# Patient Record
Sex: Male | Born: 1935 | Race: Black or African American | Hispanic: No | Marital: Married | Smoking: Former smoker
Health system: Southern US, Community
[De-identification: ages and names within clinical notes are randomized; demographics above are authoritative.]

## PROBLEM LIST (undated history)

## (undated) DIAGNOSIS — Z86010 Personal history of colonic polyps: Secondary | ICD-10-CM

## (undated) DIAGNOSIS — R4189 Other symptoms and signs involving cognitive functions and awareness: Secondary | ICD-10-CM

## (undated) DIAGNOSIS — R339 Retention of urine, unspecified: Secondary | ICD-10-CM

## (undated) DIAGNOSIS — K7689 Other specified diseases of liver: Secondary | ICD-10-CM

## (undated) DIAGNOSIS — R7989 Other specified abnormal findings of blood chemistry: Secondary | ICD-10-CM

## (undated) DIAGNOSIS — G4730 Sleep apnea, unspecified: Secondary | ICD-10-CM

## (undated) DIAGNOSIS — K824 Cholesterolosis of gallbladder: Secondary | ICD-10-CM

## (undated) DIAGNOSIS — I4510 Unspecified right bundle-branch block: Secondary | ICD-10-CM

## (undated) DIAGNOSIS — H269 Unspecified cataract: Secondary | ICD-10-CM

## (undated) DIAGNOSIS — E669 Obesity, unspecified: Secondary | ICD-10-CM

## (undated) DIAGNOSIS — K648 Other hemorrhoids: Secondary | ICD-10-CM

## (undated) DIAGNOSIS — A4150 Gram-negative sepsis, unspecified: Secondary | ICD-10-CM

## (undated) DIAGNOSIS — N400 Enlarged prostate without lower urinary tract symptoms: Secondary | ICD-10-CM

## (undated) DIAGNOSIS — N390 Urinary tract infection, site not specified: Secondary | ICD-10-CM

## (undated) DIAGNOSIS — I861 Scrotal varices: Secondary | ICD-10-CM

## (undated) DIAGNOSIS — F0390 Unspecified dementia without behavioral disturbance: Secondary | ICD-10-CM

## (undated) HISTORY — DX: Other symptoms and signs involving cognitive functions and awareness: R41.89

## (undated) HISTORY — PX: GLAUCOMA REPAIR: SHX214

## (undated) HISTORY — DX: Obesity, unspecified: E66.9

## (undated) HISTORY — DX: Gram-negative sepsis, unspecified: A41.50

## (undated) HISTORY — DX: Other specified abnormal findings of blood chemistry: R79.89

## (undated) HISTORY — PX: KNEE SURGERY: SHX244

## (undated) HISTORY — DX: Retention of urine, unspecified: R33.9

## (undated) HISTORY — DX: Unspecified cataract: H26.9

## (undated) HISTORY — DX: Cholesterolosis of gallbladder: K82.4

## (undated) HISTORY — DX: Other specified diseases of liver: K76.89

## (undated) HISTORY — PX: CATARACT EXTRACTION: SUR2

## (undated) HISTORY — DX: Sleep apnea, unspecified: G47.30

## (undated) HISTORY — DX: Unspecified right bundle-branch block: I45.10

## (undated) HISTORY — DX: Benign prostatic hyperplasia without lower urinary tract symptoms: N40.0

## (undated) HISTORY — DX: Unspecified dementia, unspecified severity, without behavioral disturbance, psychotic disturbance, mood disturbance, and anxiety: F03.90

## (undated) HISTORY — DX: Personal history of colonic polyps: Z86.010

## (undated) HISTORY — DX: Scrotal varices: I86.1

## (undated) HISTORY — DX: Urinary tract infection, site not specified: N39.0

## (undated) HISTORY — DX: Other hemorrhoids: K64.8

---

## 2004-04-22 ENCOUNTER — Ambulatory Visit: Payer: Self-pay | Admitting: Internal Medicine

## 2004-11-15 ENCOUNTER — Ambulatory Visit: Payer: Self-pay | Admitting: Internal Medicine

## 2004-12-29 ENCOUNTER — Ambulatory Visit: Payer: Self-pay | Admitting: Internal Medicine

## 2005-01-19 ENCOUNTER — Ambulatory Visit: Payer: Self-pay | Admitting: Internal Medicine

## 2005-02-09 ENCOUNTER — Ambulatory Visit: Payer: Self-pay | Admitting: Internal Medicine

## 2005-02-24 ENCOUNTER — Encounter (INDEPENDENT_AMBULATORY_CARE_PROVIDER_SITE_OTHER): Payer: Self-pay | Admitting: *Deleted

## 2005-02-24 ENCOUNTER — Ambulatory Visit: Payer: Self-pay | Admitting: Internal Medicine

## 2005-03-24 ENCOUNTER — Ambulatory Visit: Payer: Self-pay | Admitting: Internal Medicine

## 2005-06-23 ENCOUNTER — Ambulatory Visit: Payer: Self-pay | Admitting: Internal Medicine

## 2006-05-11 ENCOUNTER — Ambulatory Visit: Payer: Self-pay | Admitting: Internal Medicine

## 2006-05-11 LAB — CONVERTED CEMR LAB
ALT: 20 units/L (ref 0–40)
AST: 24 units/L (ref 0–37)
Albumin: 3.7 g/dL (ref 3.5–5.2)
Alkaline Phosphatase: 121 units/L — ABNORMAL HIGH (ref 39–117)
BUN: 15 mg/dL (ref 6–23)
Basophils Absolute: 0 10*3/uL (ref 0.0–0.1)
Basophils Relative: 0.2 % (ref 0.0–1.0)
Bilirubin Urine: NEGATIVE
CO2: 30 meq/L (ref 19–32)
Calcium: 9.2 mg/dL (ref 8.4–10.5)
Chloride: 105 meq/L (ref 96–112)
Chol/HDL Ratio, serum: 3.2
Cholesterol: 192 mg/dL (ref 0–200)
Creatinine, Ser: 1.2 mg/dL (ref 0.4–1.5)
Eosinophil percent: 2.1 % (ref 0.0–5.0)
GFR calc non Af Amer: 62 mL/min
Glomerular Filtration Rate, Af Am: 75 mL/min/{1.73_m2}
Glucose, Bld: 91 mg/dL (ref 70–99)
HCT: 39.3 % (ref 39.0–52.0)
HDL: 59.8 mg/dL (ref >39.0)
Hemoglobin, Urine: NEGATIVE
Hemoglobin: 13.2 g/dL (ref 13.0–17.0)
Ketones, ur: NEGATIVE mg/dL
LDL Cholesterol: 120 mg/dL — ABNORMAL HIGH (ref 0–99)
Leukocytes, UA: NEGATIVE
Lymphocytes Relative: 38.8 % (ref 12.0–46.0)
MCHC: 33.6 g/dL (ref 30.0–36.0)
MCV: 92.3 fL (ref 78.0–100.0)
Monocytes Absolute: 0.4 10*3/uL (ref 0.2–0.7)
Monocytes Relative: 11.4 % — ABNORMAL HIGH (ref 3.0–11.0)
Neutro Abs: 1.8 10*3/uL (ref 1.4–7.7)
Neutrophils Relative %: 47.5 % (ref 43.0–77.0)
Nitrite: NEGATIVE
PSA: 1.86 ng/mL (ref 0.10–4.00)
Platelets: 181 10*3/uL (ref 150–400)
Potassium: 4.7 meq/L (ref 3.5–5.1)
RBC: 4.26 M/uL (ref 4.22–5.81)
RDW: 12.4 % (ref 11.5–14.6)
Sodium: 141 meq/L (ref 135–145)
Specific Gravity, Urine: 1.01 (ref 1.000–1.03)
TSH: 1.38 microintl units/mL (ref 0.35–5.50)
Total Bilirubin: 1 mg/dL (ref 0.3–1.2)
Total Protein, Urine: NEGATIVE mg/dL
Total Protein: 7 g/dL (ref 6.0–8.3)
Triglyceride fasting, serum: 61 mg/dL (ref 0–149)
Urine Glucose: NEGATIVE mg/dL
Urobilinogen, UA: 0.2 (ref 0.0–1.0)
VLDL: 12 mg/dL (ref 0–40)
WBC: 3.8 10*3/uL — ABNORMAL LOW (ref 4.5–10.5)
pH: 6.5 (ref 5.0–8.0)

## 2006-07-20 ENCOUNTER — Ambulatory Visit: Payer: Self-pay | Admitting: Internal Medicine

## 2006-07-20 LAB — CONVERTED CEMR LAB
Folate: 12.6 ng/mL
Vitamin B-12: 264 pg/mL (ref 211–911)

## 2006-09-21 ENCOUNTER — Encounter: Admission: RE | Admit: 2006-09-21 | Discharge: 2006-09-21 | Payer: Self-pay | Admitting: *Deleted

## 2006-10-12 ENCOUNTER — Ambulatory Visit: Payer: Self-pay | Admitting: Internal Medicine

## 2007-03-22 ENCOUNTER — Ambulatory Visit: Payer: Self-pay | Admitting: Internal Medicine

## 2007-05-17 ENCOUNTER — Ambulatory Visit: Payer: Self-pay | Admitting: Internal Medicine

## 2007-05-17 LAB — CONVERTED CEMR LAB
ALT: 15 units/L (ref 0–53)
AST: 24 units/L (ref 0–37)
Albumin: 3.7 g/dL (ref 3.5–5.2)
Alkaline Phosphatase: 136 units/L — ABNORMAL HIGH (ref 39–117)
BUN: 10 mg/dL (ref 6–23)
Basophils Absolute: 0 10*3/uL (ref 0.0–0.1)
Basophils Relative: 1 % (ref 0.0–1.0)
Bilirubin Urine: NEGATIVE
CO2: 32 meq/L (ref 19–32)
CRP, High Sensitivity: <1 — ABNORMAL LOW (ref 0.00–5.00)
Calcium: 9.5 mg/dL (ref 8.4–10.5)
Chloride: 105 meq/L (ref 96–112)
Cholesterol: 205 mg/dL (ref 0–200)
Creatinine, Ser: 1.1 mg/dL (ref 0.4–1.5)
Direct LDL: 124.3 mg/dL
Eosinophils Absolute: 0.1 10*3/uL (ref 0.0–0.6)
Eosinophils Relative: 2.9 % (ref 0.0–5.0)
GFR calc Af Amer: 83 mL/min
GFR calc non Af Amer: 69 mL/min
Glucose, Bld: 99 mg/dL (ref 70–99)
HCT: 38.2 % — ABNORMAL LOW (ref 39.0–52.0)
HDL: 59.2 mg/dL (ref >39.0)
Hemoglobin, Urine: NEGATIVE
Hemoglobin: 12.9 g/dL — ABNORMAL LOW (ref 13.0–17.0)
Ketones, ur: NEGATIVE mg/dL
Leukocytes, UA: NEGATIVE
Lymphocytes Relative: 35.5 % (ref 12.0–46.0)
MCHC: 33.9 g/dL (ref 30.0–36.0)
MCV: 93.4 fL (ref 78.0–100.0)
Monocytes Absolute: 0.2 10*3/uL (ref 0.2–0.7)
Monocytes Relative: 7.1 % (ref 3.0–11.0)
Neutro Abs: 2 10*3/uL (ref 1.4–7.7)
Neutrophils Relative %: 53.5 % (ref 43.0–77.0)
Nitrite: NEGATIVE
Platelets: 160 10*3/uL (ref 150–400)
Potassium: 4.6 meq/L (ref 3.5–5.1)
RBC: 4.09 M/uL — ABNORMAL LOW (ref 4.22–5.81)
RDW: 12.4 % (ref 11.5–14.6)
Sodium: 142 meq/L (ref 135–145)
Specific Gravity, Urine: 1.015 (ref 1.000–1.03)
TSH: 1.35 microintl units/mL (ref 0.35–5.50)
Total Bilirubin: 0.9 mg/dL (ref 0.3–1.2)
Total CHOL/HDL Ratio: 3.5
Total Protein, Urine: NEGATIVE mg/dL
Total Protein: 7 g/dL (ref 6.0–8.3)
Triglycerides: 50 mg/dL (ref 0–149)
Urine Glucose: NEGATIVE mg/dL
Urobilinogen, UA: 0.2 (ref 0.0–1.0)
VLDL: 10 mg/dL (ref 0–40)
WBC: 3.5 10*3/uL — ABNORMAL LOW (ref 4.5–10.5)
pH: 6.5 (ref 5.0–8.0)

## 2007-06-04 ENCOUNTER — Telehealth (INDEPENDENT_AMBULATORY_CARE_PROVIDER_SITE_OTHER): Payer: Self-pay | Admitting: *Deleted

## 2007-06-11 ENCOUNTER — Encounter: Payer: Self-pay | Admitting: Internal Medicine

## 2007-06-11 DIAGNOSIS — D126 Benign neoplasm of colon, unspecified: Secondary | ICD-10-CM

## 2007-06-11 DIAGNOSIS — N400 Enlarged prostate without lower urinary tract symptoms: Secondary | ICD-10-CM

## 2007-06-11 DIAGNOSIS — H409 Unspecified glaucoma: Secondary | ICD-10-CM

## 2007-06-11 DIAGNOSIS — K5730 Diverticulosis of large intestine without perforation or abscess without bleeding: Secondary | ICD-10-CM

## 2007-06-11 DIAGNOSIS — N401 Enlarged prostate with lower urinary tract symptoms: Secondary | ICD-10-CM

## 2007-06-11 DIAGNOSIS — F6811 Factitious disorder with predominantly psychological signs and symptoms: Secondary | ICD-10-CM

## 2007-06-11 DIAGNOSIS — I861 Scrotal varices: Secondary | ICD-10-CM

## 2007-06-11 DIAGNOSIS — I4510 Unspecified right bundle-branch block: Secondary | ICD-10-CM

## 2007-06-11 DIAGNOSIS — E669 Obesity, unspecified: Secondary | ICD-10-CM | POA: Insufficient documentation

## 2007-06-11 HISTORY — DX: Unspecified glaucoma: H40.9

## 2007-06-11 HISTORY — DX: Benign prostatic hyperplasia with lower urinary tract symptoms: N40.1

## 2007-06-11 HISTORY — DX: Unspecified right bundle-branch block: I45.10

## 2007-06-11 HISTORY — DX: Scrotal varices: I86.1

## 2007-06-11 HISTORY — DX: Benign neoplasm of colon, unspecified: D12.6

## 2007-06-11 HISTORY — DX: Factitious disorder imposed on self, with predominantly psychological signs and symptoms: F68.11

## 2007-06-11 HISTORY — DX: Diverticulosis of large intestine without perforation or abscess without bleeding: K57.30

## 2007-12-20 ENCOUNTER — Ambulatory Visit: Payer: Self-pay | Admitting: Internal Medicine

## 2007-12-20 LAB — CONVERTED CEMR LAB
Basophils Absolute: 0 10*3/uL (ref 0.0–0.1)
Basophils Relative: 0.6 % (ref 0.0–1.0)
Eosinophils Absolute: 0.1 10*3/uL (ref 0.0–0.7)
Eosinophils Relative: 2.2 % (ref 0.0–5.0)
HCT: 38.2 % — ABNORMAL LOW (ref 39.0–52.0)
Hemoglobin: 12.6 g/dL — ABNORMAL LOW (ref 13.0–17.0)
Lymphocytes Relative: 33.1 % (ref 12.0–46.0)
MCHC: 33 g/dL (ref 30.0–36.0)
MCV: 94.2 fL (ref 78.0–100.0)
Monocytes Absolute: 0.4 10*3/uL (ref 0.1–1.0)
Monocytes Relative: 11.6 % (ref 3.0–12.0)
Neutro Abs: 2 10*3/uL (ref 1.4–7.7)
Neutrophils Relative %: 52.5 % (ref 43.0–77.0)
Platelets: 167 10*3/uL (ref 150–400)
RBC: 4.06 M/uL — ABNORMAL LOW (ref 4.22–5.81)
RDW: 12.8 % (ref 11.5–14.6)
Vitamin B-12: 248 pg/mL (ref 211–911)
WBC: 3.8 10*3/uL — ABNORMAL LOW (ref 4.5–10.5)

## 2008-03-11 ENCOUNTER — Ambulatory Visit: Payer: Self-pay | Admitting: Pulmonary Disease

## 2008-05-22 ENCOUNTER — Ambulatory Visit: Payer: Self-pay | Admitting: Internal Medicine

## 2008-11-20 ENCOUNTER — Ambulatory Visit: Payer: Self-pay | Admitting: Internal Medicine

## 2008-11-20 DIAGNOSIS — H9190 Unspecified hearing loss, unspecified ear: Secondary | ICD-10-CM

## 2008-11-20 HISTORY — DX: Unspecified hearing loss, unspecified ear: H91.90

## 2009-03-26 ENCOUNTER — Ambulatory Visit: Payer: Self-pay | Admitting: Internal Medicine

## 2010-01-18 ENCOUNTER — Encounter (INDEPENDENT_AMBULATORY_CARE_PROVIDER_SITE_OTHER): Payer: Self-pay | Admitting: *Deleted

## 2010-02-03 ENCOUNTER — Ambulatory Visit: Payer: Self-pay | Admitting: Internal Medicine

## 2010-02-03 DIAGNOSIS — R5381 Other malaise: Secondary | ICD-10-CM

## 2010-02-03 DIAGNOSIS — R5383 Other fatigue: Secondary | ICD-10-CM

## 2010-02-03 HISTORY — DX: Other malaise: R53.81

## 2010-02-03 HISTORY — DX: Other fatigue: R53.83

## 2010-02-24 ENCOUNTER — Ambulatory Visit: Payer: Self-pay | Admitting: Internal Medicine

## 2010-02-24 ENCOUNTER — Encounter: Payer: Self-pay | Admitting: Internal Medicine

## 2010-02-24 LAB — CONVERTED CEMR LAB
ALT: 15 units/L (ref 0–53)
AST: 25 units/L (ref 0–37)
Albumin: 3.9 g/dL (ref 3.5–5.2)
Alkaline Phosphatase: 147 units/L — ABNORMAL HIGH (ref 39–117)
BUN: 18 mg/dL (ref 6–23)
Basophils Absolute: 0 10*3/uL (ref 0.0–0.1)
Basophils Relative: 0.6 % (ref 0.0–3.0)
Bilirubin Urine: NEGATIVE
Bilirubin, Direct: 0.1 mg/dL (ref 0.0–0.3)
CO2: 31 meq/L (ref 19–32)
Calcium: 9 mg/dL (ref 8.4–10.5)
Chloride: 104 meq/L (ref 96–112)
Cholesterol: 183 mg/dL (ref 0–200)
Creatinine, Ser: 1.1 mg/dL (ref 0.4–1.5)
Eosinophils Absolute: 0.1 10*3/uL (ref 0.0–0.7)
Eosinophils Relative: 1.8 % (ref 0.0–5.0)
GFR calc non Af Amer: 80.75 mL/min (ref >60)
Glucose, Bld: 86 mg/dL (ref 70–99)
HCT: 36.8 % — ABNORMAL LOW (ref 39.0–52.0)
HDL: 63.5 mg/dL (ref >39.00)
Hemoglobin, Urine: NEGATIVE
Hemoglobin: 12.4 g/dL — ABNORMAL LOW (ref 13.0–17.0)
Ketones, ur: NEGATIVE mg/dL
LDL Cholesterol: 113 mg/dL — ABNORMAL HIGH (ref 0–99)
Leukocytes, UA: NEGATIVE
Lymphocytes Relative: 28.3 % (ref 12.0–46.0)
Lymphs Abs: 1.2 10*3/uL (ref 0.7–4.0)
MCHC: 33.6 g/dL (ref 30.0–36.0)
MCV: 94.3 fL (ref 78.0–100.0)
Monocytes Absolute: 0.5 10*3/uL (ref 0.1–1.0)
Monocytes Relative: 12.9 % — ABNORMAL HIGH (ref 3.0–12.0)
Neutro Abs: 2.3 10*3/uL (ref 1.4–7.7)
Neutrophils Relative %: 56.4 % (ref 43.0–77.0)
Nitrite: NEGATIVE
Platelets: 157 10*3/uL (ref 150.0–400.0)
Potassium: 4.1 meq/L (ref 3.5–5.1)
RBC: 3.9 M/uL — ABNORMAL LOW (ref 4.22–5.81)
RDW: 13.9 % (ref 11.5–14.6)
Sodium: 141 meq/L (ref 135–145)
Specific Gravity, Urine: 1.025 (ref 1.000–1.030)
TSH: 2.06 microintl units/mL (ref 0.35–5.50)
Total Bilirubin: 0.7 mg/dL (ref 0.3–1.2)
Total CHOL/HDL Ratio: 3
Total Protein, Urine: NEGATIVE mg/dL
Total Protein: 6.8 g/dL (ref 6.0–8.3)
Triglycerides: 33 mg/dL (ref 0.0–149.0)
Urine Glucose: NEGATIVE mg/dL
Urobilinogen, UA: 0.2 (ref 0.0–1.0)
VLDL: 6.6 mg/dL (ref 0.0–40.0)
WBC: 4.1 10*3/uL — ABNORMAL LOW (ref 4.5–10.5)
pH: 5.5 (ref 5.0–8.0)

## 2010-04-13 ENCOUNTER — Encounter (INDEPENDENT_AMBULATORY_CARE_PROVIDER_SITE_OTHER): Payer: Self-pay | Admitting: *Deleted

## 2010-06-03 DIAGNOSIS — Z860100 Personal history of colon polyps, unspecified: Secondary | ICD-10-CM

## 2010-06-03 HISTORY — DX: Personal history of colon polyps, unspecified: Z86.0100

## 2010-06-03 HISTORY — DX: Personal history of colonic polyps: Z86.010

## 2010-06-14 ENCOUNTER — Encounter: Payer: Self-pay | Admitting: Internal Medicine

## 2010-06-14 ENCOUNTER — Other Ambulatory Visit: Payer: Self-pay

## 2010-06-14 ENCOUNTER — Ambulatory Visit
Admission: RE | Admit: 2010-06-14 | Discharge: 2010-06-14 | Payer: Self-pay | Source: Home / Self Care | Attending: Internal Medicine | Admitting: Internal Medicine

## 2010-06-14 DIAGNOSIS — R932 Abnormal findings on diagnostic imaging of liver and biliary tract: Secondary | ICD-10-CM | POA: Insufficient documentation

## 2010-06-14 DIAGNOSIS — K649 Unspecified hemorrhoids: Secondary | ICD-10-CM

## 2010-06-14 HISTORY — DX: Unspecified hemorrhoids: K64.9

## 2010-06-14 HISTORY — DX: Abnormal findings on diagnostic imaging of liver and biliary tract: R93.2

## 2010-06-14 LAB — IBC PANEL
Iron: 61 ug/dL (ref 42–165)
Saturation Ratios: 18.9 % — ABNORMAL LOW (ref 20.0–50.0)
Transferrin: 230.8 mg/dL (ref 212.0–360.0)

## 2010-06-14 LAB — ALKALINE PHOSPHATASE: Alkaline Phosphatase: 152 U/L — ABNORMAL HIGH (ref 39–117)

## 2010-06-14 LAB — GAMMA GT: GGT: 35 U/L (ref 7–51)

## 2010-06-15 LAB — PSA: PSA: 1.06 ng/mL (ref 0.10–4.00)

## 2010-06-15 LAB — VITAMIN B12: Vitamin B-12: 304 pg/mL (ref 211–911)

## 2010-06-20 ENCOUNTER — Encounter: Payer: Self-pay | Admitting: Internal Medicine

## 2010-06-21 ENCOUNTER — Ambulatory Visit
Admission: RE | Admit: 2010-06-21 | Discharge: 2010-06-21 | Payer: Self-pay | Source: Home / Self Care | Attending: Gastroenterology | Admitting: Gastroenterology

## 2010-06-21 ENCOUNTER — Ambulatory Visit
Admission: RE | Admit: 2010-06-21 | Discharge: 2010-06-21 | Payer: Self-pay | Source: Home / Self Care | Attending: Internal Medicine | Admitting: Internal Medicine

## 2010-06-21 ENCOUNTER — Encounter: Payer: Self-pay | Admitting: Internal Medicine

## 2010-06-24 ENCOUNTER — Telehealth (INDEPENDENT_AMBULATORY_CARE_PROVIDER_SITE_OTHER): Payer: Self-pay | Admitting: *Deleted

## 2010-06-24 LAB — CONVERTED CEMR LAB: Tissue Transglutaminase Ab, IgA: 8.3 units (ref <20)

## 2010-06-27 ENCOUNTER — Encounter: Payer: Self-pay | Admitting: Internal Medicine

## 2010-07-06 ENCOUNTER — Ambulatory Visit
Admission: RE | Admit: 2010-07-06 | Discharge: 2010-07-06 | Payer: Self-pay | Source: Home / Self Care | Attending: Internal Medicine | Admitting: Internal Medicine

## 2010-07-06 DIAGNOSIS — E538 Deficiency of other specified B group vitamins: Secondary | ICD-10-CM

## 2010-07-06 HISTORY — DX: Deficiency of other specified B group vitamins: E53.8

## 2010-07-10 LAB — CONVERTED CEMR LAB
ALT: 12 units/L (ref 0–53)
AST: 22 units/L (ref 0–37)
Albumin: 3.6 g/dL (ref 3.5–5.2)
Alkaline Phosphatase: 138 units/L — ABNORMAL HIGH (ref 39–117)
BUN: 15 mg/dL (ref 6–23)
Basophils Absolute: 0 10*3/uL (ref 0.0–0.1)
Basophils Relative: 1.3 % (ref 0.0–3.0)
Bilirubin Urine: NEGATIVE
Bilirubin, Direct: 0.1 mg/dL (ref 0.0–0.3)
CO2: 32 meq/L (ref 19–32)
CRP, High Sensitivity: <1 — ABNORMAL LOW (ref 0.00–5.00)
Calcium: 9.1 mg/dL (ref 8.4–10.5)
Chloride: 104 meq/L (ref 96–112)
Cholesterol: 211 mg/dL (ref 0–200)
Creatinine, Ser: 1.2 mg/dL (ref 0.4–1.5)
Direct LDL: 117 mg/dL
Eosinophils Absolute: 0.1 10*3/uL (ref 0.0–0.7)
Eosinophils Relative: 2.8 % (ref 0.0–5.0)
GFR calc Af Amer: 75 mL/min
GFR calc non Af Amer: 62 mL/min
Glucose, Bld: 91 mg/dL (ref 70–99)
HCT: 39.1 % (ref 39.0–52.0)
HDL: 72 mg/dL (ref >39.0)
Hemoglobin, Urine: NEGATIVE
Hemoglobin: 13 g/dL (ref 13.0–17.0)
Ketones, ur: NEGATIVE mg/dL
Leukocytes, UA: NEGATIVE
Lymphocytes Relative: 41.4 % (ref 12.0–46.0)
MCHC: 33.3 g/dL (ref 30.0–36.0)
MCV: 94 fL (ref 78.0–100.0)
Monocytes Absolute: 0.4 10*3/uL (ref 0.1–1.0)
Monocytes Relative: 13.3 % — ABNORMAL HIGH (ref 3.0–12.0)
Neutro Abs: 1.1 10*3/uL — ABNORMAL LOW (ref 1.4–7.7)
Neutrophils Relative %: 41.2 % — ABNORMAL LOW (ref 43.0–77.0)
Nitrite: NEGATIVE
Platelets: 150 10*3/uL (ref 150–400)
Potassium: 4.3 meq/L (ref 3.5–5.1)
RBC: 4.15 M/uL — ABNORMAL LOW (ref 4.22–5.81)
RDW: 12.9 % (ref 11.5–14.6)
Sodium: 140 meq/L (ref 135–145)
Specific Gravity, Urine: 1.02 (ref 1.000–1.03)
TSH: 2.28 microintl units/mL (ref 0.35–5.50)
Total Bilirubin: 1 mg/dL (ref 0.3–1.2)
Total CHOL/HDL Ratio: 2.9
Total Protein, Urine: NEGATIVE mg/dL
Total Protein: 6.6 g/dL (ref 6.0–8.3)
Triglycerides: 45 mg/dL (ref 0–149)
Urine Glucose: NEGATIVE mg/dL
Urobilinogen, UA: 0.2 (ref 0.0–1.0)
VLDL: 9 mg/dL (ref 0–40)
WBC: 2.7 10*3/uL — ABNORMAL LOW (ref 4.5–10.5)
pH: 6.5 (ref 5.0–8.0)

## 2010-07-12 NOTE — Assessment & Plan Note (Signed)
Summary: folllow up/ mbw   Visit Type:  Follow-up PCP:  Wert  Chief Complaint:  6 month followup.  Pt denies having complaints..  History of Present Illness:  77 year old black male diagnosed with pseudodementia iin 2006 doing much better over the last year in general in terms of maintaining mental sharpness and memory  per family  wife says doing much better with memory and he denies any complaints at all. Able to tend to his garden fine. knows current events.     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)     OBESITY, MODERATE (ICD-278.00)    BUNDLE BRANCH BLOCK, RIGHT (ICD-426.4)    BENIGN PROSTATIC HYPERTROPHY, HX OF (ICD-V13.8)    VARICOCELE (ICD-456.4)    COLONIC POLYPS (ICD-211.3)    DIVERTICULOSIS OF COLON (ICD-562.10)    GLAUCOMA (ICD-365.9)        Family History:    Ht dz father, 56's    Ca, GI, mother  Social History:    Quit smoking 1958    married     Vital Signs:  Patient Profile:   77 Years Old Male Weight:      161.25 pounds O2 Sat:      96 % O2 treatment:    Room Air Temp:     97.7 degrees F oral Pulse rate:   76 / minute BP sitting:   106 / 64  (left arm)  Vitals Entered By: Vernie Murders (December 20, 2007 10:51 AM)                 Physical Exam    Ambulatory healthy appearing in no acute distress. Afeb with normal vital signs HEENT: nl dentition, turbinates, and orophanx. Nl external ear canals without cough reflex Neck without JVD/Nodes/TM Lungs clear to A and P bilaterally without cough on  insp or exp maneuvers RRR no s3 or murmur or increase in P2 Abd soft and benign with nl excursion in the supine position. No bruits or organomegaly Ext warm without calf tenderness, cyanosis clubbing or edema Skin warm and dry without lesions   neuro without def or pathologic reflexes      Impression & Recommendations:  Problem # 1:  PSEUDODEMENTIA (ICD-300.16) Was told B12 might be alittle low by neurologist but repeat 7/10 wnl so  rec Centrum A-Z  However, appears to be doing great.   F/u CPX 12/09   Orders: TLB-CBC Platelet - w/Differential (85025-CBCD) TLB-B12, Serum-Total ONLY (16109-U04) Est. Patient Level III (54098)    Patient Instructions: 1)  CPX 12/09   ]

## 2010-07-12 NOTE — Letter (Signed)
Summary: Colonoscopy-Changed to Office Visit Letter  [facility]  [address] [place], [place] [address]   Phone: [phone]  Fax: [phone]      January 18, 2010 MRN: [id number]   FARUQ ROSENBERGER [address] [place], [place]  [address]   Dear Mr. RORKE,   According to our records, it is time for you to schedule a Colonoscopy. However, after reviewing your medical record, I feel that an office visit would be most appropriate to more completely evaluate you and determine your need for a repeat procedure.  Please call [phone] (option #2) at your convenience to schedule an office visit. If you have any questions, concerns, or feel that this letter is in error, we would appreciate your call.   Sincerely,  Hedwig Morton. Juanda Chance, M.D.  [facility]astroenterology Division [phone]

## 2010-07-12 NOTE — Assessment & Plan Note (Signed)
Summary: Primary svc/ comprehensive eval    PCP:  Darrin Apodaca  Chief Complaint:  annual physical.......  History of Present Illness:  77 year old black male diagnosed with pseudodementia iin 2006 doing much better over the last year in general in terms of maintaining mental sharpness and memory  per family  wife confirms  much better with memory and he denies any complaints at all. Able to tend to his garden fine. knows current events.  May 22, 2008 co frequent urination good stream, drinking lots of tea late > pos nocturia, no dysuria, hesitance or dribbling.  Pt denies any significant sore throat, nasal congestion or excess secretions, fever, chills, sweats, unintended wt loss, pleuritic or exertional cp, orthopnea pnd or leg swelling.       Updated Prior Medication List: COSOPT 2-0.5 %  SOLN (DORZOLAMIDE-TIMOLOL) two times a day XALATAN 0.005 %  SOLN (LATANOPROST) 1 gtt once daily ALPHAGAN P 0.1 %  SOLN (BRIMONIDINE TARTRATE) 1 gtt two times a day MULTIVITAMINS   TABS (MULTIPLE VITAMIN) once daily ADULT ASPIRIN LOW STRENGTH 81 MG  TBDP (ASPIRIN) once daily CITRUCEL 500 MG  TABS (METHYLCELLULOSE (LAXATIVE)) once daily  Current Allergies: No known allergies   Past Medical History:    PSEUDODEMENTIA (ICD-300.16)     OBESITY, MODERATE (ICD-278.00)    BUNDLE BRANCH BLOCK, RIGHT (ICD-426.4)    BENIGN PROSTATIC HYPERTROPHY, HX OF (ICD-V13.8)    VARICOCELE (ICD-456.4), left    COLONIC POLYPS (ICD-211.3)        - Colonoscopy 02/24/05    DIVERTICULOSIS OF COLON (ICD-562.10)    GLAUCOMA (ICD-365.9)....................................................................Marland KitchenDavanzo, HP    HEALTH MAINTENANCE.....................................................................Marland KitchenWert        - Dt  4/03        - Pneumovax 4/02        - CPX May 22, 2008    Family History:    Reviewed history from 12/20/2007 and no changes required:       Ht dz father, 73's       Ca, GI, mother  Social  History:    Reviewed history from 12/20/2007 and no changes required:       Quit smoking 1958       married    Review of Systems  The patient denies anorexia, fever, weight loss, weight gain, vision loss, decreased hearing, hoarseness, chest pain, syncope, dyspnea on exertion, peripheral edema, prolonged cough, headaches, hemoptysis, abdominal pain, melena, hematochezia, severe indigestion/heartburn, hematuria, incontinence, muscle weakness, suspicious skin lesions, transient blindness, difficulty walking, depression, unusual weight change, abnormal bleeding, enlarged lymph nodes, angioedema, and breast masses.     Vital Signs:  Patient Profile:   77 Years Old Male Height:     68 inches Weight:      170.38 pounds BMI:     26.00 O2 Sat:      99 % O2 treatment:    Room Air Temp:     97.5 degrees F oral Pulse rate:   57 / minute BP sitting:   120 / 70  (left arm) Cuff size:   regular  Pt. in pain?   no  Vitals Entered By: Clarise Cruz Duncan Dull) (May 22, 2008 9:12 AM)                  Physical Exam    Ambulatory healthy appearing bm in no acute distress. Afeb with normal vital signs  wt 161 > 170 05/22/08 HEENT: nl dentition, turbinates, and orophanx. Nl external ear canals without cough reflex Neck without JVD/Nodes/TM Lungs clear  to A and P bilaterally without cough on insp or exp maneuvers RRR no s3 or murmur or increase in P2 Abd soft and benign with nl excursion in the supine position. No bruits or organomegaly Ext warm without calf tenderness, cyanosis clubbing or edema Skin warm and dry without lesions   neuro without def or pathologic reflexes, knows current events, date GU large left vericocele, no IH Rectal mild bph, smooth no nodules, stool guaiac neg    Pulmonary Function Test Height (in.): 68 Gender: Male    Impression & Recommendations:  Problem # 1:  PSEUDODEMENTIA (ICD-300.16) No evidence of cognitive decline,  encouraged to continue to  keep active phyically and mentally   Problem # 2:  BENIGN PROSTATIC HYPERTROPHY, HX OF (ICD-V13.8) no evidence of significant urinary obstruction or renal insufficiency. suspect his nocturia is secondary to too much tea in evenings, try off.   Problem # 3:  BUNDLE BRANCH BLOCK, RIGHT (ICD-426.4)  Benign pattern, no change   Problem # 4:  COLONIC POLYPS (ICD-211.3) in computer for recall   Patient Instructions: 1)  call phone tree next week for lab results 2)  Please schedule a follow-up appointment in 6 months.   ]

## 2010-07-12 NOTE — Letter (Signed)
Summary: New Patient letter  [facility]  [address] [place], [place] [address]   Phone: [phone]  Fax: [phone]       04/13/2010 MRN: [id number]  [name] [address] [place], [place]  [address]  Dear [name],  Welcome to the Gastroenterology Division at [facility].    You are scheduled to see Dr.  Juanda Chance on 06-14-10 at 2:45p.m. on the 3rd floor at [facility], [address]  We ask that you try to arrive at our office 15 minutes prior to your appointment time to allow for check-in.  We would like you to complete the enclosed self-administered evaluation form prior to your visit and bring it with you on the day of your appointment.  We will review it with you.  Also, please bring a complete list of all your medications or, if you prefer, bring the medication bottles and we will list them.  Please bring your insurance card so that we may make a copy of it.  If your insurance requires a referral to see a specialist, please bring your referral form from your primary care physician.  Co-payments are due at the time of your visit and may be paid by cash, check or credit card.     Your office visit will consist of a consult with your physician (includes a physical exam), any laboratory testing he/she may order, scheduling of any necessary diagnostic testing (e.g. x-ray, ultrasound, CT-scan), and scheduling of a procedure (e.g. Endoscopy, Colonoscopy) if required.  Please allow enough time on your schedule to allow for any/all of these possibilities.    If you cannot keep your appointment, please call [phone] to cancel or reschedule prior to your appointment date.  This allows [place] the opportunity to schedule an appointment for another patient in need of care.  If you do not cancel or reschedule by 5 p.m. the business day prior to your appointment date, you will be charged a $50.00 late cancellation/no-show fee.    Thank you for choosing [facility] for your medical needs.  We appreciate the opportunity to care for you.  Please visit [place] at our website  to learn more about our practice.                     Sincerely,                                                             The Gastroenterology Division

## 2010-07-12 NOTE — Assessment & Plan Note (Signed)
Summary: flu shot/ mbw   Flu Vaccine Consent Questions     Do you have a history of severe allergic reactions to this vaccine? no    Any prior history of allergic reactions to egg and/or gelatin? no    Do you have a sensitivity to the preservative Thimersol? no    Do you have a past history of Guillan-Barre Syndrome? no    Do you currently have an acute febrile illness? no    Have you ever had a severe reaction to latex? no    Vaccine information given and explained to patient? yes    Are you currently pregnant? no    Lot Number:[id number]   Site Given  Left Deltoid IM Boone Master CNA  March 16, 2008 6:05 PM     Nurse Visit    Prior Medications: COSOPT 2-0.5 %  SOLN (DORZOLAMIDE-TIMOLOL) two times a day XALATAN 0.005 %  SOLN (LATANOPROST) 1 gtt once daily ALPHAGAN P 0.1 %  SOLN (BRIMONIDINE TARTRATE) 1 gtt two times a day MULTIVITAMINS   TABS (MULTIPLE VITAMIN) once daily ADULT ASPIRIN LOW STRENGTH 81 MG  TBDP (ASPIRIN) once daily CITRUCEL 500 MG  TABS (METHYLCELLULOSE (LAXATIVE)) once daily Current Allergies: No known allergies     Orders Added: 1)  Admin 1st Vaccine [90471] 2)  Flu Vaccine 69yrs + [place].Dew    ]

## 2010-07-12 NOTE — Assessment & Plan Note (Signed)
Summary: Primary svc/  f/u fatigue, start trazadone at hs/ chedule cpx   Primary [name]/Referring [name]:  [name]  CC:  Followup.  [name] poor appetite x 6 months.  Also c/o feeling weak and fatigued. [name]  History of Present Illness: 39 yobm diagnosed with pseudodementia iin 2006  which complelely resolved  November 21, 2008  wife confirms  much better with memory and he denies any complaints at all. Able to tend to his garden fine. knows current events. No tia or claudication, concerned re possible hearing loss, no tinnitus or ear pain or sinus congestion.   February 03, 2010 Followup.  [name] poor appetite x 6 months.  Also c/o feeling weak and fatigued.  Sleeping all day, denies not sleeping well. Pt denies any significant sore throat, dysphagia, itching, sneezing,  nasal congestion or excess secretions,  fever, chills, sweats, unintended wt loss, pleuritic or exertional cp, hempoptysis, change in activity tolerance  orthopnea pnd or leg swelling   Current Medications (verified): 1)  Cosopt 2-0.5 %  Soln (Dorzolamide-Timolol) .... Two Times A Day 2)  Alphagan P 0.1 %  Soln (Brimonidine Tartrate) .[name].. 1 Gtt Two Times A Day  Allergies (verified): No Known Drug Allergies  Past History:  Past Medical History: PSEUDODEMENTIA (ICD-300.16)      - Onset 2006, resolved OBESITY, MODERATE (ICD-278.00) BUNDLE BRANCH BLOCK, RIGHT (ICD-426.4) BENIGN PROSTATIC HYPERTROPHY, HX OF (ICD-V13.8) VARICOCELE (ICD-456.4), left COLONIC POLYPS (ICD-211.3)     - Colonoscopy 02/24/05 DIVERTICULOSIS OF COLON (ICD-562.10) HEARING LOSS    - Audiology eval ordered November 20, 2008 > GLAUCOMA (ICD-365.9)....................................................................Dave KitchenDavanzo, HP HEALTH MAINTENANCE.......................................................................Dave KitchenWert     - Dt  4/03     - Pneumovax 09/2000  (age 89)     - CPX May 22, 2008   Vital Signs:  Patient profile:   77 year old male Weight:       155 pounds BMI:     23.65 O2 Sat:      95 % on Room air Temp:     97.8 degrees F oral Pulse rate:   56 / minute BP sitting:   118 / 70  (left arm)  Vitals Entered By: Vernie Murders (February 03, 2010 10:47 AM)  O2 Flow:  Room air  Physical Exam  Additional Exam:   Ambulatory healthy appearing bm in no acute distress. Afeb with normal vital signs  wt 161 > 170 05/22/08 > 154  November 20, 2008 > 155 February 03, 2010  HEENT: nl dentition, turbinates, and orophanx. Nl external ear canals without cough reflex, nl Marena Chancy and [name] Neck without JVD/Nodes/TM Lungs clear to A and P bilaterally without cough on insp or exp maneuvers RRR no s3 or murmur or increase in P2 Abd soft and benign with nl excursion in the supine position. 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 See instructions for specific recommendations   Medications Added to Medication List This Visit: 1)  Trazodone Hcl 50 Mg Tabs (Trazodone hcl) .... One at bedtime  Other Orders: Est. Patient  Level III (60454) Prescription Created Electronically [phone]) Est. Patient Level III (91478)  Patient Instructions: 1)  Trazadone 50 mg one  at bedtime (  take one half if too strong) 2)  Please schedule a follow-up appointment in 3 weeks, sooner if needed with CPX on return Prescriptions: TRAZODONE HCL 50 MG TABS (TRAZODONE HCL) one at bedtime  #30 x 11   Entered and Authorized by:   Nyoka Cowden MD   Signed by:    Nyoka Cowden MD on 02/03/2010   Method used:   Electronically to        [facility] [phone]* (retail)       [address]       [place], [place]  [address]       Ph: [phone]       Fax: [phone]   RxID:   [id number]

## 2010-07-12 NOTE — Assessment & Plan Note (Signed)
Summary: Primary svc/ better on trazadone   Primary Provider/Referring Provider:  [name]   History of Present Illness: 66 yobm diagnosed with pseudodementia iin 2006  which complelely resolved  November 21, 2008  wife confirms  much better with memory    February 03, 2010 Followup.  [name] poor appetite x 6 months.  Also c/o feeling weak and fatigued.  Sleeping all day, denies not sleeping well.  rec Trazadone 50 mg one  at bedtime (  take one half if too strong)  February 24, 2010 cpx no complaints, sleeping fine, more energy. Pt denies any significant sore throat, dysphagia, itching, sneezing,  nasal congestion or excess secretions,  fever, chills, sweats, unintended wt loss, pleuritic or exertional cp, hempoptysis, change in activity tolerance  orthopnea pnd or leg swelling          Current Medications (verified): 1)  Cosopt 2-0.5 %  Soln (Dorzolamide-Timolol) .... Two Times A Day 2)  Alphagan P 0.1 %  Soln (Brimonidine Tartrate) .Marland Kitchen.. 1 Gtt Two Times A Day 3)  Trazodone Hcl 50 Mg Tabs (Trazodone Hcl) .... One At Bedtime  Allergies (verified): No Known Drug Allergies  Past History:  Past Medical History: PSEUDODEMENTIA (ICD-300.16)      - Onset 2006, resolved OBESITY, MODERATE (ICD-278.00) BUNDLE BRANCH BLOCK, RIGHT (ICD-426.4) BENIGN PROSTATIC HYPERTROPHY, HX OF (ICD-V13.8) VARICOCELE (ICD-456.4), left COLONIC POLYPS (ICD-211.3)     - Colonoscopy 02/24/05 DIVERTICULOSIS OF COLON (ICD-562.10) HEARING LOSS    - Audiology eval ordered November 20, 2008 > GLAUCOMA (ICD-365.9)....................................................................Marland KitchenDavanzo, [facility]INTENA[facility]CE.......................................................................Marland KitchenWert     - Dt  4/03     - Pneumovax 09/2000  (age 22)     - CPX February 24, 2010   Family History: Ht dz father, 66's Ca, GI, mother Kidney dz Brother Has 5 borthers   Social History: Quit smoking 1958 married No  ETOH Retired  Vital Signs:  Patient profile:   77 year old male Weight:      155 pounds O2 Sat:      94 % on Room air Temp:     97.3 degrees F oral Pulse rate:   66 / minute BP sitting:   108 / 66  (left arm)  Vitals Entered By: Vernie Murders (February 24, 2010 8:55 AM)  O2 Flow:  Room air  Physical Exam  Additional Exam:  Ambulatory healthy appearing bm in no acute distress.  wt  170 05/22/08 > 154  November 20, 2008 > 155 February 03, 2010 > 155 February 24, 2010  HEENT: nl dentition, turbinates, and orophanx. Nl external ear canals without cough reflex, nl Marena Chancy and [name] Neck without JVD/Nodes/TM Lungs clear to A and P bilaterally without cough on insp or exp maneuvers RRR no s3 or murmur or increase in P2 Abd soft and benign with nl excursion in the supine position. No bruits or organomegaly Ext warm without calf tenderness, cyanosis clubbing or edema Skin warm and dry without lesions   neuro without def or pathologic reflexes, knows current events, date GU  uncirc, large varicocele Rectal mild/mod bph smooth texture, stool g neg   Cholesterol               183 mg/dL                   1-610     ATP III Classification            Desirable:  < 200 mg/dL  Borderline High:  200 - 239 mg/dL               High:  > = 240 mg/dL   Triglycerides             33.0 mg/dL                  2.1-308.6     Normal:  <150 mg/dL     Borderline High:  578 - 199 mg/dL   HDL                       46.96 mg/dL                 >29.52   VLDL Cholesterol          6.6 mg/dL                   8.4-13.2   LDL Cholesterol      [H]  440 mg/dL                   1-02  CHO/HDL Ratio:  CHD Risk                             3                    Men          Women     1/2 Average Risk     3.4          3.3     Average Risk          5.0          4.4     2X Average Risk          9.6          7.1     3X Average Risk          15.0          11.0                           Tests: (2) BMP  (METABOL)   Sodium                    141 mEq/L                   135-145   Potassium                 4.1 mEq/L                   3.5-5.1   Chloride                  104 mEq/L                   96-112   Carbon Dioxide            31 mEq/L                    19-32   Glucose                   86 mg/dL                    72-53   BUN  18 mg/dL                    1-61   Creatinine                1.1 mg/dL                   0.9-6.0   Calcium                   9.0 mg/dL                   4.5-40.9   GFR                       80.75 mL/min                >60  Tests: (3) CBC Platelet w/Diff (CBCD)   White Cell Count     [L]  4.1 K/uL                    4.5-10.5   Red Cell Count       [L]  3.90 Mil/uL                 4.22-5.81   Hemoglobin           [L]  12.4 g/dL                   81.1-91.4   Hematocrit           [L]  36.8 %                      39.0-52.0   MCV                       94.3 fl                     78.0-100.0   MCHC                      33.6 g/dL                   78.2-95.6   RDW                       13.9 %                      11.5-14.6   Platelet Count            157.0 K/uL                  150.0-400.0   Neutrophil %              56.4 %                      43.0-77.0   Lymphocyte %              28.3 %                      12.0-46.0   Monocyte %           [H]  12.9 %                      3.0-12.0   Eosinophils%  1.8 %                       0.0-5.0   Basophils %               0.6 %                       0.0-3.0   Neutrophill Absolute      2.3 K/uL                    1.4-7.7   Lymphocyte Absolute       1.2 K/uL                    0.7-4.0   Monocyte Absolute         0.5 K/uL                    0.1-1.0  Eosinophils, Absolute                             0.1 K/uL                    0.0-0.7   Basophils Absolute        0.0 K/uL                    0.0-0.1  Tests: (4) Hepatic/Liver Function Panel (HEPATIC)   Total Bilirubin           0.7 mg/dL                    1.6-1.0   Direct Bilirubin          0.1 mg/dL                   9.6-0.4   Alkaline Phosphatase [H]  147 U/L                     39-117   AST                       25 U/L                      0-37   ALT                       15 U/L                      0-53   Total Protein             6.8 g/dL                    5.4-0.9   Albumin                   3.9 g/dL                    8.1-1.9  Tests: (5) TSH (TSH)   FastTSH                   2.06 uIU/mL                 0.35-5.50  Tests: (6) UDip Only (UDIP)   Color  LT. YELLOW       RANGE:  Yellow;Lt. Yellow   Clarity                   CLEAR                       Clear   Specific Gravity          1.025                       1.000 - 1.030   Urine Ph                  5.5                         5.0-8.0   Protein                   NEGATIVE                    Negative   Urine Glucose             NEGATIVE                    Negative   Ketones                   NEGATIVE                    Negative   Urine Bilirubin           NEGATIVE                    Negative   Blood                     NEGATIVE                    Negative   Urobilinogen              0.2                         0.0 - 1.0   Leukocyte Esterace        NEGATIVE                    Negative   Nitrite                   NEGATIVE                    Negative  CXR  Procedure date:  02/24/2010  Findings:      Findings: Mild hyperinflation of the lungs. Heart and mediastinal contours are within normal limits.  No focal opacities or effusions.  No acute bony abnormality.   IMPRESSION: Hyperinflation.  No active disease.  Impression & Recommendations:  Problem # 1:  FATIQUE AND MALAISE (ICD-780.79) Much better on trazadone which is improving sleep hygiene  Problem # 2:  BENIGN PROSTATIC HYPERTROPHY, HX OF (ICD-V13.8) mild to moderate without obst symptoms  Problem # 3:  VARICOCELE (ICD-456.4) no change over the last year, gu eval as needed  Problem # 4:   BUNDLE BRANCH BLOCK, RIGHT (ICD-426.4) no change   Medications Added to Medication List This Visit: 1)  Trazodone Hcl 50 Mg Tabs (Trazodone hcl) .... One half at bedtime  Other Orders: EKG w/ Interpretation (93000) Influenza Vaccine MCR (40981) Est. Patient 65& > (19147) T-2 View CXR (71020TC) TLB-Lipid Panel (80061-LIPID) TLB-BMP (Basic Metabolic Panel-BMET) (80048-METABOL) TLB-CBC Platelet - w/Differential (85025-CBCD) TLB-Hepatic/Liver Function Pnl (80076-HEPATIC) TLB-TSH (Thyroid Stimulating Hormone) (84443-TSH) TLB-Udip ONLY (81003-UDIP)  Patient Instructions: 1)  Stay on trazadone 50 mg one half at bedtime 2)  Call [phone] for your results w/in next 3 days - if there's something important  I feel you need to know,  I'll be in touch with you directly.    Immunizations Administered:  Influenza Vaccine # 1:    Vaccine Type: Fluvax MCR    Site: left deltoid    Mfr: GlaxoSmithKline    Dose: 0.5 ml    Route: IM    Given by: Zackery Barefoot CMA    Exp. Date: 12/10/2010    Lot #: [id number]    VIS given: 01/04/10 version given February 24, 2010.  Flu Vaccine Consent Questions:    Do you have a history of severe allergic reactions to this vaccine? no    Any prior history of allergic reactions to egg and/or gelatin? no    Do you have a sensitivity to the preservative Thimersol? no    Do you have a past history of Guillan-Barre Syndrome? no    Do you currently have an acute febrile illness? no    Have you ever had a severe reaction to latex? no    Vaccine information given and explained to patient? yes

## 2010-07-12 NOTE — Assessment & Plan Note (Signed)
Summary: flu vacc ///kp   Nurse Visit   Allergies: No Known Drug Allergies  Immunizations Administered:  Influenza Vaccine # 1:    Vaccine Type: Fluvax MCR    Site: left deltoid    Mfr: GlaxoSmithKline    Dose: 0.5 ml    Route: IM    Given by: Michel Bickers CMA    Exp. Date: 12/09/2009    Lot #: [id number]    VIS given: 01/03/07 version given March 26, 2009.  Flu Vaccine Consent Questions:    Do you have a history of severe allergic reactions to this vaccine? no    Any prior history of allergic reactions to egg and/or gelatin? no    Do you have a sensitivity to the preservative Thimersol? no    Do you have a past history of Guillan-Barre Syndrome? no    Do you currently have an acute febrile illness? no    Have you ever had a severe reaction to latex? no    Vaccine information given and explained to patient? yes  Orders Added: 1)  Influenza Vaccine MCR [00025]

## 2010-07-12 NOTE — Progress Notes (Signed)
Summary: CALLING FOR LAB RESULTS   Phone Note Call from Patient   Caller: Spouse Call For: WERT Summary of Call: CALLING FOR LAB RESULTS PATIENT'S CHART HAS BEEN REQUESTED  Initial call taken by: Rickard Patience,  June 04, 2007 10:55 AM  Follow-up for Phone Call        RESULTS ARE ON PHONE TREE.  INFO GIVEN TO PT Follow-up by: Vernie Murders,  June 04, 2007 11:48 AM

## 2010-07-14 NOTE — Letter (Signed)
Summary: [facility] Instructions  [facility]  [address] [place], [place] [address]   Phone: [phone]  Fax: [phone]       [name]    Nov 28, 1925    MRN: [id number]       Procedure Day /Date:Tuesday January 10th, 2012     Arrival Time: 12:30pm     Procedure Time: 1:30pm     Location of Procedure:                    _ x_  [facility] (4th Floor)    PREPARATION FOR COLONOSCOPY WITH MIRALAX  Starting 5 days prior to your procedure 06/16/10 do not eat nuts, seeds, popcorn, corn, beans, peas,  salads, or any raw vegetables.  Do not take any fiber supplements (e.g. Metamucil, Citrucel, and Benefiber). ____________________________________________________________________________________________________   THE DAY BEFORE YOUR PROCEDURE         DATE: 06/20/10 DAY: Monday  1   Drink clear liquids the entire day-NO SOLID FOOD  2   Do not drink anything colored red or purple.  Avoid juices with pulp.  No orange juice.  3   Drink at least 64 oz. (8 glasses) of fluid/clear liquids during the day to prevent dehydration and help the prep work efficiently.  CLEAR LIQUIDS INCLUDE: Water Jello Ice Popsicles Tea (sugar ok, no milk/cream) Powdered fruit flavored drinks Coffee (sugar ok, no milk/cream) Gatorade Juice: apple, white grape, white cranberry  Lemonade Clear bullion, consomm, broth Carbonated beverages (any kind) Strained chicken noodle soup Hard Candy  4   Mix the entire bottle of Miralax with 64 oz. of Gatorade/Powerade in the morning and put in the refrigerator to chill.  5   At 3:00 pm take 2 Dulcolax/Bisacodyl tablets.  6   At 4:30 pm take one Reglan/Metoclopramide tablet.  7  Starting at 5:00 pm drink one 8 oz glass of the Miralax mixture every 15-20 minutes until you have finished drinking the entire 64 oz.  You should finish drinking prep around 7:30 or 8:00 pm.  8   If you are nauseated, you may take the 2nd  Reglan/Metoclopramide tablet at 6:30 pm.        9    At 8:00 pm take 2 more DULCOLAX/Bisacodyl tablets.     THE DAY OF YOUR PROCEDURE      DATE:  06/21/10 DAY: Tuesday  You may drink clear liquids until 11:30am  (2 HOURS BEFORE PROCEDURE).   MEDICATION INSTRUCTIONS  Unless otherwise instructed, you should take regular prescription medications with a small sip of water as early as possible the morning of your procedure.        OTHER INSTRUCTIONS  You will need a responsible adult at least 75 years of age to accompany you and drive you home.   This person must remain in the waiting room during your procedure.  Wear loose fitting clothing that is easily removed.  Leave jewelry and other valuables at home.  However, you may wish to bring a book to read or an iPod/MP3 player to listen to music as you wait for your procedure to start.  Remove all body piercing jewelry and leave at home.  Total time from sign-in until discharge is approximately 2-3 hours.  You should go home directly after your procedure and rest.  You can resume normal activities the day after your procedure.  The day of your procedure you should not:   Drive   Make legal decisions  Operate machinery   Drink alcohol   Return to work  You will receive specific instructions about eating, activities and medications before you leave.   The above instructions have been reviewed and explained to me by   _______________________    I fully understand and can verbalize these instructions _____________________________ Date _______

## 2010-07-14 NOTE — Progress Notes (Signed)
   Phone Note Outgoing Call   Call placed by: Jesse Fall, RN Call placed to: Patient Summary of Call: Called patient with lab results. He wants to know does he need to set up for Vitamin B 12 inections? Please, advise. Initial call taken by: Jesse Fall RN,  June 24, 2010 10:26 AM  Follow-up for Phone Call        B12 level 304 pg, low normal. Please start B12 1000ug IM monthly x 6, then repeat B12 level. Follow-up by: Hart Carwin MD,  June 24, 2010 12:44 PM  Additional Follow-up for Phone Call Additional follow up Details #1::        Spoke with patient's wfie scheduled for  B 12 injection on 06/29/10 at 10:15 AM. Repeat labs on 11/21/10 labs in [facility]. Flag to remind me in computer Additional Follow-up by: Jesse Fall RN,  June 24, 2010 2:09 PM

## 2010-07-14 NOTE — Letter (Signed)
Summary: Patient Notice- Polyp Results  [facility]  [address] [place], [place] [address]   Phone: [phone]  Fax: [phone]        June 27, 2010 MRN: [id number]    [name] [address] [place], [place]  [address]    Dear [name],  I am pleased to inform you that the colon polyp(s) removed during your recent colonoscopy was (were) found to be benign (no cancer detected) upon pathologic examination.The polyp was adenomatous .    Should you develop new or worsening symptoms of abdominal pain, bowel habit changes or bleeding from the rectum or bowels, please schedule an evaluation with either your primary care physician or with me.  Additional information/recommendations:  _x_ No further action with gastroenterology is needed at this time. Please      follow-up with your primary care physician for your other healthcare      needs.  __ Please call [phone] to schedule a return visit to review your      situation.  __ Please keep your follow-up visit as already scheduled.  __ Continue treatment plan as outlined the day of your exam.  Please call us if you are having persistent problems or have questions about your condition that have not been fully answered at this time.  Sincerely,  Hart Carwin MD  This letter has been electronically signed by your physician.  Appended Document: Patient Notice- Polyp Results Letter Mailed

## 2010-07-14 NOTE — Assessment & Plan Note (Signed)
Summary: discuss colon//f/u--ch.    History of Present Illness Visit Type: new patient  Primary GI MD: [name] MD Primary Provider: Nyoka Cowden, MD  Requesting Provider: na Chief Complaint: Recall colon. Pt denies any GI complaints  History of Present Illness:   This is an 77 year old African American male who is here to discuss having a recall colonoscopy. He has a positive family history of gastrointestinal cancer in his mother. He has a personal history of villous adenoma and tubular adenoma on his last colonoscopy in September 2006. He is now having normal regular bowel habits without rectal bleeding. Other medical problems include a right bundle branch block, BPH and glaucoma. His appetite has been decreased and he has lost a significant amount of weight. He has early dementia. His wife takes care of all the decisions. She would like patient to have a colonoscopy. He has chronic elevation of alkaline phosphatase of unknown etiology with normal rest of the liver function tests.   GI Review of Systems      Denies abdominal pain, acid reflux, belching, bloating, chest pain, dysphagia with liquids, dysphagia with solids, heartburn, loss of appetite, nausea, vomiting, vomiting blood, weight loss, and  weight gain.         Current Medications (verified): 1)  Cosopt 2-0.5 %  Soln (Dorzolamide-Timolol) .... Two Times A Day 2)  Alphagan P 0.1 %  Soln (Brimonidine Tartrate) .Marland Kitchen.. 1 Gtt Two Times A Day 3)  Trazodone Hcl 50 Mg Tabs (Trazodone Hcl) .... One Half At Bedtime  Allergies (verified): No Known Drug Allergies  Past History:  Past Medical History: PSEUDODEMENTIA (ICD-300.16)      - Onset 2006, resolved OBESITY, MODERATE (ICD-278.00) BUNDLE BRANCH BLOCK, RIGHT (ICD-426.4) BENIGN PROSTATIC HYPERTROPHY, HX OF (ICD-V13.8) VARICOCELE (ICD-456.4), left COLONIC POLYPS (ICD-211.3)     - Colonoscopy 02/24/05 DIVERTICULOSIS OF COLON (ICD-562.10) HEARING LOSS    - Audiology eval  ordered November 20, 2008 > GLAUCOMA (ICD-365.9)....................................................................Marland KitchenDavanzo, [facility]INTENANCE.......................................................................Marland KitchenWert     - Dt  4/03     - Pneumovax 09/2000  (age 72)     - CPX February 24, 2010  Hemorrhoids--Internal   Past Surgical History: Unremarkable  Family History: Ht dz father, 93's Ca,  mother Kidney dz Brother Has 5 brothers  No FH of Colon Cancer:  Social History: Retired Married Childern Patient is a former smoker. -quite in 1958 Alcohol Use - no Illicit Drug Use - no Daily Caffeine Use: 2 daily   Review of Systems       The patient complains of hearing problems.  The patient denies allergy/sinus, anemia, anxiety-new, arthritis/joint pain, back pain, blood in urine, breast changes/lumps, change in vision, confusion, cough, coughing up blood, depression-new, fainting, fatigue, fever, headaches-new, heart murmur, heart rhythm changes, itching, menstrual pain, muscle pains/cramps, night sweats, nosebleeds, pregnancy symptoms, shortness of breath, skin rash, sleeping problems, sore throat, swelling of feet/legs, swollen lymph glands, thirst - excessive, urination - excessive, urination changes/pain, urine leakage, vision changes, and voice change.         Pertinent positive and negative review of systems were noted in the above HPI. All other ROS was otherwise negative.   Vital Signs:  Patient profile:   77 year old male Height:      68 inches Weight:      160 pounds BMI:     24.42 BSA:     1.86 Pulse rate:   60 / minute Pulse rhythm:   regular BP sitting:   120 /  64  (left arm) Cuff size:   regular  Vitals Entered By: Ok Anis CMA (June 14, 2010 3:06 PM)  Physical Exam  General:  Well developed, well nourished, no acute distress. Eyes:  PERRLA, no icterus. Neck:  no bruits. Lungs:  Clear throughout to auscultation. Heart:  Regular rate and rhythm;  no murmurs, rubs,  or bruits. Abdomen:  Soft  relaxed abdomen, nontender. Normoactive bowel sounds. Liver edge at costal margin. No distention. Rectal:  deferred because we are going to check his PSA level. Pulses:  Normal pulses noted. Extremities:  No clubbing, cyanosis, edema or deformities noted. Skin:  Intact without significant lesions or rashes. Psych:  Alert and cooperative. Normal mood and affect.   Impression & Recommendations:  Problem # 1:  COLONIC POLYPS, ADENOMATOUS, HX OF (ICD-V12.72)  Patient comes to discuss scheduling a colonoscopy despite his age of 26. He is otherwise in good health and his wife of what like him to have a colon exam. She has been uncertain family history all for gastrointestinal malignancy.  Orders: Colonoscopy (Colon)  Problem # 2:  HEALTH MAINTENANCE EXAM (ICD-V70.0) Patient has chronic elevation of alkaline phosphatase, borderline low B12 level and hemoglobin level. We will obtain iron studies as well as B12 level today and fractionate his alkaline phosphatase into bile versus liver origin. We will also check his PSA level.  Problem # 3:  OBESITY, MODERATE (ICD-278.00) Patient is no longer obese.  Other Orders: TLB-GGT (Gamma GT) (82977-GGT) TLB-B12, Serum-Total ONLY (82607-B12) TLB-IBC Pnl (Iron/FE;Transferrin) (83550-IBC) TLB-PSA (Prostate Specific Antigen) (84153-PSA) TLB-Phosphatase, Alkaline (84075-ALP)  Patient Instructions: 1)  We will schedule colonoscopy with completing colonoscopy prep. Fractionate alkaline phosphatase 2)  Your physician requests that you go to the basement floor of our office to have the following labwork completed before leaving today: B12 and iron studies, PSA level. 3)  Copy sent to : Dr [name] 4)  The medication list was reviewed and reconciled.  All changed / newly prescribed medications were explained.  A complete medication list was provided to the patient / caregiver. Prescriptions: DULCOLAX 5 MG  TBEC  (BISACODYL) Day before procedure take 2 at 3pm and 2 at 8pm.  #4 x 0   Entered by:   Lamona Curl CMA (AAMA)   Authorized by:   Hart Carwin MD   Signed by:   Lamona Curl CMA (AAMA) on 06/14/2010   Method used:   Electronically to        [facility] [phone]* (retail)       [address]       [place], [place]  [address]       Ph: [phone] or [phone]       Fax: [phone]   RxID:   [id number] REGLAN 10 MG  TABS (METOCLOPRAMIDE HCL) As per prep instructions.  #2 x 0   Entered by:   Lamona Curl CMA (AAMA)   Authorized by:   Hart Carwin MD   Signed by:   Lamona Curl CMA (AAMA) on 06/14/2010   Method used:   Electronically to        [facility] [phone]* (retail)       [address]       [place], [place]  [address]       Ph: [phone] or [phone]       Fax: [phone]   RxID:  [id number] MIRALAX   POWD (POLYETHYLENE GLYCOL 3350) As per prep  instructions.  #255gm x 0   Entered by:   Lamona Curl CMA (AAMA)   Authorized by:   Hart Carwin MD   Signed by:   Lamona Curl CMA (AAMA) on 06/14/2010   Method used:   Electronically to        [facility] [phone]* (retail)       [address]       [place], [place]  [address]       Ph: [phone] or [phone]       Fax: [phone]   RxID:   [id number]

## 2010-07-14 NOTE — Procedures (Signed)
Summary: COLON/bx   Colonoscopy  Procedure date:  02/24/2005  Findings:      Location:  [facility].    Procedures Next Due Date:    Colonoscopy: 02/2010 Patient Name: [name], [name] MRN:  Procedure Procedures: Colonoscopy CPT: [phone].    with polypectomy. CPT: [id number].  Personnel: Endoscopist: Dearis Danis L. Juanda Chance, MD.  Referred By: Sandrea Hughs, MD.  Exam Location: Exam performed in Outpatient Clinic. Outpatient  Patient Consent: Procedure, Alternatives, Risks and Benefits discussed, consent obtained, from patient. Consent was obtained by the RN.  Indications  Average Risk Screening Routine.  History  Current Medications: Patient is not currently taking Coumadin.  Pre-Exam Physical: Performed Feb 24, 2005. Entire physical exam was normal.  Exam Exam: Extent of exam reached: Cecum, extent intended: Cecum.  The cecum was identified by appendiceal orifice and IC valve. Colon retroflexion performed. Images taken. ASA Classification: II. Tolerance: good.  Monitoring: Pulse and BP monitoring, Oximetry used. Supplemental O2 given.  Colon Prep Used Miralax for colon prep. Prep results: good.  Sedation Meds: Patient assessed and found to be appropriate for moderate (conscious) sedation. Fentanyl 50 mcg. given IV. Versed 7 mg. given IV.  Findings - POLYP: Transverse Colon, Maximum size: 6 mm. pedunculated polyp. Distance from Anus 80 cm. Procedure:  snare without cautery, removed, retrieved, Polyp sent to pathology. ICD9: Colon Polyps: 211.3.  POLYP: Cecum, Maximum size: 10 mm. sessile polyp. Distance from Anus 70 cm. Procedure:  snare with cautery, removed, retrieved, sent to pathology. ICD9: Colon Polyps: 211.3.  - NORMAL EXAM: Cecum.  - DIVERTICULOSIS: Descending Colon to Sigmoid Colon. ICD9: Diverticulosis: 562.10. Comments: moderately severe.  - HEMORRHOIDS: Internal. Size: Grade I. ICD9: Hemorrhoids, Internal: 455.0.   Assessment Abnormal  examination, see findings above.  Diagnoses: 211.3: Colon Polyps.  562.10: Diverticulosis.  455.0: Hemorrhoids, Internal.   Comments: s/p 2 snare polypectomies Events  Unplanned Interventions: No intervention was required.  Unplanned Events: There were no complications. Plans  Post Exam Instructions: No aspirin or non-steroidal containing medications: 2 weeks.  Medication Plan: Await pathology.  Patient Education: Patient given standard instructions for: Yearly hemoccult testing recommended. Patient instructed to get routine colonoscopy every 5 years.  Disposition: After procedure patient sent to recovery. After recovery patient sent home.   This report was created from the original endoscopy report, which was reviewed and signed by the above listed endoscopist.    FINAL DIAGNOSIS    ***MICROSCOPIC EXAMINATION AND DIAGNOSIS***    1. COLON, POLYP: TUBULOVILLOUS ADENOMA. NO HIGH GRADE   DYSPLASIA OR MALIGNANCY IDENTIFIED (BIOPSIES, ASCENDING COLON AT   70 CM).    2. COLON, POLYP: TUBULAR ADENOMA. NO HIGH GRADE DYSPLASIA OR   MALIGNANCY IDENTIFIED (BIOPSIES, TRANSVERSE).    COMMENT   1. There is adenomatous epithelium having both tubular and   villous growth patterns consistent with a tubulovillous adenoma   if the biopsy is representative of the entire lesion. No high   grade dysplasia or evidence of malignancy is identified. Clinical   correlation is recommended.    2. There is adenomatous epithelium having a predominantly   tubular growth pattern consistent with a tubular adenoma if the   biopsy is representative of the entire lesion. No high grade   dysplasia or evidence of malignancy is identified. Clinical   correlation is recommended.    kv   Date Reported: 02/27/2005 Berneta Levins, MD   *** Electronically Signed Out By JAS ***    Clinical information   Screening R/O adenoma. (  jes)    specimen(s) obtained   1: Colon, polyp(s), 70 cm, ascending   2:  Colon, polyp(s), transverse    Gross Description   1. Received in formalin are tan, soft tissue fragments that are   submitted in toto. Number: multiple   Size: 0.3 to 0.9 cm one block    2. Received in formalin are tan, soft tissue fragments that are   submitted in toto. Number: 2   Size: 0.4 to 0.6 cm one block (BJ:jes, 02/27/05)    jes/

## 2010-07-14 NOTE — Procedures (Signed)
Summary: Colonoscopy  Patient: [name] Note: All result statuses are Final unless otherwise noted.  Tests: (1) Colonoscopy (COL)   COL Colonoscopy           DONE     [facility]     [address]     [place], [place]  [address]           COLONOSCOPY PROCEDURE REPORT           PATIENT:  [name], [name]  MR#:  [id number]     BIRTHDATE:  1925-08-27, 84 yrs. old  GENDER:  male     ENDOSCOPIST:  Hedwig Morton. Juanda Chance, MD     REF. BY:  Charlaine Dalton. [name], M.D.     PROCEDURE DATE:  06/21/2010     PROCEDURE:  Colonoscopy 19[address]     ASA CLASS:  Class III     INDICATIONS:  weight loss, history of pre-cancerous (adenomatous)     colon polyps mother with GI cancer     last colon 2006 with adenom. polyps x2, low Iron and B12 levels           MEDICATIONS:   Versed 4 mg, Fentanyl 50 mcg           DESCRIPTION OF PROCEDURE:   After the risks benefits and     alternatives of the procedure were thoroughly explained, informed     consent was obtained.  Digital rectal exam was performed and     revealed no rectal masses.   The LB [id number]-[id number] endoscope     was introduced through the anus and advanced to the cecum, which     was identified by both the appendix and ileocecal valve, without     limitations.  The quality of the prep was good, using MiraLax.     The instrument was then slowly withdrawn as the colon was fully     examined.     <<PROCEDUREIMAGES>>           FINDINGS:  A diminutive polyp was found in the sigmoid colon. 3 mm     sessile polyp at 10 cm The polyp was removed using cold biopsy     forceps (see image7).  Moderate diverticulosis was found     throughout the colon (see image5, image2, and image1).  This was     otherwise a normal examination of the colon (see image3, image4,     and image8).   Retroflexed views in the rectum revealed no     abnormalities.    The scope was then withdrawn from the patient     and the procedure completed.           COMPLICATIONS:   None     ENDOSCOPIC IMPRESSION:     1) Diminutive polyp in the sigmoid colon     2) Moderate diverticulosis throughout the colon     3) Otherwise normal examination     RECOMMENDATIONS:     1) Await biopsy results     sprue profile today, B12 and Iron supplements     REPEAT EXAM:  In 0 year(s) for.  no recall due to age           ______________________________     Hedwig Morton. Juanda Chance, MD           CC:  Charlaine Dalton. [name], M.D.           n.     eSIGNED:   Hedwig Morton.  Dane Bloch at 06/21/2010 02:15 PM           Sherin Quarry, [id number]  Note: An exclamation mark (!) indicates a result that was not dispersed into the flowsheet. Document Creation Date: 06/21/2010 2:15 PM _______________________________________________________________________  (1) Order result status: Final Collection or observation date-time: 06/21/2010 14:07 Requested date-time:  Receipt date-time:  Reported date-time:  Referring Physician:   Ordering Physician: [name] [phone]) Specimen Source:  Source: Launa Grill Order Number: [id number] Lab site:

## 2010-07-14 NOTE — Assessment & Plan Note (Signed)
Summary: Vit B 12 injection montly x6 #1/ Regina  Nurse Visit   Allergies: No Known Drug Allergies  Medication Administration  Injection # 1:    Medication: Vit B12 1000 mcg    Diagnosis: B12 DEFICIENCY (ICD-266.2)    Route: IM    Site: L deltoid    Exp Date: 11/13    Lot #: [id number]    Mfr: [employer] Regent    Comments: pt to schedule next monthly b12 #2 of 6 at front desk    Patient tolerated injection without complications    Given by: Chales Abrahams CMA Duncan Dull) (July 06, 2010 9:56 AM)  Orders Added: 1)  Vit B12 1000 mcg [J3420]

## 2010-08-02 ENCOUNTER — Encounter: Payer: Self-pay | Admitting: Adult Health

## 2010-08-02 ENCOUNTER — Ambulatory Visit (INDEPENDENT_AMBULATORY_CARE_PROVIDER_SITE_OTHER): Payer: Medicare Other | Admitting: Adult Health

## 2010-08-02 DIAGNOSIS — J069 Acute upper respiratory infection, unspecified: Secondary | ICD-10-CM

## 2010-08-04 DIAGNOSIS — J069 Acute upper respiratory infection, unspecified: Secondary | ICD-10-CM | POA: Insufficient documentation

## 2010-08-09 NOTE — Assessment & Plan Note (Signed)
Summary: per wife's call/cough x 3 weeks/congestion/mhh   Copy to:  na Primary Provider/Referring Provider:  Nyoka Cowden, MD   CC:  sneezing, chest/head congestion, and coughing - ocaas prod with clear mucus.  Chest tightness night.  Onset x 3 wks ago.  Denies f/c/s.Marland Kitchen  History of Present Illness: 52 yobm diagnosed with pseudodementia iin 2006  which complelely resolved  November 21, 2008  wife confirms  much better with memory    February 03, 2010 Followup.  [name] poor appetite x 6 months.  Also c/o feeling weak and fatigued.  Sleeping all day, denies not sleeping well.  rec Trazadone 50 mg one  at bedtime (  take one half if too strong)  February 24, 2010 cpx no complaints, sleeping fine, more energy. Pt denies any significant sore throat, dysphagia, itching, sneezing,  nasal congestion or excess secretions,  fever, chills, sweats, unintended wt loss, pleuritic or exertional cp, hempoptysis, change in activity tolerance  orthopnea pnd or leg swelling       August 02, 2010 --Presents for an acute office visit.Complains of sneezing, chest/head congestion, coughing - ocaas prod with clear mucus.  Chest tightness night.  Onset x 3 wks ago.  Taking cold med without help. Driainage causing tickle in throat.Worse at night. Cough is getting worse.. Denies chest pain, dyspnea, orthopnea, hemoptysis, fever, n/v/d, edema, headache.   Medications Prior to Update: 1)  Cosopt 2-0.5 %  Soln (Dorzolamide-Timolol) .... Two Times A Day 2)  Alphagan P 0.1 %  Soln (Brimonidine Tartrate) .Marland Kitchen.. 1 Gtt Two Times A Day 3)  Trazodone Hcl 50 Mg Tabs (Trazodone Hcl) .... One Half At Bedtime  Current Medications (verified): 1)  Cosopt 2-0.5 %  Soln (Dorzolamide-Timolol) .... 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)      - Onset 2006, resolved OBESITY, MODERATE (ICD-278.00) BUNDLE BRANCH BLOCK,  RIGHT (ICD-426.4) BENIGN PROSTATIC HYPERTROPHY, HX OF (ICD-V13.8) VARICOCELE (ICD-456.4), left COLONIC POLYPS (ICD-211.3)     - Colonoscopy 02/24/05 DIVERTICULOSIS OF COLON (ICD-562.10) HEARING LOSS    - Audiology eval ordered November 20, 2008 > GLAUCOMA (ICD-365.9)....................................................................Marland KitchenDavanzo, [facility].......................................................................Marland KitchenWert     - Dt  4/03     - Pneumovax 09/2000  (age 36)     - CPX February 24, 2010  Hemorrhoids--Internal   Past Surgical History: Last updated: 06/14/2010 Unremarkable  Family History: Last updated: 06/14/2010 Ht dz father, 59's Ca,  mother Kidney dz Brother Has 5 brothers  No FH of Colon Cancer:  Social History: Last updated: 06/14/2010 Retired Married Childern Patient is a former smoker. -quite in 1958 Alcohol Use - no Illicit Drug Use - no Daily Caffeine Use: 2 daily   Risk Factors: Smoking Status: quit (06/03/2010)  Review of Systems      See HPI  Vital Signs:  Patient profile:   77 year old male Height:      68 inches Weight:      160.25 pounds BMI:     24.45 O2 Sat:      94 % on Room air Temp:     96.7 degrees F oral Pulse rate:   55 / minute BP sitting:   128 / 70  (left arm) Cuff size:   regular  Vitals Entered By: Gweneth Dimitri RN (August 02, 2010 11:41 AM)  O2 Flow:  Room air CC: sneezing, chest/head congestion, coughing - ocaas prod with clear mucus.  Chest tightness night.  Onset x 3 wks ago.  Denies f/c/s. Is Patient Diabetic? No Comments Medications reviewed with patient Daytime contact number verified with patient. Gweneth Dimitri RN  August 02, 2010 11:41 AM    Physical Exam  Additional Exam:  Ambulatory healthy appearing bm in no acute distress.  wt  170 05/22/08 > 154  November 20, 2008 > 155 February 03, 2010 > 155 February 24, 2010 >160 08/02/10  HEENT: nl dentition, turbinates, and orophanx. Nl external  ear canals without cough reflex Neck without JVD/Nodes/TM Lungs clear to A and P bilaterally without cough on insp or exp maneuvers RRR no s3 or murmur or increase in P2 Abd soft and benign with nl excursion in the supine position. No bruits or organomegaly Ext warm without calf tenderness, cyanosis clubbing or edema Skin warm and dry without lesions   neuro intact w/no focal deficits noted.     Impression & Recommendations:  Problem # 1:  UPPER RESPIRATORY INFECTION (ICD-465.9)  Zpack take as directed  Mucinex DM two times a day as needed cough  Claritin 10mg  at bedtime for 5 days Fluids and rest  Please contact office for sooner follow up if symptoms do not improve or worsen   Orders: Est. Patient Level III ([address])  Medications Added to Medication List This Visit: 1)  Zithromax Z-pak 250 Mg Tabs (Azithromycin) .... Take as directed  Patient Instructions: 1)  Zpack take as directed  2)  Mucinex DM two times a day as needed cough  3)  Claritin 10mg  at bedtime for 5 days 4)  Fluids and rest  5)  Please contact office for sooner follow up if symptoms do not improve or worsen  Prescriptions: ZITHROMAX Z-PAK 250 MG TABS (AZITHROMYCIN) take as directed  #1 x 0   Entered and Authorized by:   Rubye Oaks NP   Signed by:   Rubye Oaks NP on 08/02/2010   Method used:   Electronically to        [facility] [phone]* (retail)       [address]       [place], [place]  [address]       Ph: [phone]       Fax: [phone]   RxID:   [id number]

## 2010-08-10 ENCOUNTER — Encounter (INDEPENDENT_AMBULATORY_CARE_PROVIDER_SITE_OTHER): Payer: Medicare Other

## 2010-08-10 ENCOUNTER — Encounter: Payer: Self-pay | Admitting: Internal Medicine

## 2010-08-10 DIAGNOSIS — E538 Deficiency of other specified B group vitamins: Secondary | ICD-10-CM

## 2010-08-18 NOTE — Assessment & Plan Note (Signed)
Summary: MONTHLY B12 SHOT  Nurse Visit   Allergies: No Known Drug Allergies  Medication Administration  Injection # 1:    Medication: Vit B12 1000 mcg    Diagnosis: B12 DEFICIENCY (ICD-266.2)    Route: IM    Site: R deltoid    Exp Date: 04/2012    Lot #: [id number]    Mfr: [employer] Regent    Comments: pt to schedule #3 of 6 monthly at front desk    Patient tolerated injection without complications    Given by: Chales Abrahams CMA Duncan Dull) (August 10, 2010 9:59 AM)  Orders Added: 1)  Vit B12 1000 mcg [J3420]

## 2010-09-14 ENCOUNTER — Ambulatory Visit (INDEPENDENT_AMBULATORY_CARE_PROVIDER_SITE_OTHER): Payer: Medicare Other | Admitting: Internal Medicine

## 2010-09-14 DIAGNOSIS — E538 Deficiency of other specified B group vitamins: Secondary | ICD-10-CM

## 2010-09-14 MED ORDER — CYANOCOBALAMIN 1000 MCG/ML IJ SOLN
1000.0000 ug | INTRAMUSCULAR | Status: AC
  Administered 2010-09-14 – 2010-12-20 (×4): 1000 ug via INTRAMUSCULAR

## 2010-10-19 ENCOUNTER — Ambulatory Visit (INDEPENDENT_AMBULATORY_CARE_PROVIDER_SITE_OTHER): Payer: Medicare Other | Admitting: Internal Medicine

## 2010-10-19 DIAGNOSIS — E538 Deficiency of other specified B group vitamins: Secondary | ICD-10-CM

## 2010-10-25 NOTE — Assessment & Plan Note (Signed)
[facility]                             PULMONARY OFFICE NOTE   NAME:[name], [name]                      MRN:          [id number]  DATE:05/17/2007                            DOB:          [birth year]    PRIMARY SERVICE COMPREHENSIVE HEALTH CARE EVALUATION   HISTORY:  A 77 year old black male diagnosed with pseudodementia in the  past year doing much better over the last year in general in terms of  maintaining minimal sharpness per family.  He denies any TIA or  claudication symptoms, morning headache, or urinary incontinence,  fevers, chills, sweats, unintended weight loss.   PAST MEDICAL HISTORY:  1. Significant for moderate obesity, resolved.  2. Right bundle branch block, asymptomatic.  3. BPH.  4. Left varicocele asymptomatic.  5. Diverticulosis with polyps and a computer re-call September 2009.  6. Glaucoma.   MEDICATIONS:  1. Citrucel 1 teaspoon daily.  2. Baby aspirin 1 daily.  3. Multivite 1 daily.   SOCIAL HISTORY:  He quit smoking 49 years ago.  He denies any alcohol  history.  He is fully retired and is still able to do yard work and all  of his usual activities of daily living.   FAMILY HISTORY:  Positive for heart disease in his father in his 3s.  GI cancer in his mother in her 41s.  One of his brothers died after  falling, but everyone else is healthy.   REVIEW OF SYSTEMS:  Taken in detail on the worksheet, negative except as  outlined above, except for frequent urination.  (Note, the patient has  stopped Flomax for reasons that are not clear.)   PHYSICAL EXAMINATION:  This is a pleasant ambulatory black male with  somewhat of a childlike affect, but very pleasant.  VITAL SIGNS:  Stable vital signs.  Blood pressure 128/76.  HEENT:  Unremarkable.  Oropharynx is clear.  Dentition is intact.  NECK:  Supple without cervical adenopathy, tenderness.  Trachea is  midline.  No thyromegaly.  Carotid upstrokes brisk without any  bruits.  LUNGS:  The lung fields are completely clear bilaterally to auscultation  and percussion.  CARDIAC:  Regular rate and rhythm without murmur, gallop, or rub.  ABDOMEN:  Soft and benign.  No palpable organomegaly, mass, or  tenderness.  No bruits.  EXTREMITIES:  Warm without calf tenderness, cyanosis, clubbing, or  edema.  Pedal pulses are slightly reduced in the dorsalis pedis  distribution.  NEUROLOGIC:  No focal deficits or pathologic reflexes.  He has 3/3  recall at 5 minutes.  RECTAL:  Revealed moderate BPH.  Smooth texture.  Stool guaiac was  negative.  GENITOURINARY:  He had a large left varicocele present.   LABORATORY DATA:  TSH was normal.  LDL cholesterol was 124.  CRP was  less than 1.  CMET was normal except for an alkaline phosphatase of 136.  CBC was normal.  Urinalysis reveals a normal specific gravity and was  completely unremarkable.  HDL cholesterol was 59.  EKG shows classic  right bundle branch pattern.   IMPRESSION:  1. Short-term memory loss has  resolved and is typical of      pseudodementia that may have been situational or related to      depression.  He seems very appropriate now, except for continued      mild child-like affect and attitude, but I believe this is his      baseline.  2. Right bundle branch block, asymptomatic.  3. Benign prostatic hypertrophy with intermittent urinary hesitancy.      He has no evidence of decreased renal function or urinary tract      infection.  It is optional whether or not he takes Flomax.  I will      recommend by phone tree that he continue taking if he is having any      increased hesitancy or consider referral to urology.  4. Asymptomatic left varicocele.  5. Diverticulosis with polyps in the computer for recall September      2009.  6. General health maintenance.  He was given a flu vaccination today.      Was updated on tetanus in 2003 and Pneumovax in 2002.  7. Followup will be every 3 months or sooner  if needed.  8. Glaucoma followup already arranged by [facility].     Charlaine Dalton. [name], MD, [facility]  Electronically Signed    MBW/MedQ  DD: 05/20/2007  DT: 05/20/2007  Job #: [id number]

## 2010-10-28 NOTE — Assessment & Plan Note (Signed)
[facility]                             PULMONARY OFFICE NOTE   MINH, ROANHORSE                      MRN:          [id number]  DATE:05/11/2006                            DOB:          1925-07-30    HISTORY:  A 77 year old black male with mild memory impairment evaluated  in July 2006 with a score of 28/30.  His wife has not noticed any  decline over the last year in his forgetfulness.  The patient himself  is not concerned about it.  He denies any headaches, ataxia, urinary  incontinence, exertional chest pain, orthopnea, PND, or leg swelling.   PAST MEDICAL HISTORY:  1. Moderate obesity.  2. Glaucoma  3. Diverticulosis with polyps (see most recent colonoscopy September      2006.  Scheduled for every three years).  4. Chronic right bundle branch block dating back to at least April      2003.   MEDICATIONS:  1. Citrucel one teaspoon.  2. Baby aspirin daily.  3. Multiple eye drops.  4. Multivitamins.   SOCIAL HISTORY:  He quit smoking 48 years ago.  He denies any alcohol  history.  He is fully retired. He is still able to do yard work and all  the activities of daily living.   FAMILY HISTORY:  Positive for heart disease in his father in his 72s.  Positive for GI cancer in his mother in her 18s.  One of his brothers  died after falling but everyone is healthy.   REVIEW OF SYSTEMS:  Taken in detail on the worksheet and negative except  as outlined above.   PHYSICAL EXAMINATION:  GENERAL:  This is a very pleasant to the point of  almost being childlike, ambulatory black male in no acute distress.  VITAL SIGNS:  Stable.  HEENT:  Oropharynx is clear.  Dentition is intact.  Ears canals clear.  NECK:  Supple without cervical adenopathy or tenderness.  Trachea is  midline.  No thyromegaly.  Carotids upstrokes are brisk without any  bruits.  CHEST:  Completely clear bilaterally to auscultation and percussion.  There was a nickel-sized  sebaceous cyst just above the xiphoid process  and also over his right scapula.  SKIN:  Otherwise skin exam was normal.  HEART:  Regular rhythm without murmur, gallop or rub.  __________ PMI.  ABDOMEN:  Soft with no palpable organomegaly, mass, or tenderness.  Femoral pulses were present bilaterally with no bruits.  GENITOURINARY:  Testes descended bilaterally.  He was uncircumcised.  He  had a left varicocele with no evidence of inguinal hernia.  RECTAL:  Moderate BPH. Smooth texture.  Stool guaiac negative.  EXTREMITIES:  Warm without calf tenderness, cyanosis or clubbing or  edema.  Pedal pulses were slightly down on the right versus the left as  in the past.  NEUROLOGIC:  No obvious motor or sensory problems with normal gait.  He  did have very subtle palmar  reflexes.  However, he could only recall  one object at five minutes even with prompting.   LABORATORY DATA:  CBC was normal. Chemistry  profile was unremarkable.  Cholesterol was 192 with an LDL of 120 and HDL of 60.  TSH and PSA were  normal.  Urinalysis was unremarkable.  EKG and chest x-ray unremarkable  except for right bundle branch block.   IMPRESSION:  1. This patient clearly has evidence of short-term memory loss with      somewhat of a child-like affect.  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  I am going to recommend      he return for a formal MMSE and consideration for a neurology      evaluation.  2. Right bundle branch block, asymptomatic.  3. Benign prostatic hypertrophy with no significant urinary hesitance.      No evidence of prostate cancer.  4. Left varicocele, asymptomatic.  5. Diverticulosis with polyps in the computer for recall September      2009.  6. General health maintenance.  Flu shot was given today.  His      Pneumovax was updated in 2002 and tetanus in 2003.  7. Asymptomatic sebaceous  cysts.  I have recommended excision if      recurrent infection becomes a problem.  8. Asymmetric pulses with arterial Dopplers in 2003.  No change on      exam or symptoms of claudication.  Therefore, no further followup      needed.     Charlaine Dalton. [name], MD, [facility]  Electronically Signed    MBW/MedQ  DD: 05/15/2006  DT: 05/15/2006  Job #: [id number]

## 2010-10-28 NOTE — Assessment & Plan Note (Signed)
[facility]                             PULMONARY OFFICE NOTE   LAWYER, WASHABAUGH                      MRN:          [id number]  DATE:10/12/2006                            DOB:          13-Jul-1925    This is a primary service/followup office visit on Oct 12, 2006.   HISTORY:  A 77 year old black male with question of memory loss  documented a year ago but on July 20, 2006, actually scored 30/30 and  subsequently was seen by Neurology, at the family's request, with no  obvious impairment functionally.  It was noted that he had borderline  B12 level and he was placed on Cerefolin NAC one daily and has been  doing well.  He denies any difficulty with short term memory and  recalled very well a conversation he had with Dr. Nash Shearer.  His only new  complaint is positional left shoulder discomfort that occurred after he  raked leaves.  He did not know what to take for this.  For a full  inventory of medications, please see Face Sheet, dated Oct 12, 2006.   PHYSICAL EXAMINATION:  He is an ambulatory black male with a bit of a  child-like affect and attitude.  He is afebrile with normal vital signs.  HEENT:  Remarkable for minimal retaining wax on the right ear, left ear  canal is clear.  Oropharynx is clear.  NECK:  Supple without cervical adenopathy or tenderness.  Trachea is  midline, no thyromegaly.  LUNG FIELDS:  Clear bilaterally to auscultation and percussion.  HEART:  Regular rate and rhythm without murmur, gallop or rub.  ABDOMEN:  Soft, benign.  EXTREMITIES:  Warm without any calf tenderness, cyanosis, clubbing,  edema.  LEFT SHOULDER:  Revealed minimal crepitance on external rotation but  full range of motion.   IMPRESSION:  1. No evidence of significant short term memory loss or a cognitive      impairment of any kind.  I suspect much of what we are seeing is a      personality issue in that he does behave in somewhat of a childlike  manner, but this apparently has always been the case.  2. He does have mild arthritis involving the left shoulder.  I have      recommended either Aleve or Advil taken with meals, per bottle      instructions, as needed, with followup by the family's orthopedist,      Dr. Noel Gerold, if not relieved, with the differential diagnosis      including partial rotator cuff injury.   FOLLOWUP:  Every 3 months, sooner if needed.     Charlaine Dalton. [name], MD, [facility]  Electronically Signed    MBW/MedQ  DD: 10/12/2006  DT: 10/12/2006  Job #: [id number]

## 2010-10-28 NOTE — Assessment & Plan Note (Signed)
HEALTHCARE                             PULMONARY OFFICE NOTE   MATH, BRAZIE                      MRN:          [id number]  DATE:07/20/2006                            DOB:          July 13, 1925    HISTORY OF PRESENT ILLNESS:  The patient is a 77 year old African-  American male patient of Dr. [name], who has a known history of glaucoma,  diverticulosis and colon polyps with mild memory impairment, initially  evaluated in July of 2006.  The patient returns today for a followup  mini-mental state examination.  The patient, in July of 2006, scored a  28 out of 30.  The patient has not noticed any decline over the last  year; however, continues to worry that he is, at times, somewhat  forgetful and has to repeat items quite frequently to her.  The patient  does not feel that his memory has decreased since the last evaluation.   Lab work had revealed a normal CBC, CMET, TSH and PSA level at his  physical in November of 2007.  The patient does not notice any visual  changes, (to note, the patient does have glaucoma), chest pain,  palpitations, extremity weakness, gait abnormalities.   PAST MEDICAL HISTORY/>  Glaucoma.  Diverticulosis with colon polyps.  Chronic right bundle  branch block.   CURRENT MEDICATIONS:  1. Citrucel daily.  2. Baby aspirin daily.  3. Multivitamin daily.  4. Cosopt eye drops b.i.d.  5. Xalatan 1 drop daily.  6. Alphagan 1 drop both eyes twice daily.   DRUG ALLERGIES:  No known drug allergies.   SOCIAL HISTORY:  The patient quit smoking 48 years ago.  Denies any  alcohol use.  Is retired.  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  He is  still very active and does his yard work.   FAMILY HISTORY:  Negative for Alzheimer's.  Positive for heart disease  in his father in his seventies.  Positive for GI cancer in his mother in  her eighties.  One of his brothers died after falling, but everyone else  is healthy.   REVIEW OF  SYSTEMS:  Essentially negative, except as noted above.   PHYSICAL EXAM:  The patient is a very pleasant black male, who is in no  acute distress.  VITAL SIGNS:  Stable.  O2 saturation is 96% on room air.  Blood pressure  is 112/78, weight is 165.  HEENT:  Unremarkable.  PERRLA.  EOMI without nystagmus.  Posterior  pharynx is clear.  TM are normal.  NECK:  Supple without cervical adenopathy.  No JVD.  Carotids are equal  with positive upstrokes bilaterally, without any bruits.  LUNG SOUNDS:  Clear to auscultation bilaterally.  CARDIAC:  Regular rate and rhythm without murmur, rub or gallop.  ABDOMEN:  Soft and nontender.  EXTREMITIES:  Warm without any calf cyanosis, clubbing or edema.  NEUROLOGIC:  Alert and oriented times three.  Cranial nerves II through  XII are intact.  Steady gait.  Negative Romberg.   DATA:  Mini-mental state exam scored a 30 out of 30.  Clock-drawing test  was normal.   IMPRESSION AND PLAN:  Mild memory impairment.  Patient completed his  mini-mental state exam today without difficulty.  The patient's score  actually improved since his last exam in July of 2006.  The patient has  been encouraged on brain exercises, for example, crossword puzzles.  Encouraged on a better sleep hygiene.  Continue on aspirin a day.  After  much discussion, the patient's family does wish to have a formal  neurological evaluation and we will make that referral to [facility]urology today and records will be faxed accordingly.  A B12, folate  and RPR test today are pending at time of dictation.      Rubye Oaks, NP  Electronically Signed      Charlaine Dalton. [name], MD, [facility]  Electronically Signed   TP/MedQ  DD: 07/20/2006  DT: 07/20/2006  Job #: [id number]

## 2010-11-16 ENCOUNTER — Ambulatory Visit (INDEPENDENT_AMBULATORY_CARE_PROVIDER_SITE_OTHER): Payer: Medicare Other | Admitting: Internal Medicine

## 2010-11-16 DIAGNOSIS — E538 Deficiency of other specified B group vitamins: Secondary | ICD-10-CM

## 2010-11-21 ENCOUNTER — Other Ambulatory Visit: Payer: Self-pay

## 2010-11-21 ENCOUNTER — Other Ambulatory Visit: Payer: Self-pay | Admitting: Internal Medicine

## 2010-11-21 DIAGNOSIS — E538 Deficiency of other specified B group vitamins: Secondary | ICD-10-CM

## 2010-11-22 ENCOUNTER — Telehealth: Payer: Self-pay | Admitting: *Deleted

## 2010-11-22 NOTE — Telephone Encounter (Signed)
Message copied by Daphine Deutscher on Tue Nov 22, 2010  9:23 AM ------      Message from: Daphine Deutscher      Created: Wed Aug 17, 2010 11:35 AM       Patient due for Vit B 12 level

## 2010-11-22 NOTE — Telephone Encounter (Signed)
Spoke with patient's wife and will have patient get his labs this week.

## 2010-12-01 ENCOUNTER — Other Ambulatory Visit (INDEPENDENT_AMBULATORY_CARE_PROVIDER_SITE_OTHER): Payer: Medicare Other

## 2010-12-01 DIAGNOSIS — E538 Deficiency of other specified B group vitamins: Secondary | ICD-10-CM

## 2010-12-01 LAB — VITAMIN B12: Vitamin B-12: 591 pg/mL (ref 211–911)

## 2010-12-20 ENCOUNTER — Ambulatory Visit (INDEPENDENT_AMBULATORY_CARE_PROVIDER_SITE_OTHER): Payer: Medicare Other | Admitting: Internal Medicine

## 2010-12-20 DIAGNOSIS — E538 Deficiency of other specified B group vitamins: Secondary | ICD-10-CM

## 2010-12-21 ENCOUNTER — Telehealth: Payer: Self-pay | Admitting: *Deleted

## 2010-12-21 DIAGNOSIS — E538 Deficiency of other specified B group vitamins: Secondary | ICD-10-CM

## 2010-12-21 NOTE — Telephone Encounter (Signed)
Message copied by Richardson Chiquito on Wed Dec 21, 2010  2:04 PM ------      Message from: Hart Carwin      Created: Wed Dec 21, 2010 12:42 PM      Regarding: B12       OK to stop B12, and recheck B12 level in 6 months.      ----- Message -----         From: Vernia Buff, CMA         Sent: 12/21/2010  10:46 AM           To: Hart Carwin, MD            Dr Juanda Chance-      Patient came for b12 injection yesterday. He would like to know if he needs to continue injections. His b12 level on 12/01/10 was 591.

## 2010-12-21 NOTE — Telephone Encounter (Signed)
Advised patient that per Dr Juanda Chance, he may discontinue b12 injections x 6 months at which time we will recheck his levels to see if he needs to restart the injections later. Patient verbalizes understanding.

## 2011-03-28 ENCOUNTER — Encounter: Payer: Self-pay | Admitting: Internal Medicine

## 2011-03-29 ENCOUNTER — Ambulatory Visit (INDEPENDENT_AMBULATORY_CARE_PROVIDER_SITE_OTHER): Payer: Medicare Other | Admitting: Internal Medicine

## 2011-03-29 ENCOUNTER — Encounter: Payer: Self-pay | Admitting: Internal Medicine

## 2011-03-29 ENCOUNTER — Other Ambulatory Visit (INDEPENDENT_AMBULATORY_CARE_PROVIDER_SITE_OTHER): Payer: Medicare Other

## 2011-03-29 VITALS — BP 118/78 | HR 58 | Temp 97.7°F | Ht 68.0 in | Wt 155.4 lb

## 2011-03-29 DIAGNOSIS — G4700 Insomnia, unspecified: Secondary | ICD-10-CM

## 2011-03-29 DIAGNOSIS — R5381 Other malaise: Secondary | ICD-10-CM

## 2011-03-29 DIAGNOSIS — R5383 Other fatigue: Secondary | ICD-10-CM

## 2011-03-29 DIAGNOSIS — Z23 Encounter for immunization: Secondary | ICD-10-CM

## 2011-03-29 DIAGNOSIS — Z87898 Personal history of other specified conditions: Secondary | ICD-10-CM

## 2011-03-29 HISTORY — DX: Insomnia, unspecified: G47.00

## 2011-03-29 LAB — BASIC METABOLIC PANEL
BUN: 17 mg/dL (ref 6–23)
CO2: 30 mEq/L (ref 19–32)
Calcium: 8.8 mg/dL (ref 8.4–10.5)
Chloride: 104 mEq/L (ref 96–112)
Creatinine, Ser: 1.2 mg/dL (ref 0.4–1.5)
GFR: 73.66 mL/min (ref >60.00)
Glucose, Bld: 83 mg/dL (ref 70–99)
Potassium: 4.1 mEq/L (ref 3.5–5.1)
Sodium: 138 mEq/L (ref 135–145)

## 2011-03-29 LAB — CBC WITH DIFFERENTIAL/PLATELET
Basophils Absolute: 0 10*3/uL (ref 0.0–0.1)
Basophils Relative: 0.9 % (ref 0.0–3.0)
Eosinophils Absolute: 0.1 10*3/uL (ref 0.0–0.7)
Eosinophils Relative: 1.9 % (ref 0.0–5.0)
HCT: 37.7 % — ABNORMAL LOW (ref 39.0–52.0)
Hemoglobin: 12.6 g/dL — ABNORMAL LOW (ref 13.0–17.0)
Lymphocytes Relative: 33.7 % (ref 12.0–46.0)
Lymphs Abs: 1.2 10*3/uL (ref 0.7–4.0)
MCHC: 33.5 g/dL (ref 30.0–36.0)
MCV: 93.8 fl (ref 78.0–100.0)
Monocytes Absolute: 0.4 10*3/uL (ref 0.1–1.0)
Monocytes Relative: 11.1 % (ref 3.0–12.0)
Neutro Abs: 1.9 10*3/uL (ref 1.4–7.7)
Neutrophils Relative %: 52.4 % (ref 43.0–77.0)
Platelets: 157 10*3/uL (ref 150.0–400.0)
RBC: 4.02 Mil/uL — ABNORMAL LOW (ref 4.22–5.81)
RDW: 14 % (ref 11.5–14.6)
WBC: 3.7 10*3/uL — ABNORMAL LOW (ref 4.5–10.5)

## 2011-03-29 LAB — URINALYSIS
Bilirubin Urine: NEGATIVE
Ketones, ur: NEGATIVE
Leukocytes, UA: NEGATIVE
Nitrite: NEGATIVE
Specific Gravity, Urine: 1.01 (ref 1.000–1.030)
Total Protein, Urine: NEGATIVE
Urine Glucose: NEGATIVE
Urobilinogen, UA: 0.2 (ref 0.0–1.0)
pH: 6 (ref 5.0–8.0)

## 2011-03-29 LAB — TSH: TSH: 1.93 u[IU]/mL (ref 0.35–5.50)

## 2011-03-29 MED ORDER — TRAZODONE HCL 50 MG PO TABS: ORAL_TABLET | ORAL | Status: DC

## 2011-03-29 NOTE — Assessment & Plan Note (Signed)
The problem of recurrent insomnia is discussed. Avoidance of caffeine sources is strongly encouraged. Sleep hygiene issues are reviewed.   Add back low dose trazadone, eliminate tea from diet (decaf ok in moderation)

## 2011-03-29 NOTE — Progress Notes (Signed)
Subjective:     Patient ID: [name], male   DOB: 02-09-1926, 77 y.o.   MRN: [id number]  HPI  34 yobm diagnosed with pseudodementia iin 2006 which complelely resolved  November 21, 2008  wife confirms much better with memory   February 03, 2010 Followup. [name] poor appetite x 6 months. Also c/o feeling weak and fatigued. Sleeping all day, denies not sleeping well. rec Trazadone 50 mg one at bedtime ( take one half if too strong)   03/29/2011 f/u ov/[name] cc feeling poorly x sev months, indolent onset while off trazadone poor sleep and fatigue,  waking up with nocturia x 3, drinkng lots of tea. Poor appetite but no gi complaints or documented wt loss. Gets to sleep ok but can't stay asleeep.  Does ok with tending his garden/ yard in terms of activity tolerance.  ROS  At present neg for  any significant sore throat, dysphagia, itching, sneezing,  nasal congestion or excess/ purulent secretions,  fever, chills, sweats, unintended wt loss, pleuritic or exertional cp, hempoptysis, orthopnea pnd or leg swelling.  Also denies presyncope, palpitations, heartburn, abdominal pain, nausea, vomiting, diarrhea  or change in bowel or urinary habits, dysuria,hematuria,  rash, arthralgias, visual complaints, headache, numbness weakness or ataxia.        Past Medical History:  PSEUDODEMENTIA (ICD-300.16)  - Onset 2006, resolved  On trazadone OBESITY, MODERATE (ICD-278.00)  BUNDLE BRANCH BLOCK, RIGHT (ICD-426.4)  BENIGN PROSTATIC HYPERTROPHY, HX OF (ICD-V13.8)  VARICOCELE (ICD-456.4), left  COLONIC POLYPS (ICD-211.3)  - Colonoscopy 02/24/05  DIVERTICULOSIS OF COLON (ICD-562.10)  HEARING LOSS  - Audiology eval ordered November 20, 2008  GLAUCOMA (ICD-365.9)....................................................................Marland KitchenDavanzo, [name]  HEALTH MAINTENANCE.......................................................................Marland KitchenWert  - Dt 09/2001 - Pneumovax 09/2000 (age 64)  - CPX February 24, 2010    Hemorrhoids--Internal      Family History:  Ht dz father, 71's  Ca, mother  Kidney dz Brother  Has 5 brothers  No FH of Colon Cancer:    Social History:  Retired  Married  Childern  Patient is a former smoker. -quit in 1958       Review of Systems     Objective:   Physical Exam    Ambulatory healthy appearing bm in no acute distress, affect somewhat juvenile.  wt 170 05/22/08 > 154 November 20, 2008 >  >160 08/02/10 > 155 03/29/2011  HEENT: nl dentition, turbinates, and orophanx. Nl external ear canals without cough reflex  Neck without JVD/Nodes/TM  Lungs clear to A and P bilaterally without cough on insp or exp maneuvers  RRR no s3 or murmur or increase in P2  Abd soft and benign with nl excursion in the supine position. No bruits or organomegaly  Ext warm without calf tenderness, cyanosis clubbing or edema  Skin warm and dry without lesions  neuro intact w/no focal deficits noted, good short term recall Assessment:         Plan:

## 2011-03-29 NOTE — Patient Instructions (Signed)
Avoidance of caffeine sources is strongly encouraged  Avoid bright lights after bedtime .   If not improving start back on Trazadone 50 mg one half at bedtime  Centrum A to Z  One daily  Please schedule a follow up visit in 3 months but call sooner if needed

## 2011-03-29 NOTE — Assessment & Plan Note (Signed)
Recurrent while off trazadone with nl bmet, tsh, cbc and no documented wt loss.    Will first try him back on low dose trazadone

## 2011-06-23 ENCOUNTER — Other Ambulatory Visit (INDEPENDENT_AMBULATORY_CARE_PROVIDER_SITE_OTHER): Payer: Medicare Other

## 2011-06-23 DIAGNOSIS — E538 Deficiency of other specified B group vitamins: Secondary | ICD-10-CM | POA: Diagnosis not present

## 2011-06-23 LAB — VITAMIN B12: Vitamin B-12: 348 pg/mL (ref 211–911)

## 2011-06-26 ENCOUNTER — Other Ambulatory Visit: Payer: Self-pay | Admitting: *Deleted

## 2011-06-26 ENCOUNTER — Telehealth: Payer: Self-pay | Admitting: *Deleted

## 2011-06-26 DIAGNOSIS — E538 Deficiency of other specified B group vitamins: Secondary | ICD-10-CM

## 2011-06-26 MED ORDER — CYANOCOBALAMIN 1000 MCG/ML IJ SOLN
1000.0000 ug | INTRAMUSCULAR | Status: AC
  Administered 2011-07-07: 1000 ug via INTRAMUSCULAR

## 2011-06-26 NOTE — Telephone Encounter (Signed)
Message copied by Daphine Deutscher on Mon Jun 26, 2011  2:34 PM ------      Message from: [place], [place] M      Created: Fri Jun 23, 2011 11:09 PM       Please call pt with low normal B12, give B12 1000ug IM monthly x6, repeat B12

## 2011-06-26 NOTE — Telephone Encounter (Signed)
Spoke with patient and his wife and gave him the results and recommendations per Dr. Juanda Chance. Scheduled B 12 injection on 07/07/11 at 8:40 AM. Labs in EPIC. Orders in EPIC

## 2011-06-29 DIAGNOSIS — I739 Peripheral vascular disease, unspecified: Secondary | ICD-10-CM | POA: Diagnosis not present

## 2011-06-29 DIAGNOSIS — S199XXA Unspecified injury of neck, initial encounter: Secondary | ICD-10-CM | POA: Diagnosis not present

## 2011-06-29 DIAGNOSIS — T1490XA Injury, unspecified, initial encounter: Secondary | ICD-10-CM | POA: Diagnosis not present

## 2011-06-29 DIAGNOSIS — R51 Headache: Secondary | ICD-10-CM | POA: Diagnosis not present

## 2011-06-29 DIAGNOSIS — M542 Cervicalgia: Secondary | ICD-10-CM | POA: Diagnosis not present

## 2011-06-29 DIAGNOSIS — S0993XA Unspecified injury of face, initial encounter: Secondary | ICD-10-CM | POA: Diagnosis not present

## 2011-07-07 ENCOUNTER — Ambulatory Visit (INDEPENDENT_AMBULATORY_CARE_PROVIDER_SITE_OTHER): Payer: Medicare Other | Admitting: Internal Medicine

## 2011-07-07 DIAGNOSIS — E538 Deficiency of other specified B group vitamins: Secondary | ICD-10-CM | POA: Diagnosis not present

## 2011-07-10 ENCOUNTER — Encounter: Payer: Self-pay | Admitting: Adult Health

## 2011-07-10 ENCOUNTER — Ambulatory Visit (INDEPENDENT_AMBULATORY_CARE_PROVIDER_SITE_OTHER): Payer: Medicare Other | Admitting: Adult Health

## 2011-07-10 VITALS — BP 124/68 | HR 67 | Temp 97.0°F | Ht 65.0 in | Wt 155.6 lb

## 2011-07-10 DIAGNOSIS — M25519 Pain in unspecified shoulder: Secondary | ICD-10-CM

## 2011-07-10 HISTORY — DX: Pain in unspecified shoulder: M25.519

## 2011-07-10 MED ORDER — IBUPROFEN 600 MG PO TABS: 600.0000 mg | ORAL_TABLET | Freq: Three times a day (TID) | ORAL | Status: AC | PRN
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
## 2011-07-10 NOTE — Progress Notes (Signed)
Addended by: Boone Master E on: 07/10/2011 10:13 AM   Modules accepted: Orders
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
## 2011-07-10 NOTE — Patient Instructions (Signed)
Add Ibuprofen 600mg  Three times a day  With food for 7 days  May continue to use Soma Three times a day  As needed  Muscle soreness/spasm.  May continue to use Tylenol with codeine As needed  Severe pain-may make you sleepy.  Alternate ice and heat to neck and shoulder.  We are referring you to Ortho  Please contact office for sooner follow up if symptoms do not improve or worsen or seek emergency care

## 2011-07-11 DIAGNOSIS — H409 Unspecified glaucoma: Secondary | ICD-10-CM | POA: Diagnosis not present

## 2011-07-11 DIAGNOSIS — H4011X0 Primary open-angle glaucoma, stage unspecified: Secondary | ICD-10-CM | POA: Diagnosis not present

## 2011-07-20 ENCOUNTER — Ambulatory Visit: Payer: Medicare Other | Admitting: Internal Medicine

## 2011-08-03 DIAGNOSIS — M542 Cervicalgia: Secondary | ICD-10-CM | POA: Diagnosis not present

## 2011-08-03 DIAGNOSIS — M5030 Other cervical disc degeneration, unspecified cervical region: Secondary | ICD-10-CM | POA: Diagnosis not present

## 2011-08-23 DIAGNOSIS — M542 Cervicalgia: Secondary | ICD-10-CM | POA: Diagnosis not present

## 2011-08-23 DIAGNOSIS — M5030 Other cervical disc degeneration, unspecified cervical region: Secondary | ICD-10-CM | POA: Diagnosis not present

## 2011-10-24 DIAGNOSIS — H35039 Hypertensive retinopathy, unspecified eye: Secondary | ICD-10-CM | POA: Diagnosis not present

## 2011-10-24 DIAGNOSIS — H409 Unspecified glaucoma: Secondary | ICD-10-CM | POA: Diagnosis not present

## 2011-10-24 DIAGNOSIS — H4011X0 Primary open-angle glaucoma, stage unspecified: Secondary | ICD-10-CM | POA: Diagnosis not present

## 2011-12-26 ENCOUNTER — Other Ambulatory Visit (INDEPENDENT_AMBULATORY_CARE_PROVIDER_SITE_OTHER): Payer: Medicare Other

## 2011-12-26 ENCOUNTER — Ambulatory Visit (INDEPENDENT_AMBULATORY_CARE_PROVIDER_SITE_OTHER): Payer: Medicare Other | Admitting: Internal Medicine

## 2011-12-26 ENCOUNTER — Encounter: Payer: Self-pay | Admitting: Internal Medicine

## 2011-12-26 VITALS — BP 98/62 | HR 59 | Temp 98.1°F | Ht 62.0 in | Wt 169.2 lb

## 2011-12-26 DIAGNOSIS — I10 Essential (primary) hypertension: Secondary | ICD-10-CM

## 2011-12-26 DIAGNOSIS — E785 Hyperlipidemia, unspecified: Secondary | ICD-10-CM

## 2011-12-26 DIAGNOSIS — Z23 Encounter for immunization: Secondary | ICD-10-CM | POA: Diagnosis not present

## 2011-12-26 DIAGNOSIS — R51 Headache: Secondary | ICD-10-CM

## 2011-12-26 HISTORY — DX: Hyperlipidemia, unspecified: E78.5

## 2011-12-26 HISTORY — DX: Essential (primary) hypertension: I10

## 2011-12-26 LAB — HEPATIC FUNCTION PANEL
ALT: 16 U/L (ref 0–53)
AST: 25 U/L (ref 0–37)
Albumin: 3.9 g/dL (ref 3.5–5.2)
Alkaline Phosphatase: 144 U/L — ABNORMAL HIGH (ref 39–117)
Bilirubin, Direct: 0.1 mg/dL (ref 0.0–0.3)
Total Bilirubin: 0.8 mg/dL (ref 0.3–1.2)
Total Protein: 7 g/dL (ref 6.0–8.3)

## 2011-12-26 LAB — URINALYSIS
Bilirubin Urine: NEGATIVE
Hgb urine dipstick: NEGATIVE
Ketones, ur: NEGATIVE
Leukocytes, UA: NEGATIVE
Nitrite: NEGATIVE
Specific Gravity, Urine: 1.02 (ref 1.000–1.030)
Total Protein, Urine: NEGATIVE
Urine Glucose: NEGATIVE
Urobilinogen, UA: 0.2 (ref 0.0–1.0)
pH: 6 (ref 5.0–8.0)

## 2011-12-26 LAB — BASIC METABOLIC PANEL
BUN: 20 mg/dL (ref 6–23)
CO2: 30 mEq/L (ref 19–32)
Calcium: 9.1 mg/dL (ref 8.4–10.5)
Chloride: 104 mEq/L (ref 96–112)
Creatinine, Ser: 1.2 mg/dL (ref 0.4–1.5)
GFR: 72.83 mL/min (ref >60.00)
Glucose, Bld: 94 mg/dL (ref 70–99)
Potassium: 4 mEq/L (ref 3.5–5.1)
Sodium: 141 mEq/L (ref 135–145)

## 2011-12-26 LAB — LDL CHOLESTEROL, DIRECT: Direct LDL: 106.9 mg/dL

## 2011-12-26 LAB — TSH: TSH: 2.55 u[IU]/mL (ref 0.35–5.50)

## 2011-12-26 LAB — LIPID PANEL
Cholesterol: 209 mg/dL — ABNORMAL HIGH (ref 0–200)
HDL: 84.6 mg/dL (ref >39.00)
Total CHOL/HDL Ratio: 2
Triglycerides: 37 mg/dL (ref 0.0–149.0)
VLDL: 7.4 mg/dL (ref 0.0–40.0)

## 2011-12-26 NOTE — Patient Instructions (Addendum)
Please remember to go to the lab and x-ray department downstairs for your tests - we will call you with the results when they are available.     Please schedule a follow up visit in 6  months but call sooner if needed   
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
## 2011-12-27 NOTE — Progress Notes (Signed)
Quick Note:  Called and spoke with patients wife regarding results and recs listed below per Dr, [name]. Patients wife verbalized understanding and no further questions or concerns at this time. ______

## 2011-12-29 DIAGNOSIS — R51 Headache: Secondary | ICD-10-CM

## 2011-12-29 DIAGNOSIS — R519 Headache, unspecified: Secondary | ICD-10-CM | POA: Insufficient documentation

## 2011-12-29 HISTORY — DX: Headache: R51
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
## 2011-12-29 NOTE — Assessment & Plan Note (Signed)
Resolved s rx

## 2011-12-29 NOTE — Assessment & Plan Note (Signed)
Adequate control on present rx, reviewed  

## 2012-01-17 ENCOUNTER — Encounter: Payer: Self-pay | Admitting: *Deleted

## 2012-01-30 DIAGNOSIS — H409 Unspecified glaucoma: Secondary | ICD-10-CM | POA: Diagnosis not present

## 2012-01-30 DIAGNOSIS — H35039 Hypertensive retinopathy, unspecified eye: Secondary | ICD-10-CM | POA: Diagnosis not present

## 2012-01-30 DIAGNOSIS — H4011X0 Primary open-angle glaucoma, stage unspecified: Secondary | ICD-10-CM | POA: Diagnosis not present

## 2012-01-31 ENCOUNTER — Other Ambulatory Visit (INDEPENDENT_AMBULATORY_CARE_PROVIDER_SITE_OTHER): Payer: Medicare Other

## 2012-01-31 DIAGNOSIS — E538 Deficiency of other specified B group vitamins: Secondary | ICD-10-CM

## 2012-01-31 LAB — VITAMIN B12: Vitamin B-12: 331 pg/mL (ref 211–911)

## 2012-02-05 ENCOUNTER — Encounter: Payer: Self-pay | Admitting: *Deleted

## 2012-02-05 ENCOUNTER — Other Ambulatory Visit: Payer: Self-pay | Admitting: *Deleted

## 2012-02-05 DIAGNOSIS — E538 Deficiency of other specified B group vitamins: Secondary | ICD-10-CM

## 2012-02-06 ENCOUNTER — Telehealth: Payer: Self-pay | Admitting: *Deleted

## 2012-02-06 NOTE — Telephone Encounter (Signed)
Patient's wife calling to schedule B12 injection for patient. Scheduled on 02/14/12 at 10:00 AM.

## 2012-02-14 ENCOUNTER — Other Ambulatory Visit: Payer: Self-pay | Admitting: Gastroenterology

## 2012-02-14 ENCOUNTER — Ambulatory Visit (INDEPENDENT_AMBULATORY_CARE_PROVIDER_SITE_OTHER): Payer: Medicare Other | Admitting: Internal Medicine

## 2012-02-14 DIAGNOSIS — E538 Deficiency of other specified B group vitamins: Secondary | ICD-10-CM | POA: Diagnosis not present

## 2012-02-14 MED ORDER — CYANOCOBALAMIN 1000 MCG/ML IJ SOLN
1000.0000 ug | INTRAMUSCULAR | Status: DC
  Administered 2012-02-14 – 2012-06-26 (×3): 1000 ug via INTRAMUSCULAR

## 2012-03-13 ENCOUNTER — Ambulatory Visit (INDEPENDENT_AMBULATORY_CARE_PROVIDER_SITE_OTHER): Payer: Medicare Other | Admitting: Internal Medicine

## 2012-03-13 ENCOUNTER — Ambulatory Visit (INDEPENDENT_AMBULATORY_CARE_PROVIDER_SITE_OTHER): Payer: Medicare Other

## 2012-03-13 DIAGNOSIS — Z23 Encounter for immunization: Secondary | ICD-10-CM

## 2012-03-13 DIAGNOSIS — E538 Deficiency of other specified B group vitamins: Secondary | ICD-10-CM

## 2012-03-13 MED ORDER — CYANOCOBALAMIN 1000 MCG/ML IJ SOLN
1000.0000 ug | Freq: Once | INTRAMUSCULAR | Status: AC
  Administered 2012-03-13: 1000 ug via INTRAMUSCULAR

## 2012-04-24 ENCOUNTER — Ambulatory Visit (INDEPENDENT_AMBULATORY_CARE_PROVIDER_SITE_OTHER): Payer: Medicare Other | Admitting: Internal Medicine

## 2012-04-24 DIAGNOSIS — E538 Deficiency of other specified B group vitamins: Secondary | ICD-10-CM

## 2012-04-30 DIAGNOSIS — H4011X0 Primary open-angle glaucoma, stage unspecified: Secondary | ICD-10-CM | POA: Diagnosis not present

## 2012-04-30 DIAGNOSIS — H409 Unspecified glaucoma: Secondary | ICD-10-CM | POA: Diagnosis not present

## 2012-05-02 ENCOUNTER — Encounter: Payer: Self-pay | Admitting: *Deleted

## 2012-05-28 ENCOUNTER — Encounter: Payer: Self-pay | Admitting: Internal Medicine

## 2012-05-28 ENCOUNTER — Other Ambulatory Visit (INDEPENDENT_AMBULATORY_CARE_PROVIDER_SITE_OTHER): Payer: Medicare Other

## 2012-05-28 ENCOUNTER — Ambulatory Visit (INDEPENDENT_AMBULATORY_CARE_PROVIDER_SITE_OTHER): Payer: Medicare Other | Admitting: Internal Medicine

## 2012-05-28 VITALS — BP 112/58 | HR 62 | Ht 67.0 in | Wt 151.2 lb

## 2012-05-28 DIAGNOSIS — R6889 Other general symptoms and signs: Secondary | ICD-10-CM

## 2012-05-28 DIAGNOSIS — D509 Iron deficiency anemia, unspecified: Secondary | ICD-10-CM

## 2012-05-28 DIAGNOSIS — E538 Deficiency of other specified B group vitamins: Secondary | ICD-10-CM

## 2012-05-28 DIAGNOSIS — E611 Iron deficiency: Secondary | ICD-10-CM

## 2012-05-28 LAB — SEDIMENTATION RATE: Sed Rate: 7 mm/hr (ref 0–22)

## 2012-05-28 LAB — CBC
HCT: 38.2 % — ABNORMAL LOW (ref 39.0–52.0)
Hemoglobin: 12.7 g/dL — ABNORMAL LOW (ref 13.0–17.0)
MCHC: 33.3 g/dL (ref 30.0–36.0)
MCV: 92.7 fl (ref 78.0–100.0)
Platelets: 160 10*3/uL (ref 150.0–400.0)
RBC: 4.11 Mil/uL — ABNORMAL LOW (ref 4.22–5.81)
RDW: 13.7 % (ref 11.5–14.6)
WBC: 3.5 10*3/uL — ABNORMAL LOW (ref 4.5–10.5)

## 2012-05-28 LAB — IRON: Iron: 103 ug/dL (ref 42–165)

## 2012-05-28 LAB — TSH: TSH: 1.16 u[IU]/mL (ref 0.35–5.50)

## 2012-05-28 NOTE — Patient Instructions (Addendum)
You will go to the basement today for labs You received your B12 injection today Dr [name]

## 2012-05-28 NOTE — Progress Notes (Signed)
[name] [birth year] MRN [id number]        History of Present Illness:  This is a 78 year old African American male with 10 pound weight loss since last visit in January 2012 when he weiged 160 pounds. Today he is 151 pounds. His wife says that patient has a good appetite and eats well although he sometimes skips breakfast when he works on the yard. He and his wife  don't eat as much as they used to. He is having regular bowel movements. He has a history of B12 deficiency and mild iron deficiency. There is a family history of colon cancer in his mother, his last colonoscopy in January 2012 showed the tubular adenoma, he has a mild elevation of alkaline phosphatase. His sprue profile is negative.   Past Medical History  Diagnosis Date  . Pseudodementia   . Obesity   . RBBB (right bundle branch block)   . BPH (benign prostatic hypertrophy)   . Left varicocele   . Glaucoma(365)   . Internal hemorrhoids   . Diverticula of colon   . Hearing loss   . Hx of adenomatous colonic polyps    Past Surgical History  Procedure Date  . Glaucoma repair     left eye  . Cataract surgery     right eye    reports that he quit smoking about 55 years ago. His smoking use included Cigarettes. He has a 3 pack-year smoking history. He has never used smokeless tobacco. He reports that he does not drink alcohol or use illicit drugs. family history includes Cancer in his mother; Heart disease (age of onset:70) in his father; Kidney disease in his brother; and Parkinson's disease in his father.  There is no history of Colon cancer. No Known Allergies      Review of Systems: Good appetite. Weight loss of 9 pounds. Negative for abdominal pain or rectal bleeding  The remainder of the 10 point ROS is negative except as outlined in H&P   Physical Exam: General appearance  Well developed, in no distress. Eyes- non icteric. HEENT nontraumatic, normocephalic. Mouth no lesions, tongue papillated, no  cheilosis. Neck supple without adenopathy, thyroid not enlarged, no carotid bruits, no JVD. Lungs Clear to auscultation bilaterally. Cor normal S1, normal S2, regular rhythm, no murmur,  quiet precordium. Abdomen: Soft nontender abdomen with normal active bowel sounds. No distention. No bruit. Liver edge at costal margin Rectal: Soft Hemoccult negative stool Extremities no pedal edema. Skin no lesions. Neurological alert and oriented x 3. Psychological normal mood and affect.  Assessment and Plan:  78 year old [place] American male brought by his wife. He has mild dementia. He has lost 9 pounds over past 2 years but has no specific GI symptoms to suggest any problem. We will check his serum prealbumin, TSH level, sedimentation rate and iron studies. He is due to have a B12 injection today I will see him on when necessary basis  History of adenomatous polyp of the colon in January 2012. No plans for recall due to age   05/28/2012 [name]

## 2012-05-29 ENCOUNTER — Other Ambulatory Visit: Payer: Self-pay | Admitting: *Deleted

## 2012-05-29 DIAGNOSIS — D649 Anemia, unspecified: Secondary | ICD-10-CM

## 2012-05-29 LAB — PREALBUMIN: Prealbumin: 23.6 mg/dL (ref 17.0–34.0)

## 2012-05-31 ENCOUNTER — Encounter: Payer: Self-pay | Admitting: Internal Medicine

## 2012-05-31 ENCOUNTER — Ambulatory Visit (INDEPENDENT_AMBULATORY_CARE_PROVIDER_SITE_OTHER): Payer: Medicare Other | Admitting: Internal Medicine

## 2012-05-31 ENCOUNTER — Ambulatory Visit (INDEPENDENT_AMBULATORY_CARE_PROVIDER_SITE_OTHER)
Admission: RE | Admit: 2012-05-31 | Discharge: 2012-05-31 | Disposition: A | Discharge status: Home / Self Care | Payer: Medicare Other | Source: Ambulatory Visit | Attending: Internal Medicine | Admitting: Internal Medicine

## 2012-05-31 VITALS — BP 114/68 | HR 59 | Temp 97.4°F | Ht 67.0 in | Wt 149.0 lb

## 2012-05-31 DIAGNOSIS — R634 Abnormal weight loss: Secondary | ICD-10-CM

## 2012-05-31 DIAGNOSIS — F6811 Factitious disorder with predominantly psychological signs and symptoms: Secondary | ICD-10-CM | POA: Diagnosis not present

## 2012-05-31 DIAGNOSIS — J438 Other emphysema: Secondary | ICD-10-CM | POA: Diagnosis not present

## 2012-05-31 HISTORY — DX: Abnormal weight loss: R63.4

## 2012-05-31 MED ORDER — TRAZODONE HCL 50 MG PO TABS: 50.0000 mg | ORAL_TABLET | Freq: Every day | ORAL | Status: DC

## 2012-05-31 NOTE — Patient Instructions (Addendum)
Please remember to go to the  x-ray department downstairs for your tests - we will call you with the results when they are available.   Trazadone 50 mg one half pill at bedtime   Please schedule a follow up visit in 3 months but call sooner if needed cpx

## 2012-05-31 NOTE — Assessment & Plan Note (Signed)
Short term memory a bit sluggish ? True dementia vs pseudodementia > rechallenge with trazadone
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
## 2012-05-31 NOTE — Assessment & Plan Note (Addendum)
I had an extended discussion with the patient and wife  today lasting 15 to 20 minutes of a 25 minute visit on the following issues:   No obvious source for wt loss and he's had ha and insomnia and fatigue / malaise/pseudodementia  in past always with improvement on trazaone so, despite absence of typical depression symptoms, rec rechallenge with trazadone starting with 25 mg at hs to build to 50 mg

## 2012-06-03 ENCOUNTER — Other Ambulatory Visit: Payer: Self-pay | Admitting: Internal Medicine

## 2012-06-03 NOTE — Progress Notes (Signed)
Quick Note:  Spoke with pt and notified of results per Dr. Wert. Pt verbalized understanding and denied any questions.  ______ 

## 2012-06-26 ENCOUNTER — Ambulatory Visit (INDEPENDENT_AMBULATORY_CARE_PROVIDER_SITE_OTHER): Payer: Medicare Other | Admitting: Internal Medicine

## 2012-06-26 DIAGNOSIS — E538 Deficiency of other specified B group vitamins: Secondary | ICD-10-CM | POA: Diagnosis not present

## 2012-07-18 ENCOUNTER — Telehealth: Payer: Self-pay | Admitting: *Deleted

## 2012-07-18 NOTE — Telephone Encounter (Signed)
Spoke with patient and wife. He will come next week for labs.

## 2012-07-18 NOTE — Telephone Encounter (Signed)
Message copied by Daphine Deutscher on Thu Jul 18, 2012  8:16 AM ------      Message from: Daphine Deutscher      Created: Wed May 29, 2012  9:36 AM       Call and remind patient CBC due on 07/22/12 DB

## 2012-07-24 ENCOUNTER — Other Ambulatory Visit: Payer: Self-pay | Admitting: *Deleted

## 2012-07-24 ENCOUNTER — Other Ambulatory Visit (INDEPENDENT_AMBULATORY_CARE_PROVIDER_SITE_OTHER): Payer: Medicare Other

## 2012-07-24 DIAGNOSIS — D649 Anemia, unspecified: Secondary | ICD-10-CM

## 2012-07-24 LAB — CBC WITH DIFFERENTIAL/PLATELET
Basophils Absolute: 0 10*3/uL (ref 0.0–0.1)
Basophils Relative: 0.6 % (ref 0.0–3.0)
Eosinophils Absolute: 0.1 10*3/uL (ref 0.0–0.7)
Eosinophils Relative: 1.5 % (ref 0.0–5.0)
HCT: 39 % (ref 39.0–52.0)
Hemoglobin: 12.9 g/dL — ABNORMAL LOW (ref 13.0–17.0)
Lymphocytes Relative: 32.9 % (ref 12.0–46.0)
Lymphs Abs: 1.4 10*3/uL (ref 0.7–4.0)
MCHC: 33.1 g/dL (ref 30.0–36.0)
MCV: 93.6 fl (ref 78.0–100.0)
Monocytes Absolute: 0.6 10*3/uL (ref 0.1–1.0)
Monocytes Relative: 13.8 % — ABNORMAL HIGH (ref 3.0–12.0)
Neutro Abs: 2.1 10*3/uL (ref 1.4–7.7)
Neutrophils Relative %: 51.2 % (ref 43.0–77.0)
Platelets: 151 10*3/uL (ref 150.0–400.0)
RBC: 4.17 Mil/uL — ABNORMAL LOW (ref 4.22–5.81)
RDW: 13.8 % (ref 11.5–14.6)
WBC: 4.1 10*3/uL — ABNORMAL LOW (ref 4.5–10.5)

## 2012-07-30 DIAGNOSIS — H409 Unspecified glaucoma: Secondary | ICD-10-CM | POA: Diagnosis not present

## 2012-07-30 DIAGNOSIS — H4011X0 Primary open-angle glaucoma, stage unspecified: Secondary | ICD-10-CM | POA: Diagnosis not present

## 2012-07-31 ENCOUNTER — Other Ambulatory Visit: Payer: Self-pay | Admitting: *Deleted

## 2012-07-31 ENCOUNTER — Encounter: Payer: Medicare Other | Admitting: Internal Medicine

## 2012-07-31 ENCOUNTER — Other Ambulatory Visit (INDEPENDENT_AMBULATORY_CARE_PROVIDER_SITE_OTHER): Payer: Medicare Other

## 2012-07-31 DIAGNOSIS — E538 Deficiency of other specified B group vitamins: Secondary | ICD-10-CM

## 2012-07-31 LAB — VITAMIN B12: Vitamin B-12: 566 pg/mL (ref 211–911)

## 2012-08-29 ENCOUNTER — Ambulatory Visit: Payer: Medicare Other | Admitting: Internal Medicine

## 2012-09-12 ENCOUNTER — Encounter: Payer: Self-pay | Admitting: Internal Medicine

## 2012-09-12 ENCOUNTER — Ambulatory Visit (INDEPENDENT_AMBULATORY_CARE_PROVIDER_SITE_OTHER): Payer: Medicare Other | Admitting: Internal Medicine

## 2012-09-12 ENCOUNTER — Other Ambulatory Visit (INDEPENDENT_AMBULATORY_CARE_PROVIDER_SITE_OTHER): Payer: Medicare Other

## 2012-09-12 VITALS — BP 110/64 | HR 54 | Temp 97.0°F | Ht 67.5 in | Wt 153.0 lb

## 2012-09-12 DIAGNOSIS — R5383 Other fatigue: Secondary | ICD-10-CM

## 2012-09-12 DIAGNOSIS — I10 Essential (primary) hypertension: Secondary | ICD-10-CM | POA: Diagnosis not present

## 2012-09-12 DIAGNOSIS — M25559 Pain in unspecified hip: Secondary | ICD-10-CM

## 2012-09-12 DIAGNOSIS — R5381 Other malaise: Secondary | ICD-10-CM

## 2012-09-12 DIAGNOSIS — M25552 Pain in left hip: Secondary | ICD-10-CM

## 2012-09-12 DIAGNOSIS — R634 Abnormal weight loss: Secondary | ICD-10-CM

## 2012-09-12 LAB — CBC WITH DIFFERENTIAL/PLATELET
Basophils Absolute: 0.1 10*3/uL (ref 0.0–0.1)
Basophils Relative: 1.5 % (ref 0.0–3.0)
Eosinophils Absolute: 0.1 10*3/uL (ref 0.0–0.7)
Eosinophils Relative: 2.6 % (ref 0.0–5.0)
HCT: 36.8 % — ABNORMAL LOW (ref 39.0–52.0)
Hemoglobin: 12.1 g/dL — ABNORMAL LOW (ref 13.0–17.0)
Lymphocytes Relative: 26.6 % (ref 12.0–46.0)
Lymphs Abs: 1 10*3/uL (ref 0.7–4.0)
MCHC: 32.8 g/dL (ref 30.0–36.0)
MCV: 93.9 fl (ref 78.0–100.0)
Monocytes Absolute: 0.6 10*3/uL (ref 0.1–1.0)
Monocytes Relative: 14.4 % — ABNORMAL HIGH (ref 3.0–12.0)
Neutro Abs: 2.1 10*3/uL (ref 1.4–7.7)
Neutrophils Relative %: 54.9 % (ref 43.0–77.0)
Platelets: 195 10*3/uL (ref 150.0–400.0)
RBC: 3.92 Mil/uL — ABNORMAL LOW (ref 4.22–5.81)
RDW: 13.4 % (ref 11.5–14.6)
WBC: 3.9 10*3/uL — ABNORMAL LOW (ref 4.5–10.5)

## 2012-09-12 LAB — TSH: TSH: 2.44 u[IU]/mL (ref 0.35–5.50)

## 2012-09-12 LAB — BASIC METABOLIC PANEL
BUN: 15 mg/dL (ref 6–23)
CO2: 28 mEq/L (ref 19–32)
Calcium: 8.7 mg/dL (ref 8.4–10.5)
Chloride: 101 mEq/L (ref 96–112)
Creatinine, Ser: 1.2 mg/dL (ref 0.4–1.5)
GFR: 77.84 mL/min (ref >60.00)
Glucose, Bld: 101 mg/dL — ABNORMAL HIGH (ref 70–99)
Potassium: 3.9 mEq/L (ref 3.5–5.1)
Sodium: 135 mEq/L (ref 135–145)

## 2012-09-12 LAB — URINALYSIS
Bilirubin Urine: NEGATIVE
Hgb urine dipstick: NEGATIVE
Ketones, ur: NEGATIVE
Leukocytes, UA: NEGATIVE
Nitrite: NEGATIVE
Specific Gravity, Urine: 1.02 (ref 1.000–1.030)
Total Protein, Urine: NEGATIVE
Urine Glucose: NEGATIVE
Urobilinogen, UA: 0.2 (ref 0.0–1.0)
pH: 6 (ref 5.0–8.0)

## 2012-09-12 NOTE — Patient Instructions (Addendum)
Please remember to go to the lab   department downstairs for your tests - we will call you with the results when they are available.    
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
## 2012-09-14 DIAGNOSIS — M25559 Pain in unspecified hip: Secondary | ICD-10-CM

## 2012-09-14 HISTORY — DX: Pain in unspecified hip: M25.559

## 2012-09-14 NOTE — Assessment & Plan Note (Addendum)
Adequate control on present rx, reviewed > diet only   Lab Results  Component Value Date   CREATININE 1.2 09/12/2012   CREATININE 1.2 12/26/2011   CREATININE 1.2 03/29/2011

## 2012-09-14 NOTE — Assessment & Plan Note (Addendum)
Responded to nsaids with no findings on exam > rec nsaids only / ortho prn   Recent Labs Lab 09/12/12 0954  HGB 12.1*

## 2012-09-14 NOTE — Assessment & Plan Note (Signed)
Variably problematic, did not tol trazadone but if anything mental status sharper now so no need for rx of what is a mild pseudodementia pattern.

## 2012-09-14 NOTE — Assessment & Plan Note (Signed)
-   restart trazadone 05/31/2012 > did not tol  Wt Readings from Last 3 Encounters:  09/12/12 153 lb (69.4 kg)  05/31/12 149 lb (67.586 kg)  05/28/12 151 lb 3.2 oz (68.584 kg)    resolved

## 2012-10-17 ENCOUNTER — Telehealth: Payer: Self-pay | Admitting: *Deleted

## 2012-10-17 NOTE — Telephone Encounter (Signed)
Left a message for patient to call me. 

## 2012-10-17 NOTE — Telephone Encounter (Signed)
Message copied by Daphine Deutscher on Thu Oct 17, 2012  9:16 AM ------      Message from: Daphine Deutscher      Created: Wed Jul 24, 2012  1:39 PM       Call and remind CBC due for DB on 10/23/12 ------

## 2012-10-17 NOTE — Telephone Encounter (Signed)
Spoke with patient's wife and he will come for labs. 

## 2012-10-29 ENCOUNTER — Encounter: Payer: Self-pay | Admitting: *Deleted

## 2012-11-01 ENCOUNTER — Telehealth: Payer: Self-pay | Admitting: Internal Medicine

## 2012-11-01 NOTE — Telephone Encounter (Signed)
I spoke with spouse. She stated pt could come in 11/05/12 at 11:00. Pt is scheduled at that time. Nothing further was needed

## 2012-11-05 ENCOUNTER — Ambulatory Visit (INDEPENDENT_AMBULATORY_CARE_PROVIDER_SITE_OTHER): Payer: Medicare Other | Admitting: Internal Medicine

## 2012-11-05 ENCOUNTER — Other Ambulatory Visit (INDEPENDENT_AMBULATORY_CARE_PROVIDER_SITE_OTHER): Payer: Medicare Other

## 2012-11-05 ENCOUNTER — Telehealth: Payer: Self-pay | Admitting: Internal Medicine

## 2012-11-05 ENCOUNTER — Encounter: Payer: Self-pay | Admitting: Internal Medicine

## 2012-11-05 ENCOUNTER — Ambulatory Visit: Payer: Medicare Other | Admitting: Internal Medicine

## 2012-11-05 ENCOUNTER — Ambulatory Visit (INDEPENDENT_AMBULATORY_CARE_PROVIDER_SITE_OTHER)
Admission: RE | Admit: 2012-11-05 | Discharge: 2012-11-05 | Disposition: A | Discharge status: Home / Self Care | Payer: Medicare Other | Source: Ambulatory Visit | Attending: Internal Medicine | Admitting: Internal Medicine

## 2012-11-05 VITALS — BP 104/62 | HR 59 | Temp 97.9°F | Ht 66.34 in | Wt 146.4 lb

## 2012-11-05 DIAGNOSIS — M25559 Pain in unspecified hip: Secondary | ICD-10-CM

## 2012-11-05 DIAGNOSIS — M1610 Unilateral primary osteoarthritis, unspecified hip: Secondary | ICD-10-CM | POA: Diagnosis not present

## 2012-11-05 DIAGNOSIS — H4011X0 Primary open-angle glaucoma, stage unspecified: Secondary | ICD-10-CM | POA: Diagnosis not present

## 2012-11-05 DIAGNOSIS — M169 Osteoarthritis of hip, unspecified: Secondary | ICD-10-CM | POA: Diagnosis not present

## 2012-11-05 DIAGNOSIS — M25552 Pain in left hip: Secondary | ICD-10-CM

## 2012-11-05 DIAGNOSIS — D649 Anemia, unspecified: Secondary | ICD-10-CM

## 2012-11-05 DIAGNOSIS — H409 Unspecified glaucoma: Secondary | ICD-10-CM | POA: Diagnosis not present

## 2012-11-05 LAB — CBC WITH DIFFERENTIAL/PLATELET
Basophils Absolute: 0 10*3/uL (ref 0.0–0.1)
Basophils Relative: 0.5 % (ref 0.0–3.0)
Eosinophils Absolute: 0.1 10*3/uL (ref 0.0–0.7)
Eosinophils Relative: 1.5 % (ref 0.0–5.0)
HCT: 36.3 % — ABNORMAL LOW (ref 39.0–52.0)
Hemoglobin: 12.1 g/dL — ABNORMAL LOW (ref 13.0–17.0)
Lymphocytes Relative: 37.5 % (ref 12.0–46.0)
Lymphs Abs: 1.4 10*3/uL (ref 0.7–4.0)
MCHC: 33.5 g/dL (ref 30.0–36.0)
MCV: 92.7 fl (ref 78.0–100.0)
Monocytes Absolute: 0.5 10*3/uL (ref 0.1–1.0)
Monocytes Relative: 13.5 % — ABNORMAL HIGH (ref 3.0–12.0)
Neutro Abs: 1.8 10*3/uL (ref 1.4–7.7)
Neutrophils Relative %: 47 % (ref 43.0–77.0)
Platelets: 157 10*3/uL (ref 150.0–400.0)
RBC: 3.91 Mil/uL — ABNORMAL LOW (ref 4.22–5.81)
RDW: 13.9 % (ref 11.5–14.6)
WBC: 3.8 10*3/uL — ABNORMAL LOW (ref 4.5–10.5)

## 2012-11-05 NOTE — Assessment & Plan Note (Signed)
-   Fell on Ice mid feb 2014 - L hip film 11/05/2012 > Slight narrowing of hip joint space. No fracture or significant spurring. No calcific bursitis  Did not take nsaids as rec but asked him to do so and will refer to ortho per Wife's request
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
## 2012-11-05 NOTE — Telephone Encounter (Signed)
ATC [name] back, no answer [id number]

## 2012-11-05 NOTE — Patient Instructions (Addendum)
Please see patient coordinator before you leave today  to schedule Dr Wells Guiles office   Please remember to go to the   x-ray department downstairs for your tests - we will call you with the results when they are available.     In meantime use the aleve as needed but take with meals

## 2012-11-06 NOTE — Telephone Encounter (Signed)
Spoke with pt's wife and given hip xray results.  She will await call back from [facility] regarding appt with Dr Noel Gerold.

## 2012-11-06 NOTE — Telephone Encounter (Signed)
Pt's wife returned call and can be reached @ [phone]. [name]

## 2012-11-18 ENCOUNTER — Ambulatory Visit: Payer: Medicare Other | Admitting: Internal Medicine

## 2012-12-04 DIAGNOSIS — M25559 Pain in unspecified hip: Secondary | ICD-10-CM | POA: Diagnosis not present

## 2013-01-02 DIAGNOSIS — M719 Bursopathy, unspecified: Secondary | ICD-10-CM | POA: Diagnosis not present

## 2013-01-02 DIAGNOSIS — M67919 Unspecified disorder of synovium and tendon, unspecified shoulder: Secondary | ICD-10-CM | POA: Diagnosis not present

## 2013-01-20 DIAGNOSIS — M719 Bursopathy, unspecified: Secondary | ICD-10-CM | POA: Diagnosis not present

## 2013-01-20 DIAGNOSIS — M25519 Pain in unspecified shoulder: Secondary | ICD-10-CM | POA: Diagnosis not present

## 2013-01-20 DIAGNOSIS — M67919 Unspecified disorder of synovium and tendon, unspecified shoulder: Secondary | ICD-10-CM | POA: Diagnosis not present

## 2013-01-20 DIAGNOSIS — M25559 Pain in unspecified hip: Secondary | ICD-10-CM | POA: Diagnosis not present

## 2013-01-20 DIAGNOSIS — IMO0001 Reserved for inherently not codable concepts without codable children: Secondary | ICD-10-CM | POA: Diagnosis not present

## 2013-01-20 DIAGNOSIS — M76899 Other specified enthesopathies of unspecified lower limb, excluding foot: Secondary | ICD-10-CM | POA: Diagnosis not present

## 2013-01-23 ENCOUNTER — Other Ambulatory Visit: Payer: Self-pay | Admitting: *Deleted

## 2013-01-23 ENCOUNTER — Telehealth: Payer: Self-pay | Admitting: *Deleted

## 2013-01-23 DIAGNOSIS — E538 Deficiency of other specified B group vitamins: Secondary | ICD-10-CM

## 2013-01-23 NOTE — Telephone Encounter (Signed)
Message copied by Daphine Deutscher on Thu Jan 23, 2013  8:15 AM ------      Message from: Daphine Deutscher      Created: Wed Jul 31, 2012  3:14 PM       Patient needs vit b12 level on 01/27/13 for DB. Needs order put in computer and call patient to remind  ------

## 2013-01-23 NOTE — Telephone Encounter (Signed)
Spoke with patient and wants me to call back tomorrow and tell his wife about his appointment.

## 2013-01-24 DIAGNOSIS — M25519 Pain in unspecified shoulder: Secondary | ICD-10-CM | POA: Diagnosis not present

## 2013-01-24 DIAGNOSIS — IMO0001 Reserved for inherently not codable concepts without codable children: Secondary | ICD-10-CM | POA: Diagnosis not present

## 2013-01-24 DIAGNOSIS — M25559 Pain in unspecified hip: Secondary | ICD-10-CM | POA: Diagnosis not present

## 2013-01-24 DIAGNOSIS — M67919 Unspecified disorder of synovium and tendon, unspecified shoulder: Secondary | ICD-10-CM | POA: Diagnosis not present

## 2013-01-24 DIAGNOSIS — M76899 Other specified enthesopathies of unspecified lower limb, excluding foot: Secondary | ICD-10-CM | POA: Diagnosis not present

## 2013-01-24 NOTE — Telephone Encounter (Signed)
Left a message for patient's wife to call me. 

## 2013-01-24 NOTE — Telephone Encounter (Signed)
Spoke with patient's wife and told her about labs due next week.

## 2013-01-29 DIAGNOSIS — M25519 Pain in unspecified shoulder: Secondary | ICD-10-CM | POA: Diagnosis not present

## 2013-01-29 DIAGNOSIS — M67919 Unspecified disorder of synovium and tendon, unspecified shoulder: Secondary | ICD-10-CM | POA: Diagnosis not present

## 2013-01-29 DIAGNOSIS — M25559 Pain in unspecified hip: Secondary | ICD-10-CM | POA: Diagnosis not present

## 2013-01-29 DIAGNOSIS — IMO0001 Reserved for inherently not codable concepts without codable children: Secondary | ICD-10-CM | POA: Diagnosis not present

## 2013-01-29 DIAGNOSIS — M76899 Other specified enthesopathies of unspecified lower limb, excluding foot: Secondary | ICD-10-CM | POA: Diagnosis not present

## 2013-01-31 ENCOUNTER — Other Ambulatory Visit (INDEPENDENT_AMBULATORY_CARE_PROVIDER_SITE_OTHER): Payer: Medicare Other

## 2013-01-31 DIAGNOSIS — M67919 Unspecified disorder of synovium and tendon, unspecified shoulder: Secondary | ICD-10-CM | POA: Diagnosis not present

## 2013-01-31 DIAGNOSIS — M76899 Other specified enthesopathies of unspecified lower limb, excluding foot: Secondary | ICD-10-CM | POA: Diagnosis not present

## 2013-01-31 DIAGNOSIS — IMO0001 Reserved for inherently not codable concepts without codable children: Secondary | ICD-10-CM | POA: Diagnosis not present

## 2013-01-31 DIAGNOSIS — E538 Deficiency of other specified B group vitamins: Secondary | ICD-10-CM | POA: Diagnosis not present

## 2013-01-31 DIAGNOSIS — M25559 Pain in unspecified hip: Secondary | ICD-10-CM | POA: Diagnosis not present

## 2013-01-31 DIAGNOSIS — M25519 Pain in unspecified shoulder: Secondary | ICD-10-CM | POA: Diagnosis not present

## 2013-01-31 LAB — VITAMIN B12: Vitamin B-12: 393 pg/mL (ref 211–911)

## 2013-02-04 ENCOUNTER — Other Ambulatory Visit: Payer: Self-pay | Admitting: *Deleted

## 2013-02-04 DIAGNOSIS — E538 Deficiency of other specified B group vitamins: Secondary | ICD-10-CM

## 2013-02-05 DIAGNOSIS — M25519 Pain in unspecified shoulder: Secondary | ICD-10-CM | POA: Diagnosis not present

## 2013-02-05 DIAGNOSIS — IMO0001 Reserved for inherently not codable concepts without codable children: Secondary | ICD-10-CM | POA: Diagnosis not present

## 2013-02-05 DIAGNOSIS — M67919 Unspecified disorder of synovium and tendon, unspecified shoulder: Secondary | ICD-10-CM | POA: Diagnosis not present

## 2013-02-05 DIAGNOSIS — M25559 Pain in unspecified hip: Secondary | ICD-10-CM | POA: Diagnosis not present

## 2013-02-05 DIAGNOSIS — M76899 Other specified enthesopathies of unspecified lower limb, excluding foot: Secondary | ICD-10-CM | POA: Diagnosis not present

## 2013-02-07 DIAGNOSIS — M25559 Pain in unspecified hip: Secondary | ICD-10-CM | POA: Diagnosis not present

## 2013-02-07 DIAGNOSIS — IMO0001 Reserved for inherently not codable concepts without codable children: Secondary | ICD-10-CM | POA: Diagnosis not present

## 2013-02-07 DIAGNOSIS — M67919 Unspecified disorder of synovium and tendon, unspecified shoulder: Secondary | ICD-10-CM | POA: Diagnosis not present

## 2013-02-07 DIAGNOSIS — M25519 Pain in unspecified shoulder: Secondary | ICD-10-CM | POA: Diagnosis not present

## 2013-02-07 DIAGNOSIS — M76899 Other specified enthesopathies of unspecified lower limb, excluding foot: Secondary | ICD-10-CM | POA: Diagnosis not present

## 2013-02-11 DIAGNOSIS — M67919 Unspecified disorder of synovium and tendon, unspecified shoulder: Secondary | ICD-10-CM | POA: Diagnosis not present

## 2013-02-11 DIAGNOSIS — IMO0001 Reserved for inherently not codable concepts without codable children: Secondary | ICD-10-CM | POA: Diagnosis not present

## 2013-02-11 DIAGNOSIS — M25559 Pain in unspecified hip: Secondary | ICD-10-CM | POA: Diagnosis not present

## 2013-02-11 DIAGNOSIS — M25519 Pain in unspecified shoulder: Secondary | ICD-10-CM | POA: Diagnosis not present

## 2013-02-11 DIAGNOSIS — M4712 Other spondylosis with myelopathy, cervical region: Secondary | ICD-10-CM | POA: Diagnosis not present

## 2013-02-11 DIAGNOSIS — H4011X0 Primary open-angle glaucoma, stage unspecified: Secondary | ICD-10-CM | POA: Diagnosis not present

## 2013-02-11 DIAGNOSIS — M76899 Other specified enthesopathies of unspecified lower limb, excluding foot: Secondary | ICD-10-CM | POA: Diagnosis not present

## 2013-02-11 DIAGNOSIS — H409 Unspecified glaucoma: Secondary | ICD-10-CM | POA: Diagnosis not present

## 2013-02-11 DIAGNOSIS — M542 Cervicalgia: Secondary | ICD-10-CM | POA: Diagnosis not present

## 2013-02-13 DIAGNOSIS — IMO0001 Reserved for inherently not codable concepts without codable children: Secondary | ICD-10-CM | POA: Diagnosis not present

## 2013-02-13 DIAGNOSIS — M25519 Pain in unspecified shoulder: Secondary | ICD-10-CM | POA: Diagnosis not present

## 2013-02-13 DIAGNOSIS — M76899 Other specified enthesopathies of unspecified lower limb, excluding foot: Secondary | ICD-10-CM | POA: Diagnosis not present

## 2013-02-13 DIAGNOSIS — M4712 Other spondylosis with myelopathy, cervical region: Secondary | ICD-10-CM | POA: Diagnosis not present

## 2013-02-13 DIAGNOSIS — M67919 Unspecified disorder of synovium and tendon, unspecified shoulder: Secondary | ICD-10-CM | POA: Diagnosis not present

## 2013-02-13 DIAGNOSIS — M25559 Pain in unspecified hip: Secondary | ICD-10-CM | POA: Diagnosis not present

## 2013-02-17 DIAGNOSIS — M542 Cervicalgia: Secondary | ICD-10-CM | POA: Diagnosis not present

## 2013-02-17 DIAGNOSIS — M5030 Other cervical disc degeneration, unspecified cervical region: Secondary | ICD-10-CM | POA: Diagnosis not present

## 2013-02-17 DIAGNOSIS — M67919 Unspecified disorder of synovium and tendon, unspecified shoulder: Secondary | ICD-10-CM | POA: Diagnosis not present

## 2013-02-17 DIAGNOSIS — M25559 Pain in unspecified hip: Secondary | ICD-10-CM | POA: Diagnosis not present

## 2013-02-25 DIAGNOSIS — M4712 Other spondylosis with myelopathy, cervical region: Secondary | ICD-10-CM | POA: Diagnosis not present

## 2013-02-25 DIAGNOSIS — IMO0001 Reserved for inherently not codable concepts without codable children: Secondary | ICD-10-CM | POA: Diagnosis not present

## 2013-02-25 DIAGNOSIS — M76899 Other specified enthesopathies of unspecified lower limb, excluding foot: Secondary | ICD-10-CM | POA: Diagnosis not present

## 2013-02-25 DIAGNOSIS — M67919 Unspecified disorder of synovium and tendon, unspecified shoulder: Secondary | ICD-10-CM | POA: Diagnosis not present

## 2013-02-25 DIAGNOSIS — M25559 Pain in unspecified hip: Secondary | ICD-10-CM | POA: Diagnosis not present

## 2013-02-25 DIAGNOSIS — M25519 Pain in unspecified shoulder: Secondary | ICD-10-CM | POA: Diagnosis not present

## 2013-03-07 DIAGNOSIS — IMO0001 Reserved for inherently not codable concepts without codable children: Secondary | ICD-10-CM | POA: Diagnosis not present

## 2013-03-07 DIAGNOSIS — M4712 Other spondylosis with myelopathy, cervical region: Secondary | ICD-10-CM | POA: Diagnosis not present

## 2013-03-07 DIAGNOSIS — M67919 Unspecified disorder of synovium and tendon, unspecified shoulder: Secondary | ICD-10-CM | POA: Diagnosis not present

## 2013-03-07 DIAGNOSIS — M25559 Pain in unspecified hip: Secondary | ICD-10-CM | POA: Diagnosis not present

## 2013-03-07 DIAGNOSIS — M76899 Other specified enthesopathies of unspecified lower limb, excluding foot: Secondary | ICD-10-CM | POA: Diagnosis not present

## 2013-03-07 DIAGNOSIS — M25519 Pain in unspecified shoulder: Secondary | ICD-10-CM | POA: Diagnosis not present

## 2013-03-13 DIAGNOSIS — IMO0001 Reserved for inherently not codable concepts without codable children: Secondary | ICD-10-CM | POA: Diagnosis not present

## 2013-03-13 DIAGNOSIS — M4712 Other spondylosis with myelopathy, cervical region: Secondary | ICD-10-CM | POA: Diagnosis not present

## 2013-03-13 DIAGNOSIS — M25519 Pain in unspecified shoulder: Secondary | ICD-10-CM | POA: Diagnosis not present

## 2013-03-13 DIAGNOSIS — M76899 Other specified enthesopathies of unspecified lower limb, excluding foot: Secondary | ICD-10-CM | POA: Diagnosis not present

## 2013-03-13 DIAGNOSIS — M25559 Pain in unspecified hip: Secondary | ICD-10-CM | POA: Diagnosis not present

## 2013-03-13 DIAGNOSIS — M542 Cervicalgia: Secondary | ICD-10-CM | POA: Diagnosis not present

## 2013-03-13 DIAGNOSIS — M67919 Unspecified disorder of synovium and tendon, unspecified shoulder: Secondary | ICD-10-CM | POA: Diagnosis not present

## 2013-03-19 DIAGNOSIS — IMO0001 Reserved for inherently not codable concepts without codable children: Secondary | ICD-10-CM | POA: Diagnosis not present

## 2013-03-19 DIAGNOSIS — M76899 Other specified enthesopathies of unspecified lower limb, excluding foot: Secondary | ICD-10-CM | POA: Diagnosis not present

## 2013-03-19 DIAGNOSIS — M67919 Unspecified disorder of synovium and tendon, unspecified shoulder: Secondary | ICD-10-CM | POA: Diagnosis not present

## 2013-03-19 DIAGNOSIS — M4712 Other spondylosis with myelopathy, cervical region: Secondary | ICD-10-CM | POA: Diagnosis not present

## 2013-03-19 DIAGNOSIS — M25559 Pain in unspecified hip: Secondary | ICD-10-CM | POA: Diagnosis not present

## 2013-03-19 DIAGNOSIS — M25519 Pain in unspecified shoulder: Secondary | ICD-10-CM | POA: Diagnosis not present

## 2013-03-20 DIAGNOSIS — M25559 Pain in unspecified hip: Secondary | ICD-10-CM | POA: Diagnosis not present

## 2013-03-20 DIAGNOSIS — IMO0001 Reserved for inherently not codable concepts without codable children: Secondary | ICD-10-CM | POA: Diagnosis not present

## 2013-03-20 DIAGNOSIS — M4712 Other spondylosis with myelopathy, cervical region: Secondary | ICD-10-CM | POA: Diagnosis not present

## 2013-03-20 DIAGNOSIS — M25519 Pain in unspecified shoulder: Secondary | ICD-10-CM | POA: Diagnosis not present

## 2013-03-20 DIAGNOSIS — M76899 Other specified enthesopathies of unspecified lower limb, excluding foot: Secondary | ICD-10-CM | POA: Diagnosis not present

## 2013-03-20 DIAGNOSIS — M67919 Unspecified disorder of synovium and tendon, unspecified shoulder: Secondary | ICD-10-CM | POA: Diagnosis not present

## 2013-03-24 DIAGNOSIS — M5030 Other cervical disc degeneration, unspecified cervical region: Secondary | ICD-10-CM | POA: Diagnosis not present

## 2013-03-24 DIAGNOSIS — M67919 Unspecified disorder of synovium and tendon, unspecified shoulder: Secondary | ICD-10-CM | POA: Diagnosis not present

## 2013-03-24 DIAGNOSIS — M542 Cervicalgia: Secondary | ICD-10-CM | POA: Diagnosis not present

## 2013-03-24 DIAGNOSIS — M25559 Pain in unspecified hip: Secondary | ICD-10-CM | POA: Diagnosis not present

## 2013-03-25 DIAGNOSIS — M25519 Pain in unspecified shoulder: Secondary | ICD-10-CM | POA: Diagnosis not present

## 2013-03-25 DIAGNOSIS — M4712 Other spondylosis with myelopathy, cervical region: Secondary | ICD-10-CM | POA: Diagnosis not present

## 2013-03-25 DIAGNOSIS — M67919 Unspecified disorder of synovium and tendon, unspecified shoulder: Secondary | ICD-10-CM | POA: Diagnosis not present

## 2013-03-25 DIAGNOSIS — IMO0001 Reserved for inherently not codable concepts without codable children: Secondary | ICD-10-CM | POA: Diagnosis not present

## 2013-03-25 DIAGNOSIS — M25559 Pain in unspecified hip: Secondary | ICD-10-CM | POA: Diagnosis not present

## 2013-03-25 DIAGNOSIS — M76899 Other specified enthesopathies of unspecified lower limb, excluding foot: Secondary | ICD-10-CM | POA: Diagnosis not present

## 2013-03-27 DIAGNOSIS — M4712 Other spondylosis with myelopathy, cervical region: Secondary | ICD-10-CM | POA: Diagnosis not present

## 2013-03-27 DIAGNOSIS — IMO0001 Reserved for inherently not codable concepts without codable children: Secondary | ICD-10-CM | POA: Diagnosis not present

## 2013-03-27 DIAGNOSIS — M76899 Other specified enthesopathies of unspecified lower limb, excluding foot: Secondary | ICD-10-CM | POA: Diagnosis not present

## 2013-03-27 DIAGNOSIS — M25519 Pain in unspecified shoulder: Secondary | ICD-10-CM | POA: Diagnosis not present

## 2013-03-27 DIAGNOSIS — M67919 Unspecified disorder of synovium and tendon, unspecified shoulder: Secondary | ICD-10-CM | POA: Diagnosis not present

## 2013-03-27 DIAGNOSIS — M25559 Pain in unspecified hip: Secondary | ICD-10-CM | POA: Diagnosis not present

## 2013-04-02 ENCOUNTER — Ambulatory Visit (INDEPENDENT_AMBULATORY_CARE_PROVIDER_SITE_OTHER): Payer: Medicare Other

## 2013-04-02 DIAGNOSIS — Z23 Encounter for immunization: Secondary | ICD-10-CM

## 2013-04-02 DIAGNOSIS — M67919 Unspecified disorder of synovium and tendon, unspecified shoulder: Secondary | ICD-10-CM | POA: Diagnosis not present

## 2013-04-02 DIAGNOSIS — M4712 Other spondylosis with myelopathy, cervical region: Secondary | ICD-10-CM | POA: Diagnosis not present

## 2013-04-02 DIAGNOSIS — M76899 Other specified enthesopathies of unspecified lower limb, excluding foot: Secondary | ICD-10-CM | POA: Diagnosis not present

## 2013-04-02 DIAGNOSIS — M25519 Pain in unspecified shoulder: Secondary | ICD-10-CM | POA: Diagnosis not present

## 2013-04-02 DIAGNOSIS — IMO0001 Reserved for inherently not codable concepts without codable children: Secondary | ICD-10-CM | POA: Diagnosis not present

## 2013-04-02 DIAGNOSIS — M25559 Pain in unspecified hip: Secondary | ICD-10-CM | POA: Diagnosis not present

## 2013-04-09 DIAGNOSIS — M4712 Other spondylosis with myelopathy, cervical region: Secondary | ICD-10-CM | POA: Diagnosis not present

## 2013-04-09 DIAGNOSIS — IMO0001 Reserved for inherently not codable concepts without codable children: Secondary | ICD-10-CM | POA: Diagnosis not present

## 2013-04-09 DIAGNOSIS — M67919 Unspecified disorder of synovium and tendon, unspecified shoulder: Secondary | ICD-10-CM | POA: Diagnosis not present

## 2013-04-09 DIAGNOSIS — M76899 Other specified enthesopathies of unspecified lower limb, excluding foot: Secondary | ICD-10-CM | POA: Diagnosis not present

## 2013-04-09 DIAGNOSIS — M25519 Pain in unspecified shoulder: Secondary | ICD-10-CM | POA: Diagnosis not present

## 2013-04-09 DIAGNOSIS — M25559 Pain in unspecified hip: Secondary | ICD-10-CM | POA: Diagnosis not present

## 2013-05-13 DIAGNOSIS — H4011X0 Primary open-angle glaucoma, stage unspecified: Secondary | ICD-10-CM | POA: Diagnosis not present

## 2013-05-13 DIAGNOSIS — H409 Unspecified glaucoma: Secondary | ICD-10-CM | POA: Diagnosis not present

## 2013-07-31 ENCOUNTER — Telehealth: Payer: Self-pay | Admitting: *Deleted

## 2013-07-31 NOTE — Telephone Encounter (Signed)
Spoke with patient and he will come for labs next week. 

## 2013-07-31 NOTE — Telephone Encounter (Signed)
Message copied by Hulan Saas on Thu Jul 31, 2013  9:22 AM ------      Message from: Hulan Saas      Created: Tue Feb 04, 2013 10:05 AM       Call and remind patient due for B12 for DB 08/04/13. Lab in [facility]. ------

## 2013-08-11 ENCOUNTER — Telehealth: Payer: Self-pay | Admitting: *Deleted

## 2013-08-11 ENCOUNTER — Encounter: Payer: Self-pay | Admitting: *Deleted

## 2013-08-11 NOTE — Telephone Encounter (Signed)
Patient has not had labs letter mailed to patient.

## 2013-08-11 NOTE — Telephone Encounter (Signed)
Message copied by Hulan Saas on Mon Aug 11, 2013 11:26 AM ------      Message from: Hulan Saas      Created: Thu Jul 31, 2013  9:26 AM       Did patient come for B12 level for DB ------

## 2013-08-12 DIAGNOSIS — H409 Unspecified glaucoma: Secondary | ICD-10-CM | POA: Diagnosis not present

## 2013-08-12 DIAGNOSIS — H4011X0 Primary open-angle glaucoma, stage unspecified: Secondary | ICD-10-CM | POA: Diagnosis not present

## 2013-08-14 ENCOUNTER — Other Ambulatory Visit (INDEPENDENT_AMBULATORY_CARE_PROVIDER_SITE_OTHER): Payer: Medicare Other

## 2013-08-14 DIAGNOSIS — E538 Deficiency of other specified B group vitamins: Secondary | ICD-10-CM | POA: Diagnosis not present

## 2013-08-14 DIAGNOSIS — K5730 Diverticulosis of large intestine without perforation or abscess without bleeding: Secondary | ICD-10-CM | POA: Diagnosis not present

## 2013-08-14 LAB — VITAMIN B12: Vitamin B-12: 317 pg/mL (ref 211–911)

## 2013-08-15 ENCOUNTER — Other Ambulatory Visit: Payer: Self-pay | Admitting: *Deleted

## 2013-08-15 DIAGNOSIS — E538 Deficiency of other specified B group vitamins: Secondary | ICD-10-CM

## 2013-12-02 DIAGNOSIS — H2510 Age-related nuclear cataract, unspecified eye: Secondary | ICD-10-CM | POA: Diagnosis not present

## 2013-12-02 DIAGNOSIS — H524 Presbyopia: Secondary | ICD-10-CM | POA: Diagnosis not present

## 2013-12-02 DIAGNOSIS — H4011X0 Primary open-angle glaucoma, stage unspecified: Secondary | ICD-10-CM | POA: Diagnosis not present

## 2013-12-02 DIAGNOSIS — H409 Unspecified glaucoma: Secondary | ICD-10-CM | POA: Diagnosis not present

## 2013-12-02 DIAGNOSIS — H35039 Hypertensive retinopathy, unspecified eye: Secondary | ICD-10-CM | POA: Diagnosis not present

## 2014-03-03 DIAGNOSIS — H4011X0 Primary open-angle glaucoma, stage unspecified: Secondary | ICD-10-CM | POA: Diagnosis not present

## 2014-03-17 ENCOUNTER — Encounter: Payer: Self-pay | Admitting: Internal Medicine

## 2014-03-17 ENCOUNTER — Ambulatory Visit (INDEPENDENT_AMBULATORY_CARE_PROVIDER_SITE_OTHER): Payer: Medicare Other | Admitting: Internal Medicine

## 2014-03-17 ENCOUNTER — Other Ambulatory Visit (INDEPENDENT_AMBULATORY_CARE_PROVIDER_SITE_OTHER): Payer: Medicare Other

## 2014-03-17 VITALS — BP 122/74 | HR 62 | Temp 98.0°F | Ht 65.5 in | Wt 145.0 lb

## 2014-03-17 DIAGNOSIS — K5730 Diverticulosis of large intestine without perforation or abscess without bleeding: Secondary | ICD-10-CM

## 2014-03-17 DIAGNOSIS — I10 Essential (primary) hypertension: Secondary | ICD-10-CM

## 2014-03-17 DIAGNOSIS — N400 Enlarged prostate without lower urinary tract symptoms: Secondary | ICD-10-CM

## 2014-03-17 DIAGNOSIS — Z23 Encounter for immunization: Secondary | ICD-10-CM

## 2014-03-17 DIAGNOSIS — R634 Abnormal weight loss: Secondary | ICD-10-CM

## 2014-03-17 DIAGNOSIS — I4510 Unspecified right bundle-branch block: Secondary | ICD-10-CM | POA: Diagnosis not present

## 2014-03-17 DIAGNOSIS — E785 Hyperlipidemia, unspecified: Secondary | ICD-10-CM

## 2014-03-17 LAB — CBC WITH DIFFERENTIAL/PLATELET
Basophils Absolute: 0 10*3/uL (ref 0.0–0.1)
Basophils Relative: 0.6 % (ref 0.0–3.0)
Eosinophils Absolute: 0.1 10*3/uL (ref 0.0–0.7)
Eosinophils Relative: 1.9 % (ref 0.0–5.0)
HCT: 39.1 % (ref 39.0–52.0)
Hemoglobin: 13 g/dL (ref 13.0–17.0)
Lymphocytes Relative: 33.3 % (ref 12.0–46.0)
Lymphs Abs: 1.2 10*3/uL (ref 0.7–4.0)
MCHC: 33.2 g/dL (ref 30.0–36.0)
MCV: 94.3 fl (ref 78.0–100.0)
Monocytes Absolute: 0.4 10*3/uL (ref 0.1–1.0)
Monocytes Relative: 11.4 % (ref 3.0–12.0)
Neutro Abs: 1.9 10*3/uL (ref 1.4–7.7)
Neutrophils Relative %: 52.8 % (ref 43.0–77.0)
Platelets: 178 10*3/uL (ref 150.0–400.0)
RBC: 4.15 Mil/uL — ABNORMAL LOW (ref 4.22–5.81)
RDW: 13.9 % (ref 11.5–15.5)
WBC: 3.6 10*3/uL — ABNORMAL LOW (ref 4.0–10.5)

## 2014-03-17 NOTE — Patient Instructions (Addendum)
Please remember to go to the lab   department downstairs for your tests - we will call you with the results when they are available.
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
## 2014-03-18 LAB — HEPATIC FUNCTION PANEL
ALT: 14 U/L (ref 0–53)
AST: 25 U/L (ref 0–37)
Albumin: 4.3 g/dL (ref 3.5–5.2)
Alkaline Phosphatase: 145 U/L — ABNORMAL HIGH (ref 39–117)
Bilirubin, Direct: 0.1 mg/dL (ref 0.0–0.3)
Total Bilirubin: 0.7 mg/dL (ref 0.2–1.2)
Total Protein: 7.7 g/dL (ref 6.0–8.3)

## 2014-03-18 LAB — BASIC METABOLIC PANEL
BUN: 18 mg/dL (ref 6–23)
CO2: 27 mEq/L (ref 19–32)
Calcium: 9.5 mg/dL (ref 8.4–10.5)
Chloride: 105 mEq/L (ref 96–112)
Creatinine, Ser: 1.1 mg/dL (ref 0.4–1.5)
GFR: 79.14 mL/min (ref >60.00)
Glucose, Bld: 79 mg/dL (ref 70–99)
Potassium: 4.7 mEq/L (ref 3.5–5.1)
Sodium: 143 mEq/L (ref 135–145)

## 2014-03-18 LAB — LIPID PANEL
Cholesterol: 208 mg/dL — ABNORMAL HIGH (ref 0–200)
HDL: 79.2 mg/dL (ref >39.00)
LDL Cholesterol: 121 mg/dL — ABNORMAL HIGH (ref 0–99)
NonHDL: 128.8
Total CHOL/HDL Ratio: 3
Triglycerides: 40 mg/dL (ref 0.0–149.0)
VLDL: 8 mg/dL (ref 0.0–40.0)

## 2014-03-18 LAB — TSH: TSH: 2.53 u[IU]/mL (ref 0.35–4.50)

## 2014-03-18 NOTE — Progress Notes (Signed)
Quick Note:  Pt aware ______ 

## 2014-03-19 ENCOUNTER — Telehealth: Payer: Self-pay | Admitting: *Deleted

## 2014-03-19 MED ORDER — CLOTRIMAZOLE 1 % EX CREA: 1.0000 "application " | TOPICAL_CREAM | Freq: Two times a day (BID) | CUTANEOUS | Status: DC | PRN

## 2014-03-19 NOTE — Assessment & Plan Note (Signed)
No symptoms, no need for rx

## 2014-03-19 NOTE — Assessment & Plan Note (Signed)
Target ldl < 130 due to male/ h/o hbp  Adequate control on present rx, reviewed > no change in rx needed

## 2014-03-19 NOTE — Assessment & Plan Note (Signed)
Lab Results  Component Value Date   HGB 13.0 03/17/2014   HGB 12.1* 11/05/2012   HGB 12.1* 09/12/2012     Adequate control on present rx, reviewed > no change in rx needed

## 2014-03-19 NOTE — Assessment & Plan Note (Signed)
Wt Readings from Last 3 Encounters:  03/17/14 145 lb (65.772 kg)  11/05/12 146 lb 6.4 oz (66.407 kg)  09/12/12 153 lb (69.4 kg)     stable though not back to baseline/ tsh ok > no change rx

## 2014-03-19 NOTE — Telephone Encounter (Signed)
He's correct :  Lotrimin 1% apply bid prn #30 gm refillx 5

## 2014-03-19 NOTE — Assessment & Plan Note (Signed)
No change RBBB or higher level block > no rx needed

## 2014-03-19 NOTE — Assessment & Plan Note (Signed)
Adequate control on present rx, reviewed > no change in rx needed  (no salt/ no meds)

## 2014-03-19 NOTE — Telephone Encounter (Signed)
Rx sent, pt notified via voicemail (per pt's request).

## 2014-05-22 DIAGNOSIS — Z961 Presence of intraocular lens: Secondary | ICD-10-CM | POA: Diagnosis not present

## 2014-05-22 DIAGNOSIS — H4011X3 Primary open-angle glaucoma, severe stage: Secondary | ICD-10-CM | POA: Diagnosis not present

## 2014-05-22 DIAGNOSIS — H2512 Age-related nuclear cataract, left eye: Secondary | ICD-10-CM | POA: Diagnosis not present

## 2014-05-22 DIAGNOSIS — Z83511 Family history of glaucoma: Secondary | ICD-10-CM | POA: Diagnosis not present

## 2014-07-14 DIAGNOSIS — H18232 Secondary corneal edema, left eye: Secondary | ICD-10-CM | POA: Diagnosis not present

## 2014-07-14 DIAGNOSIS — H11432 Conjunctival hyperemia, left eye: Secondary | ICD-10-CM | POA: Diagnosis not present

## 2014-07-14 DIAGNOSIS — H16212 Exposure keratoconjunctivitis, left eye: Secondary | ICD-10-CM | POA: Diagnosis not present

## 2014-07-14 DIAGNOSIS — H4011X3 Primary open-angle glaucoma, severe stage: Secondary | ICD-10-CM | POA: Diagnosis not present

## 2014-07-21 DIAGNOSIS — H4011X3 Primary open-angle glaucoma, severe stage: Secondary | ICD-10-CM | POA: Diagnosis not present

## 2014-07-21 DIAGNOSIS — H18232 Secondary corneal edema, left eye: Secondary | ICD-10-CM | POA: Diagnosis not present

## 2014-07-21 DIAGNOSIS — H16212 Exposure keratoconjunctivitis, left eye: Secondary | ICD-10-CM | POA: Diagnosis not present

## 2014-07-21 DIAGNOSIS — H11432 Conjunctival hyperemia, left eye: Secondary | ICD-10-CM | POA: Diagnosis not present

## 2014-07-31 DIAGNOSIS — H18232 Secondary corneal edema, left eye: Secondary | ICD-10-CM | POA: Diagnosis not present

## 2014-07-31 DIAGNOSIS — H211X2 Other vascular disorders of iris and ciliary body, left eye: Secondary | ICD-10-CM | POA: Diagnosis not present

## 2014-07-31 DIAGNOSIS — H4052X2 Glaucoma secondary to other eye disorders, left eye, moderate stage: Secondary | ICD-10-CM | POA: Diagnosis not present

## 2014-07-31 DIAGNOSIS — H16212 Exposure keratoconjunctivitis, left eye: Secondary | ICD-10-CM | POA: Diagnosis not present

## 2014-07-31 DIAGNOSIS — H11432 Conjunctival hyperemia, left eye: Secondary | ICD-10-CM | POA: Diagnosis not present

## 2014-08-17 ENCOUNTER — Telehealth: Payer: Self-pay | Admitting: *Deleted

## 2014-08-17 NOTE — Telephone Encounter (Signed)
-----   Message from Hulan Saas, RN sent at 08/15/2013  9:28 AM EST ----- Call and remind patient due for B 12 level 08/17/14 for DB

## 2014-08-17 NOTE — Telephone Encounter (Signed)
Spoke with patient's wife and he will come for labs.

## 2014-08-18 ENCOUNTER — Other Ambulatory Visit (INDEPENDENT_AMBULATORY_CARE_PROVIDER_SITE_OTHER): Payer: Medicare Other

## 2014-08-18 DIAGNOSIS — E538 Deficiency of other specified B group vitamins: Secondary | ICD-10-CM

## 2014-08-18 LAB — VITAMIN B12: Vitamin B-12: 320 pg/mL (ref 211–911)

## 2014-08-20 ENCOUNTER — Other Ambulatory Visit: Payer: Self-pay | Admitting: *Deleted

## 2014-08-20 DIAGNOSIS — E538 Deficiency of other specified B group vitamins: Secondary | ICD-10-CM

## 2014-08-25 DIAGNOSIS — H4011X3 Primary open-angle glaucoma, severe stage: Secondary | ICD-10-CM | POA: Diagnosis not present

## 2014-08-25 DIAGNOSIS — H4052X2 Glaucoma secondary to other eye disorders, left eye, moderate stage: Secondary | ICD-10-CM | POA: Diagnosis not present

## 2014-11-20 ENCOUNTER — Telehealth: Payer: Self-pay | Admitting: Internal Medicine

## 2014-11-20 ENCOUNTER — Ambulatory Visit (INDEPENDENT_AMBULATORY_CARE_PROVIDER_SITE_OTHER): Payer: Medicare Other | Admitting: Internal Medicine

## 2014-11-20 ENCOUNTER — Encounter: Payer: Self-pay | Admitting: Internal Medicine

## 2014-11-20 ENCOUNTER — Ambulatory Visit (INDEPENDENT_AMBULATORY_CARE_PROVIDER_SITE_OTHER)
Admission: RE | Admit: 2014-11-20 | Discharge: 2014-11-20 | Disposition: A | Discharge status: Home / Self Care | Payer: Medicare Other | Source: Ambulatory Visit | Attending: Internal Medicine | Admitting: Internal Medicine

## 2014-11-20 ENCOUNTER — Other Ambulatory Visit (INDEPENDENT_AMBULATORY_CARE_PROVIDER_SITE_OTHER): Payer: Medicare Other

## 2014-11-20 ENCOUNTER — Encounter (INDEPENDENT_AMBULATORY_CARE_PROVIDER_SITE_OTHER): Payer: Self-pay

## 2014-11-20 VITALS — BP 118/62 | HR 52 | Ht 70.0 in | Wt 152.0 lb

## 2014-11-20 DIAGNOSIS — R0600 Dyspnea, unspecified: Secondary | ICD-10-CM

## 2014-11-20 DIAGNOSIS — F6811 Factitious disorder with predominantly psychological signs and symptoms: Secondary | ICD-10-CM | POA: Diagnosis not present

## 2014-11-20 DIAGNOSIS — J449 Chronic obstructive pulmonary disease, unspecified: Secondary | ICD-10-CM | POA: Diagnosis not present

## 2014-11-20 DIAGNOSIS — R0602 Shortness of breath: Secondary | ICD-10-CM | POA: Diagnosis not present

## 2014-11-20 LAB — CBC WITH DIFFERENTIAL/PLATELET
Basophils Absolute: 0 10*3/uL (ref 0.0–0.1)
Basophils Relative: 0.8 % (ref 0.0–3.0)
Eosinophils Absolute: 0.1 10*3/uL (ref 0.0–0.7)
Eosinophils Relative: 3.2 % (ref 0.0–5.0)
HCT: 36.8 % — ABNORMAL LOW (ref 39.0–52.0)
Hemoglobin: 12.2 g/dL — ABNORMAL LOW (ref 13.0–17.0)
Lymphocytes Relative: 31.5 % (ref 12.0–46.0)
Lymphs Abs: 1.1 10*3/uL (ref 0.7–4.0)
MCHC: 33.1 g/dL (ref 30.0–36.0)
MCV: 95 fl (ref 78.0–100.0)
Monocytes Absolute: 0.3 10*3/uL (ref 0.1–1.0)
Monocytes Relative: 10.3 % (ref 3.0–12.0)
Neutro Abs: 1.8 10*3/uL (ref 1.4–7.7)
Neutrophils Relative %: 54.2 % (ref 43.0–77.0)
Platelets: 146 10*3/uL — ABNORMAL LOW (ref 150.0–400.0)
RBC: 3.87 Mil/uL — ABNORMAL LOW (ref 4.22–5.81)
RDW: 13.5 % (ref 11.5–15.5)
WBC: 3.4 10*3/uL — ABNORMAL LOW (ref 4.0–10.5)

## 2014-11-20 LAB — HEPATIC FUNCTION PANEL
ALT: 10 U/L (ref 0–53)
AST: 20 U/L (ref 0–37)
Albumin: 3.9 g/dL (ref 3.5–5.2)
Alkaline Phosphatase: 150 U/L — ABNORMAL HIGH (ref 39–117)
Bilirubin, Direct: 0.1 mg/dL (ref 0.0–0.3)
Total Bilirubin: 0.6 mg/dL (ref 0.2–1.2)
Total Protein: 6.9 g/dL (ref 6.0–8.3)

## 2014-11-20 LAB — TSH: TSH: 1.55 u[IU]/mL (ref 0.35–4.50)

## 2014-11-20 LAB — BASIC METABOLIC PANEL
BUN: 13 mg/dL (ref 6–23)
CO2: 31 mEq/L (ref 19–32)
Calcium: 9.7 mg/dL (ref 8.4–10.5)
Chloride: 107 mEq/L (ref 96–112)
Creatinine, Ser: 1.15 mg/dL (ref 0.40–1.50)
GFR: 77.43 mL/min (ref >60.00)
Glucose, Bld: 85 mg/dL (ref 70–99)
Potassium: 5 mEq/L (ref 3.5–5.1)
Sodium: 143 mEq/L (ref 135–145)

## 2014-11-20 LAB — BRAIN NATRIURETIC PEPTIDE: Pro B Natriuretic peptide (BNP): 101 pg/mL — ABNORMAL HIGH (ref 0.0–100.0)

## 2014-11-20 MED ORDER — TRAZODONE HCL 50 MG PO TABS: 50.0000 mg | ORAL_TABLET | Freq: Every day | ORAL | Status: DC

## 2014-11-20 NOTE — Progress Notes (Signed)
Quick Note:  Spoke with pt and notified of results per Dr. Wert. Pt verbalized understanding and denied any questions.  ______ 

## 2014-11-20 NOTE — Patient Instructions (Addendum)
Start trazadone 50 mg one half daily at bedtime x 3 weeks then one at bedtime  Please schedule a follow up office visit in 6 weeks, call sooner if needed  Late add needs prevnar 13 next ov and MMSE
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
## 2014-11-20 NOTE — Telephone Encounter (Signed)
Pt was seen this morning and wanted a handicap placard form but was not given one.  This has been approved by MW and filled out appropriately.  Pt is downstairs getting labs but form has been left up front for pickup.  Nothing further needed.

## 2014-11-21 ENCOUNTER — Encounter: Payer: Self-pay | Admitting: Internal Medicine

## 2014-11-21 NOTE — Assessment & Plan Note (Signed)
He is more fatigued than sob > try trazodone first

## 2014-11-21 NOTE — Assessment & Plan Note (Addendum)
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  Please see instructions for details which were reviewed in writing and the patient given a copy.

## 2015-01-01 ENCOUNTER — Encounter: Payer: Self-pay | Admitting: Internal Medicine

## 2015-01-01 ENCOUNTER — Other Ambulatory Visit (INDEPENDENT_AMBULATORY_CARE_PROVIDER_SITE_OTHER): Payer: Medicare Other

## 2015-01-01 ENCOUNTER — Ambulatory Visit (INDEPENDENT_AMBULATORY_CARE_PROVIDER_SITE_OTHER): Payer: Medicare Other | Admitting: Internal Medicine

## 2015-01-01 VITALS — BP 108/80 | HR 60 | Ht 70.0 in | Wt 143.0 lb

## 2015-01-01 DIAGNOSIS — M25512 Pain in left shoulder: Secondary | ICD-10-CM

## 2015-01-01 DIAGNOSIS — F6811 Factitious disorder with predominantly psychological signs and symptoms: Secondary | ICD-10-CM

## 2015-01-01 DIAGNOSIS — E538 Deficiency of other specified B group vitamins: Secondary | ICD-10-CM | POA: Diagnosis not present

## 2015-01-01 LAB — VITAMIN B12: Vitamin B-12: 340 pg/mL (ref 211–911)

## 2015-01-01 LAB — CBC WITH DIFFERENTIAL/PLATELET
Basophils Absolute: 0 10*3/uL (ref 0.0–0.1)
Basophils Relative: 0.4 % (ref 0.0–3.0)
Eosinophils Absolute: 0.1 10*3/uL (ref 0.0–0.7)
Eosinophils Relative: 2.2 % (ref 0.0–5.0)
HCT: 37.7 % — ABNORMAL LOW (ref 39.0–52.0)
Hemoglobin: 12.6 g/dL — ABNORMAL LOW (ref 13.0–17.0)
Lymphocytes Relative: 28.1 % (ref 12.0–46.0)
Lymphs Abs: 1.2 10*3/uL (ref 0.7–4.0)
MCHC: 33.4 g/dL (ref 30.0–36.0)
MCV: 93.6 fl (ref 78.0–100.0)
Monocytes Absolute: 0.5 10*3/uL (ref 0.1–1.0)
Monocytes Relative: 12.2 % — ABNORMAL HIGH (ref 3.0–12.0)
Neutro Abs: 2.5 10*3/uL (ref 1.4–7.7)
Neutrophils Relative %: 57.1 % (ref 43.0–77.0)
Platelets: 154 10*3/uL (ref 150.0–400.0)
RBC: 4.03 Mil/uL — ABNORMAL LOW (ref 4.22–5.81)
RDW: 13.4 % (ref 11.5–15.5)
WBC: 4.3 10*3/uL (ref 4.0–10.5)
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
## 2015-01-01 NOTE — Patient Instructions (Addendum)
Try anaprox with meals as needed and if not satisfied need to return Dr Patrice Paradise  Please remember to go to the lab  department downstairs for your tests - we will call you with the results when they are available.  Centrum A to Z one daily   Please schedule a follow up office visit in 6 weeks, call sooner if needed with Tammy NP for MMSE

## 2015-01-02 ENCOUNTER — Encounter: Payer: Self-pay | Admitting: Internal Medicine

## 2015-01-02 NOTE — Assessment & Plan Note (Signed)
No evidence of this, b12 higher than the last one checked off all rx  No indication for rx

## 2015-01-02 NOTE — Assessment & Plan Note (Signed)
Exam is nl, needs to try anaprox and refer back to Dr Patrice Paradise if not better

## 2015-01-02 NOTE — Assessment & Plan Note (Addendum)
-   restarted trazadone 11/21/2014 > could not tolerate rx though all his symptoms resolved on it before so this may be real dementia vs intolerance to trazadone now  Needs to return for MMSE and neuro referral prn vs start a gentler med like zoloft 25 mg daily trial  if the deficit is only mild

## 2015-01-04 NOTE — Progress Notes (Signed)
Quick Note:  Spoke with pt and notified of results per Dr. Wert. Pt verbalized understanding and denied any questions.  ______ 

## 2015-01-29 DIAGNOSIS — H5202 Hypermetropia, left eye: Secondary | ICD-10-CM | POA: Diagnosis not present

## 2015-01-29 DIAGNOSIS — H4011X3 Primary open-angle glaucoma, severe stage: Secondary | ICD-10-CM | POA: Diagnosis not present

## 2015-01-29 DIAGNOSIS — H4052X2 Glaucoma secondary to other eye disorders, left eye, moderate stage: Secondary | ICD-10-CM | POA: Diagnosis not present

## 2015-01-29 DIAGNOSIS — Z83511 Family history of glaucoma: Secondary | ICD-10-CM | POA: Diagnosis not present

## 2015-01-29 DIAGNOSIS — H2512 Age-related nuclear cataract, left eye: Secondary | ICD-10-CM | POA: Diagnosis not present

## 2015-01-29 DIAGNOSIS — H16212 Exposure keratoconjunctivitis, left eye: Secondary | ICD-10-CM | POA: Diagnosis not present

## 2015-01-29 DIAGNOSIS — H44512 Absolute glaucoma, left eye: Secondary | ICD-10-CM | POA: Diagnosis not present

## 2015-01-29 DIAGNOSIS — Z961 Presence of intraocular lens: Secondary | ICD-10-CM | POA: Diagnosis not present

## 2015-02-12 ENCOUNTER — Ambulatory Visit (INDEPENDENT_AMBULATORY_CARE_PROVIDER_SITE_OTHER): Payer: Medicare Other | Admitting: Adult Health

## 2015-02-12 ENCOUNTER — Ambulatory Visit: Payer: Medicare Other | Admitting: Internal Medicine

## 2015-02-12 ENCOUNTER — Encounter: Payer: Self-pay | Admitting: Adult Health

## 2015-02-12 VITALS — BP 112/80 | HR 52 | Temp 98.7°F | Ht 70.0 in | Wt 139.0 lb

## 2015-02-12 DIAGNOSIS — R413 Other amnesia: Secondary | ICD-10-CM | POA: Diagnosis not present

## 2015-02-12 DIAGNOSIS — Z23 Encounter for immunization: Secondary | ICD-10-CM | POA: Diagnosis not present

## 2015-02-12 NOTE — Patient Instructions (Signed)
We will refer you to Neuro as discussed for memory issues  Flu shot today  Follow up Dr. Melvyn Novas  In 3-4 months and As needed
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
## 2015-02-12 NOTE — Progress Notes (Signed)
Chart and office note reviewed in detail  > agree with a/p as outlined    

## 2015-02-12 NOTE — Assessment & Plan Note (Addendum)
Slow progression of memory loss with abnormal MMSE (21) concerning for dementia  Recent labs unrevealing .  Safety issues and brain teasers discussed with wife.   Plan  We will refer you to Neuro as discussed for memory issues  Flu shot today  Follow up Dr. Melvyn Novas  In 3-4 months and As needed

## 2015-03-15 ENCOUNTER — Encounter: Payer: Self-pay | Admitting: Neurology

## 2015-03-15 ENCOUNTER — Ambulatory Visit (INDEPENDENT_AMBULATORY_CARE_PROVIDER_SITE_OTHER): Payer: Medicare Other | Admitting: Neurology

## 2015-03-15 VITALS — BP 118/62 | HR 60 | Ht 67.5 in | Wt 139.0 lb

## 2015-03-15 DIAGNOSIS — F0390 Unspecified dementia without behavioral disturbance: Secondary | ICD-10-CM | POA: Diagnosis not present

## 2015-03-15 DIAGNOSIS — F03A0 Unspecified dementia, mild, without behavioral disturbance, psychotic disturbance, mood disturbance, and anxiety: Secondary | ICD-10-CM

## 2015-03-15 DIAGNOSIS — G3184 Mild cognitive impairment, so stated: Secondary | ICD-10-CM

## 2015-03-15 NOTE — Patient Instructions (Signed)
1. Schedule head CT without contrast 2. Physical exercise, brain stimulation exercises, Mediterranean diet, have been found to be helpful for brain health 3. Option to start memory medicine (Aricept), call our office if you would like to start this 4. Follow-up in 6 months
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
## 2015-03-18 ENCOUNTER — Ambulatory Visit (HOSPITAL_BASED_OUTPATIENT_CLINIC_OR_DEPARTMENT_OTHER)
Admission: RE | Admit: 2015-03-18 | Discharge: 2015-03-18 | Disposition: A | Discharge status: Home / Self Care | Payer: Medicare Other | Source: Ambulatory Visit | Attending: Neurology | Admitting: Neurology

## 2015-03-18 DIAGNOSIS — I739 Peripheral vascular disease, unspecified: Secondary | ICD-10-CM | POA: Insufficient documentation

## 2015-03-18 DIAGNOSIS — G319 Degenerative disease of nervous system, unspecified: Secondary | ICD-10-CM | POA: Insufficient documentation

## 2015-03-18 DIAGNOSIS — R413 Other amnesia: Secondary | ICD-10-CM | POA: Insufficient documentation

## 2015-03-19 ENCOUNTER — Telehealth: Payer: Self-pay | Admitting: Family Medicine

## 2015-03-19 NOTE — Telephone Encounter (Signed)
Patient's wife/[name] was notified of result. She requested a letter with the result information so they could keep that on file. I will mail them a result letter.

## 2015-03-19 NOTE — Telephone Encounter (Signed)
-----   Message from [name], MD sent at 03/19/2015  8:31 AM EDT ----- Pls let family know CT does not show any evidence of tumor, stroke, or bleed. It shows age-related changes. Thanks

## 2015-04-05 ENCOUNTER — Other Ambulatory Visit: Payer: Self-pay | Admitting: Internal Medicine

## 2015-04-05 MED ORDER — CLOTRIMAZOLE 1 % EX CREA: 1.0000 "application " | TOPICAL_CREAM | Freq: Two times a day (BID) | CUTANEOUS | Status: DC | PRN

## 2015-04-28 ENCOUNTER — Encounter: Payer: Self-pay | Admitting: Internal Medicine

## 2015-04-28 ENCOUNTER — Ambulatory Visit (INDEPENDENT_AMBULATORY_CARE_PROVIDER_SITE_OTHER): Payer: Medicare Other | Admitting: Internal Medicine

## 2015-04-28 VITALS — BP 120/62 | HR 117 | Ht 70.0 in | Wt 139.8 lb

## 2015-04-28 DIAGNOSIS — F6811 Factitious disorder with predominantly psychological signs and symptoms: Secondary | ICD-10-CM

## 2015-04-28 DIAGNOSIS — Z0000 Encounter for general adult medical examination without abnormal findings: Secondary | ICD-10-CM | POA: Diagnosis not present

## 2015-04-28 DIAGNOSIS — R413 Other amnesia: Secondary | ICD-10-CM | POA: Diagnosis not present

## 2015-04-28 DIAGNOSIS — Z23 Encounter for immunization: Secondary | ICD-10-CM

## 2015-04-28 HISTORY — DX: Encounter for general adult medical examination without abnormal findings: Z00.00
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
## 2015-04-28 NOTE — Assessment & Plan Note (Signed)
03/15/15 neurology eval > c/w mild cognitive impairment, plan serial eval before considering aricept   - CT head 03/19/2015 > Atrophy with extensive supratentorial small vessel disease. No intracranial mass, hemorrhage, or acute appearing infarct.  Defer f/u to neuro/ encouraged "brain exercise" in meantime and retry trazadone

## 2015-04-28 NOTE — Assessment & Plan Note (Signed)
-   restarted trazadone 11/21/2014 > could not tolerate full rx > 04/28/2015  rec build up to full dose x 3 weeks   He previously responded to trazadone an due to side effects didn't build it back up   rec rechallenge prior to next neuro check in April if tolerates.  I had an extended discussion with the patient and wife reviewing all relevant studies(and neuro notes)  completed to date and  lasting 15 to 20 minutes of a 25 minute visit    Each maintenance medication was reviewed in detail including most importantly the difference between maintenance and prns and under what circumstances the prns are to be triggered using an action plan format that is not reflected in the computer generated alphabetically organized AVS.    Please see instructions for details which were reviewed in writing and the patient given a copy highlighting the part that I personally wrote and discussed at today's ov.

## 2015-04-28 NOTE — Assessment & Plan Note (Signed)
Due for prevnar today > given

## 2015-04-28 NOTE — Patient Instructions (Signed)
Restart trazadone at part of a pill and over 3 weeks build up to a whole pill  Prevnar 13 the last shot he'll ever need  Please schedule a follow up visit in 6 months but call sooner if needed

## 2015-05-11 ENCOUNTER — Ambulatory Visit: Payer: Medicare Other | Admitting: Internal Medicine

## 2015-06-08 DIAGNOSIS — H401133 Primary open-angle glaucoma, bilateral, severe stage: Secondary | ICD-10-CM | POA: Diagnosis not present

## 2015-06-08 DIAGNOSIS — H4052X2 Glaucoma secondary to other eye disorders, left eye, moderate stage: Secondary | ICD-10-CM | POA: Diagnosis not present

## 2015-06-08 DIAGNOSIS — H44512 Absolute glaucoma, left eye: Secondary | ICD-10-CM | POA: Diagnosis not present

## 2015-08-11 ENCOUNTER — Telehealth: Payer: Self-pay | Admitting: *Deleted

## 2015-08-11 NOTE — Telephone Encounter (Signed)
Left a message for patient to call back. 

## 2015-08-11 NOTE — Telephone Encounter (Signed)
-----   Message from Hulan Saas, RN sent at 02/09/2015  3:28 PM EDT ----- No new GI ----- Message -----    From: Hulan Saas, RN    Sent: 08/12/2015      To: Hulan Saas, RN  Call and remind patient due for B12 level for DB. Lab in [facility].

## 2015-08-12 NOTE — Telephone Encounter (Signed)
Left a message for patient to call back. 

## 2015-08-13 NOTE — Telephone Encounter (Signed)
Left a message for patient to call back. 

## 2015-08-13 NOTE — Telephone Encounter (Signed)
Spoke with patient's wife and he will have PCP handle B 12.

## 2015-09-15 ENCOUNTER — Ambulatory Visit (INDEPENDENT_AMBULATORY_CARE_PROVIDER_SITE_OTHER)
Admission: RE | Admit: 2015-09-15 | Discharge: 2015-09-15 | Disposition: A | Discharge status: Home / Self Care | Payer: Medicare Other | Source: Ambulatory Visit | Attending: Internal Medicine | Admitting: Internal Medicine

## 2015-09-15 ENCOUNTER — Ambulatory Visit (INDEPENDENT_AMBULATORY_CARE_PROVIDER_SITE_OTHER): Payer: Medicare Other | Admitting: Internal Medicine

## 2015-09-15 ENCOUNTER — Ambulatory Visit: Payer: Medicare Other | Admitting: Neurology

## 2015-09-15 ENCOUNTER — Other Ambulatory Visit (INDEPENDENT_AMBULATORY_CARE_PROVIDER_SITE_OTHER): Payer: Medicare Other

## 2015-09-15 ENCOUNTER — Encounter: Payer: Self-pay | Admitting: Internal Medicine

## 2015-09-15 VITALS — BP 104/64 | HR 70 | Ht 70.0 in | Wt 139.6 lb

## 2015-09-15 DIAGNOSIS — F6811 Factitious disorder with predominantly psychological signs and symptoms: Secondary | ICD-10-CM

## 2015-09-15 DIAGNOSIS — E785 Hyperlipidemia, unspecified: Secondary | ICD-10-CM

## 2015-09-15 DIAGNOSIS — I10 Essential (primary) hypertension: Secondary | ICD-10-CM

## 2015-09-15 DIAGNOSIS — Z86010 Personal history of colonic polyps: Secondary | ICD-10-CM | POA: Diagnosis not present

## 2015-09-15 DIAGNOSIS — Z0000 Encounter for general adult medical examination without abnormal findings: Secondary | ICD-10-CM | POA: Diagnosis not present

## 2015-09-15 LAB — BASIC METABOLIC PANEL
BUN: 15 mg/dL (ref 6–23)
CO2: 34 mEq/L — ABNORMAL HIGH (ref 19–32)
Calcium: 9.7 mg/dL (ref 8.4–10.5)
Chloride: 103 mEq/L (ref 96–112)
Creatinine, Ser: 1.19 mg/dL (ref 0.40–1.50)
GFR: 74.3 mL/min (ref >60.00)
Glucose, Bld: 93 mg/dL (ref 70–99)
Potassium: 4 mEq/L (ref 3.5–5.1)
Sodium: 143 mEq/L (ref 135–145)

## 2015-09-15 LAB — LIPID PANEL
Cholesterol: 204 mg/dL — ABNORMAL HIGH (ref 0–200)
HDL: 69.9 mg/dL (ref >39.00)
LDL Cholesterol: 122 mg/dL — ABNORMAL HIGH (ref 0–99)
NonHDL: 134.25
Total CHOL/HDL Ratio: 3
Triglycerides: 63 mg/dL (ref 0.0–149.0)
VLDL: 12.6 mg/dL (ref 0.0–40.0)

## 2015-09-15 LAB — CBC WITH DIFFERENTIAL/PLATELET
Basophils Absolute: 0 10*3/uL (ref 0.0–0.1)
Basophils Relative: 0.5 % (ref 0.0–3.0)
Eosinophils Absolute: 0 10*3/uL (ref 0.0–0.7)
Eosinophils Relative: 1.4 % (ref 0.0–5.0)
HCT: 38 % — ABNORMAL LOW (ref 39.0–52.0)
Hemoglobin: 12.5 g/dL — ABNORMAL LOW (ref 13.0–17.0)
Lymphocytes Relative: 36.7 % (ref 12.0–46.0)
Lymphs Abs: 1.2 10*3/uL (ref 0.7–4.0)
MCHC: 32.8 g/dL (ref 30.0–36.0)
MCV: 94 fl (ref 78.0–100.0)
Monocytes Absolute: 0.4 10*3/uL (ref 0.1–1.0)
Monocytes Relative: 11.7 % (ref 3.0–12.0)
Neutro Abs: 1.6 10*3/uL (ref 1.4–7.7)
Neutrophils Relative %: 49.7 % (ref 43.0–77.0)
Platelets: 168 10*3/uL (ref 150.0–400.0)
RBC: 4.05 Mil/uL — ABNORMAL LOW (ref 4.22–5.81)
RDW: 14.2 % (ref 11.5–15.5)
WBC: 3.2 10*3/uL — ABNORMAL LOW (ref 4.0–10.5)

## 2015-09-15 LAB — HEPATIC FUNCTION PANEL
ALT: 13 U/L (ref 0–53)
AST: 23 U/L (ref 0–37)
Albumin: 4.1 g/dL (ref 3.5–5.2)
Alkaline Phosphatase: 134 U/L — ABNORMAL HIGH (ref 39–117)
Bilirubin, Direct: 0.1 mg/dL (ref 0.0–0.3)
Total Bilirubin: 0.7 mg/dL (ref 0.2–1.2)
Total Protein: 7.3 g/dL (ref 6.0–8.3)

## 2015-09-15 LAB — TSH: TSH: 1.61 u[IU]/mL (ref 0.35–4.50)

## 2015-09-15 NOTE — Assessment & Plan Note (Addendum)
-   restarted trazadone 11/21/2014 > could not tolerate full rx > 04/28/2015  rec build up to full dose x 3 weeks > did not take> f/u neuro serially

## 2015-09-15 NOTE — Patient Instructions (Addendum)
Please remember to go to the lab and x-ray department downstairs for your tests - we will call you with the results when they are available.    Keep appt with neurology   Please schedule a follow up visit in 6 months but call sooner if needed

## 2015-09-15 NOTE — Progress Notes (Signed)
Subjective:     Patient ID: [name], male   DOB: [birth year]    MRN: [id number]    Brief patient profile:   49 yobm quit smoking 1956   diagnosed with pseudodementia in 2006 which complelely resolved on trazadone  November 21, 2008  wife confirms much better with memory    History of Present Illness  February 03, 2010 Followup. [name] poor appetite x 6 months. Also c/o feeling weak and fatigued. Sleeping all day, denies not sleeping well. rec Trazadone 50 mg one at bedtime ( take one half if too strong)   03/29/2011 f/u ov/Linas Stepter cc feeling poorly x sev months, indolent onset while off trazadone poor sleep and fatigue,  waking up with nocturia x 3, drinkng lots of tea. Poor appetite but no gi complaints or documented wt loss. Gets to sleep ok but can't stay asleeep.  Does ok with tending his garden/ yard in terms of activity tolerance. >>trazadone As needed     11/20/2014 f/u ov/Tyreque Finken re: fatigue  Chief Complaint  Patient presents with  . Follow-up    [name] weakness over the past several months.   Onset was insidious/ pattern is gradually progressive with poor appetite and becoming gradually less active though more sob than usual with adls Sleeping ok  Start trazadone 50 mg one half daily at bedtime x 3 weeks then one at bedtime   02/12/2015 Follow up : Memory Loss  Pt returns for 6 week follow up .  Wife has noticed his memory is getting bad over last year.  Labs done last ov with nml b12 , CXR w/ no acute process, no sign change in hbg at ~12.6.  He does not feel he has any memory issues. He does look to his wife throughout exam for answers.  Today MMSE 22 today .  rec refer to neuro> mild cognitive impairment    04/28/2015  f/u ov/Yuvin Bussiere re: ? dementia/ not using trazadone  Chief Complaint  Patient presents with  . Follow-up    pseudodementia. pt. states breathing is baseline. occ. prod. cough clear in color. denies wheezing, SOB, or chest pain/tightness   wife has not noted  any new neuro / memory issues but pt not using trazadone consistently " though it was a sleeping pill" and sleeping fine  rec Restart trazadone at part of a pill and over 3 weeks build up to a whole pill Prevnar 13 the last shot he'll ever need    09/15/2015  f/u ov/Alga Southall re: "insurance required a physical"   memory loss/ hbp/ Chief Complaint  Patient presents with  . Annual Exam    Pt is fasting. He states he is feeling well and denies any co's.       Not limited by breathing from desired activities  /or by claudication/ no obvious tias  No obvious day to day or daytime variability in MS  or assoc sob/ chronic cough or cp or chest tightness, subjective wheeze or overt sinus or hb symptoms. No unusual exp hx or h/o childhood pna/ asthma or knowledge of premature birth.  Sleeping ok without nocturnal  or early am exacerbation  of respiratory  c/o's or need for noct saba. Also denies any obvious fluctuation of symptoms with weather or environmental changes or other aggravating or alleviating factors except as outlined above   Current Medications, Allergies, Complete Past Medical History, Past Surgical History, Family History, and Social History were reviewed in [employer] record.  ROS  The following are not active complaints unless bolded sore throat, dysphagia, dental problems, itching, sneezing,  nasal congestion or excess/ purulent secretions, ear ache,   fever, chills, sweats, unintended wt loss, classically pleuritic or exertional cp, hemoptysis,  orthopnea pnd or leg swelling, presyncope, palpitations, abdominal pain, anorexia, nausea, vomiting, diarrhea  or change in bowel or bladder habits, change in stools or urine, dysuria,hematuria,  rash, arthralgias, visual complaints, headache, numbness, weakness or ataxia or problems with walking or coordination,  change in mood/affect or memory.            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 [place].......................................................................Marland KitchenWert  - Dt 12/26/2011  - Pneumovax 09/2000 (age 82).  Prevnar 13 04/28/2015   - CPX 03/17/2014  Hemorrhoids--Internal      Family History:  Ht dz father, 31's  Ca, mother  Kidney dz Brother  Has 5 brothers  No FH of Colon Cancer:    Social History:  Retired  Married  Childern  Patient is a former smoker. -quit in 1958           Objective:   Physical Exam  Ambulatory healthy appearing stoic bm in no acute distress   wt 170 05/22/08 > 154 November 20, 2008 >155 03/29/2011 >  12/26/2011  169 > 149 05/31/2012  > 153 09/12/2012 > 11/05/2012  146 > 03/17/2014  145 > 11/21/2014  142> 01/01/2015   143>139 02/12/2015 > 04/28/2015  140 > 09/15/2015   140   Vital signs reviewed   HEENT: partial dentures, turbinates, and orophanx. Nl external ear canals without cough reflex  Neck without JVD/Nodes/TM  Lungs clear to A and P bilaterally without cough on insp or exp maneuvers  RRR no s3 or murmur or increase in P2  Abd soft and benign with nl excursion in the supine position. No bruits or organomegaly  Ext warm without calf tenderness, cyanosis clubbing or edema  Skin warm and dry without lesions  neuro intact w/no focal deficits noted, nl gait  / knows Trump and [place] and democrats pursuing it  MS   nl gait GU uncircm/ no nodules/no IH Rectal:  Mod  bph/ smooth/rubbery/ no nodules/ stool g neg       CXR PA and Lateral:   09/15/2015 :    I personally reviewed images and agree with radiology impression as follows:   Hyperaeration. No active lung disease.  Labs ordered/ reviewed:      Chemistry      Component Value Date/Time   NA 143 09/15/2015 1359   K 4.0 09/15/2015 1359   CL 103 09/15/2015 1359   CO2 34* 09/15/2015 1359   BUN 15 09/15/2015 1359   CREATININE 1.19 09/15/2015 1359      Component Value Date/Time   CALCIUM 9.7 09/15/2015 1359   ALKPHOS 134* 09/15/2015 1359   AST 23 09/15/2015 1359   ALT 13 09/15/2015 1359   BILITOT 0.7 09/15/2015 1359        Lab Results  Component Value Date   WBC 3.2* 09/15/2015   HGB 12.5* 09/15/2015   HCT 38.0* 09/15/2015  MCV 94.0 09/15/2015   PLT 168.0 09/15/2015        Lab Results  Component Value Date   TSH 1.61 09/15/2015                   Assessment:

## 2015-09-16 NOTE — Assessment & Plan Note (Signed)
Adequate control on present rx, reviewed > no change in rx needed   

## 2015-09-16 NOTE — Assessment & Plan Note (Signed)
All vaccinations up to date, low fall risk/ f/u neuro planned

## 2015-09-16 NOTE — Assessment & Plan Note (Signed)
Lab Results  Component Value Date   CHOL 204* 09/15/2015   HDL 69.90 09/15/2015   LDLCALC 122* 09/15/2015   LDLDIRECT 106.9 12/26/2011   TRIG 63.0 09/15/2015   CHOLHDL 3 09/15/2015     Adequate control on present rx, reviewed > no change in rx needed  = diet

## 2015-09-16 NOTE — Assessment & Plan Note (Signed)
  Lab Results  Component Value Date   HGB 12.5* 09/15/2015   HGB 12.6* 01/01/2015   HGB 12.2* 11/20/2014     No evidence occult gib/ GI f/u prn at age 82

## 2015-09-16 NOTE — Progress Notes (Signed)
Quick Note:  Spoke with pt and notified of results per Dr. Wert. Pt verbalized understanding and denied any questions.  ______ 

## 2015-09-28 ENCOUNTER — Encounter: Payer: Self-pay | Admitting: Neurology

## 2015-09-28 ENCOUNTER — Ambulatory Visit (INDEPENDENT_AMBULATORY_CARE_PROVIDER_SITE_OTHER): Payer: Medicare Other | Admitting: Neurology

## 2015-09-28 VITALS — BP 110/72 | HR 55 | Ht 67.5 in | Wt 140.0 lb

## 2015-09-28 DIAGNOSIS — F03A0 Unspecified dementia, mild, without behavioral disturbance, psychotic disturbance, mood disturbance, and anxiety: Secondary | ICD-10-CM

## 2015-09-28 DIAGNOSIS — F0390 Unspecified dementia without behavioral disturbance: Secondary | ICD-10-CM | POA: Diagnosis not present

## 2015-09-28 MED ORDER — DONEPEZIL HCL 10 MG PO TABS: ORAL_TABLET | ORAL | Status: DC
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
## 2015-09-28 NOTE — Patient Instructions (Addendum)
1. Start daily aspirin 81mg   2. Start Aricept 10mg : Take 1/2 tablet daily for 1 month, then increase to 1 tablet daily. Please call our office for any problems, monitor for diarrhea and heart problems 3. Physical exercise and brain stimulation exercises are important for brain health 4. Follow-up in 6 months   Donepezil tablets What is this medicine? DONEPEZIL (doe NEP e zil) is used to treat mild to moderate dementia caused by Alzheimer's disease. This medicine may be used for other purposes; ask your health care provider or pharmacist if you have questions. What should I tell my health care provider before I take this medicine? They need to know if you have any of these conditions: -asthma or other lung disease -difficulty passing urine -head injury -heart disease -history of irregular heartbeat -liver disease -seizures (convulsions) -stomach or intestinal disease, ulcers or stomach bleeding -an unusual or allergic reaction to donepezil, other medicines, foods, dyes, or preservatives -pregnant or trying to get pregnant -breast-feeding How should I use this medicine? Take this medicine by mouth with a glass of water. Follow the directions on the prescription label. You may take this medicine with or without food. Take this medicine at regular intervals. This medicine is usually taken before bedtime. Do not take it more often than directed. Continue to take your medicine even if you feel better. Do not stop taking except on your doctor's advice. If you are taking the 23 mg donepezil tablet, swallow it whole; do not cut, crush, or chew it. Talk to your pediatrician regarding the use of this medicine in children. Special care may be needed. Overdosage: If you think you have taken too much of this medicine contact a poison control center or emergency room at once. NOTE: This medicine is only for you. Do not share this medicine with others. What if I miss a dose? If you miss a dose, take it  as soon as you can. If it is almost time for your next dose, take only that dose, do not take double or extra doses. What may interact with this medicine? Do not take this medicine with any of the following medications: -certain medicines for fungal infections like itraconazole, fluconazole, posaconazole, and voriconazole -cisapride -dextromethorphan; quinidine -dofetilide -dronedarone -pimozide -quinidine -thioridazine -ziprasidone This medicine may also interact with the following medications: -antihistamines for allergy, cough and cold -atropine -bethanechol -carbamazepine -certain medicines for bladder problems like oxybutynin, tolterodine -certain medicines for Parkinson's disease like benztropine, trihexyphenidyl -certain medicines for stomach problems like dicyclomine, hyoscyamine -certain medicines for travel sickness like scopolamine -dexamethasone -ipratropium -NSAIDs, medicines for pain and inflammation, like ibuprofen or naproxen -other medicines for Alzheimer's disease -other medicines that prolong the QT interval (cause an abnormal heart rhythm) -phenobarbital -phenytoin -rifampin, rifabutin or rifapentine This list may not describe all possible interactions. Give your health care provider a list of all the medicines, herbs, non-prescription drugs, or dietary supplements you use. Also tell them if you smoke, drink alcohol, or use illegal drugs. Some items may interact with your medicine. What should I watch for while using this medicine? Visit your doctor or health care professional for regular checks on your progress. Check with your doctor or health care professional if your symptoms do not get better or if they get worse. You may get drowsy or dizzy. Do not drive, use machinery, or do anything that needs mental alertness until you know how this drug affects you. What side effects may I notice from receiving this medicine? Side effects that  you should report to your  doctor or health care professional as soon as possible: -allergic reactions like skin rash, itching or hives, swelling of the face, lips, or tongue -changes in vision -feeling faint or lightheaded, falls -problems with balance -redness, blistering, peeling or loosening of the skin, including inside the mouth -slow heartbeat, or palpitations -stomach pain -unusual bleeding or bruising, red or purple spots on the skin -vomiting -weight loss Side effects that usually do not require medical attention (report to your doctor or health care professional if they continue or are bothersome): -diarrhea, especially when starting treatment -headache -indigestion or heartburn -loss of appetite -muscle cramps -nausea This list may not describe all possible side effects. Call your doctor for medical advice about side effects. You may report side effects to FDA at 1-800-FDA-1088. Where should I keep my medicine? Keep out of reach of children. Store at room temperature between 15 and 30 degrees C (59 and 86 degrees F). Throw away any unused medicine after the expiration date. NOTE: This sheet is a summary. It may not cover all possible information. If you have questions about this medicine, talk to your doctor, pharmacist, or health care provider.    2016, [employer]. (2014-01-08 07:51:52)

## 2015-09-29 ENCOUNTER — Encounter: Payer: Self-pay | Admitting: Neurology

## 2015-09-29 DIAGNOSIS — F0390 Unspecified dementia without behavioral disturbance: Secondary | ICD-10-CM | POA: Insufficient documentation

## 2015-09-29 HISTORY — DX: Unspecified dementia, unspecified severity, without behavioral disturbance, psychotic disturbance, mood disturbance, and anxiety: F03.90

## 2015-10-15 ENCOUNTER — Ambulatory Visit: Payer: Medicare Other | Admitting: Internal Medicine

## 2016-03-14 ENCOUNTER — Encounter: Payer: Self-pay | Admitting: Neurology

## 2016-03-14 ENCOUNTER — Ambulatory Visit (INDEPENDENT_AMBULATORY_CARE_PROVIDER_SITE_OTHER): Payer: Medicare Other | Admitting: Neurology

## 2016-03-14 VITALS — BP 116/72 | HR 66 | Temp 97.7°F | Ht 67.5 in | Wt 142.6 lb

## 2016-03-14 DIAGNOSIS — F0390 Unspecified dementia without behavioral disturbance: Secondary | ICD-10-CM | POA: Diagnosis not present

## 2016-03-14 DIAGNOSIS — F03A0 Unspecified dementia, mild, without behavioral disturbance, psychotic disturbance, mood disturbance, and anxiety: Secondary | ICD-10-CM

## 2016-03-14 NOTE — Patient Instructions (Signed)
1. Control of blood pressure, cholesterol, as well as physical exercise and brain stimulation exercises are important for brain health 2. Look into the [place] diet 3. Follow-up in 6 months, call for any changes
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
## 2016-03-16 ENCOUNTER — Encounter: Payer: Self-pay | Admitting: Internal Medicine

## 2016-03-16 ENCOUNTER — Ambulatory Visit (INDEPENDENT_AMBULATORY_CARE_PROVIDER_SITE_OTHER): Payer: Medicare Other | Admitting: Internal Medicine

## 2016-03-16 VITALS — BP 106/60 | HR 52 | Ht 67.5 in | Wt 143.0 lb

## 2016-03-16 DIAGNOSIS — Z23 Encounter for immunization: Secondary | ICD-10-CM

## 2016-03-16 DIAGNOSIS — R413 Other amnesia: Secondary | ICD-10-CM

## 2016-03-16 NOTE — Patient Instructions (Signed)
Baby aspirin daily   If memory gets any worse, definitely reconsider the aricept  For itchy rashes ok to use cortisone creams as needed (like cortaid)   Please schedule a follow up visit in 6 months but call sooner if needed

## 2016-03-16 NOTE — Progress Notes (Signed)
Subjective:     Patient ID: [name], male   DOB: 1925/10/28    MRN: [id number]    Brief patient profile:   62 yobm quit smoking 1958   diagnosed with pseudodementia in 2006 which complelely resolved on trazadone  November 21, 2008  wife confirmed much better with memory    History of Present Illness  February 03, 2010 Followup. [name] poor appetite x 6 months. Also c/o feeling weak and fatigued. Sleeping all day, denies not sleeping well. rec Trazadone 50 mg one at bedtime ( take one half if too strong)   03/29/2011 f/u ov/[name] cc feeling poorly x sev months, indolent onset while off trazadone poor sleep and fatigue,  waking up with nocturia x 3, drinkng lots of tea. Poor appetite but no gi complaints or documented wt loss. Gets to sleep ok but can't stay asleeep.  Does ok with tending his garden/ yard in terms of activity tolerance. >>trazadone As needed     11/20/2014 f/u ov/[name] re: fatigue  Chief Complaint  Patient presents with  . Follow-up    [name] weakness over the past several months.   Onset was insidious/ pattern is gradually progressive with poor appetite and becoming gradually less active though more sob than usual with adls Sleeping ok  Start trazadone 50 mg one half daily at bedtime x 3 weeks then one at bedtime   02/12/2015 Follow up : Memory Loss  Pt returns for 6 week follow up .  Wife has noticed his memory is getting bad over last year.  Labs done last ov with nml b12 , CXR w/ no acute process, no sign change in hbg at ~12.6.  He does not feel he has any memory issues. He does look to his wife throughout exam for answers.  Today MMSE 22 today .  rec refer to neuro> mild cognitive impairment    04/28/2015  f/u ov/[name] re: ? dementia/ not using trazadone  Chief Complaint  Patient presents with  . Follow-up    pseudodementia. pt. states breathing is baseline. occ. prod. cough clear in color. denies wheezing, SOB, or chest pain/tightness   wife has not noted  any new neuro / memory issues but pt not using trazadone consistently " though it was a sleeping pill" and sleeping fine  rec Restart trazadone at part of a pill and over 3 weeks build up to a whole pill Prevnar 13 the last shot he'll ever need    03/16/2016  f/u ov/[name] re: dementia Chief Complaint  Patient presents with  . Follow-up   does not remember name of neurologist or her recs = consider aricept  Concerned re itchy lesions abd and back (sk's)  No sob, tia, claudication  No obvious day to day or daytime variability in MS  or assoc sob/ chronic cough or cp or chest tightness, subjective wheeze or overt sinus or hb symptoms. No unusual exp hx or h/o childhood pna/ asthma or knowledge of premature birth.  Sleeping ok without nocturnal  or early am exacerbation  of respiratory  c/o's or need for noct saba. Also denies any obvious fluctuation of symptoms with weather or environmental changes or other aggravating or alleviating factors except as outlined above   Current Medications, Allergies, Complete Past Medical History, Past Surgical History, Family History, and Social History were reviewed in [facility] record.  ROS  The following are not active complaints unless bolded sore throat, dysphagia, dental problems, itching, sneezing,  nasal congestion or excess/ purulent secretions, ear ache,   fever, chills, sweats, unintended wt loss, classically pleuritic or exertional cp, hemoptysis,  orthopnea pnd or leg swelling, presyncope, palpitations, abdominal pain, anorexia, nausea, vomiting, diarrhea  or change in bowel or bladder habits, change in stools or urine, dysuria,hematuria,  rash, arthralgias, visual complaints, headache, numbness, weakness or ataxia or problems with walking or coordination,  change in mood/affect or memory.            Past Medical History:  PSEUDODEMENTIA (ICD-300.16)  - Onset 2006, resolved  On trazadone - restarted 11/21/2014  OBESITY,  MODERATE (ICD-278.00)  BUNDLE BRANCH BLOCK, RIGHT (ICD-426.4)  BENIGN PROSTATIC HYPERTROPHY, HX OF (ICD-V13.8)  VARICOCELE (ICD-456.4), left  COLONIC POLYPS (ICD-211.3)  - Colonoscopy 02/24/05  DIVERTICULOSIS OF COLON (ICD-562.10)  HEARING LOSS  - Audiology eval ordered November 20, 2008  GLAUCOMA (ICD-365.9)...................................................................Marland Kitchen  [name], HP  L Hip DJD................................................................................................ [name].......................................................................Marland KitchenWert  - Dt 12/26/2011  - Pneumovax 09/2000 (age 82).  Prevnar 13 04/28/2015   - CPX 03/17/2014  Hemorrhoids--Internal      Family History:  Ht dz father, 81's  Ca, mother  Kidney dz Brother  Has 5 brothers  No FH of Colon Cancer:    Social History:  Retired  Married  Childern  Patient is a former smoker. -quit in 1958           Objective:   Physical Exam  Ambulatory healthy appearing stoic bm in no acute distress   wt 170 05/22/08 > 154 November 20, 2008 >155 03/29/2011 >  12/26/2011  169 > 149 05/31/2012  > 153 09/12/2012 > 11/05/2012  146 > 03/17/2014  145 > 11/21/2014  142> 01/01/2015   143>139 02/12/2015 > 04/28/2015  140 > 09/15/2015   140 >   03/16/2016 143   Vital signs reviewed   HEENT: partial dentures, turbinates, and orophanx. Nl external ear canals without cough reflex  Neck without JVD/Nodes/TM  Lungs clear to A and P bilaterally without cough on insp or exp maneuvers  RRR no s3 or murmur or increase in P2  Abd soft and benign with nl excursion in the supine position. No bruits or organomegaly  Ext warm without calf tenderness, cyanosis clubbing or edema  Skin warm and dry without lesions  neuro intact w/no focal deficits noted, nl gait   MS   nl gait                        Assessment:

## 2016-03-17 NOTE — Assessment & Plan Note (Signed)
03/15/15 neurology eval > c/w mild cognitive impairment, plan serial eval before considering aricept   - CT head 03/19/2015 > Atrophy with extensive supratentorial small vessel disease. No intracranial mass, hemorrhage, or acute appearing infarct. - see neuro eval  03/14/16 :  MMSE down to 20/30 > rec consider aricept  He is clearly slipping now with true progressive cognitive decline rather than pseudodementia previously dx'd  I had an extended discussion with the patient and wife reviewing all relevant studies (including all the details from w/u by neuro in [facility]) completed to date and  lasting 15 to 20 minutes of a 25 minute visit  Discussed in detail all the  indications, usual  risks and alternatives  relative to the benefits with patient who agrees to proceed with conservative f/u on just baby asa daily (but wife will need to be sure he takes it as he can't remember to do so) and f/u with neuro as planned     Each maintenance medication was reviewed in detail including most importantly the difference between maintenance and prns and under what circumstances the prns are to be triggered using an action plan format that is not reflected in the computer generated alphabetically organized AVS.    Please see instructions for details which were reviewed in writing and the patient given a copy highlighting the part that I personally wrote and discussed at today's ov.

## 2016-03-20 ENCOUNTER — Encounter: Payer: Self-pay | Admitting: Neurology

## 2016-04-03 ENCOUNTER — Ambulatory Visit (INDEPENDENT_AMBULATORY_CARE_PROVIDER_SITE_OTHER): Payer: Medicare Other | Admitting: Internal Medicine

## 2016-04-03 ENCOUNTER — Encounter: Payer: Self-pay | Admitting: Internal Medicine

## 2016-04-03 ENCOUNTER — Ambulatory Visit (INDEPENDENT_AMBULATORY_CARE_PROVIDER_SITE_OTHER)
Admission: RE | Admit: 2016-04-03 | Discharge: 2016-04-03 | Disposition: A | Discharge status: Home / Self Care | Payer: Medicare Other | Source: Ambulatory Visit | Attending: Internal Medicine | Admitting: Internal Medicine

## 2016-04-03 VITALS — BP 122/70 | HR 60 | Temp 98.3°F

## 2016-04-03 DIAGNOSIS — R059 Cough, unspecified: Secondary | ICD-10-CM | POA: Insufficient documentation

## 2016-04-03 DIAGNOSIS — R918 Other nonspecific abnormal finding of lung field: Secondary | ICD-10-CM | POA: Diagnosis not present

## 2016-04-03 DIAGNOSIS — R05 Cough: Secondary | ICD-10-CM

## 2016-04-03 HISTORY — DX: Cough, unspecified: R05.9

## 2016-04-03 MED ORDER — FAMOTIDINE 20 MG PO TABS: ORAL_TABLET | ORAL | 11 refills | Status: DC

## 2016-04-03 NOTE — Patient Instructions (Addendum)
For cough / congestion > mucinex dm 1200 mg every 12 hours  Whenever coughing at night, add pepcid 20 mg ac 1 hour before bedtime - for daytime cough also add the pepcid after breakfast   Please remember to go to the  x-ray department downstairs for your tests - we will call you with the results when they are available.

## 2016-04-03 NOTE — Progress Notes (Signed)
Subjective:     Patient ID: [name], male   DOB: September 19, 1925    MRN: [id number]    Brief patient profile:   54 yobm quit smoking 1958   diagnosed with pseudodementia in 2006 which complelely resolved on trazadone  November 21, 2008  wife confirmed much better with memory    History of Present Illness  February 03, 2010 Followup. [name] poor appetite x 6 months. Also c/o feeling weak and fatigued. Sleeping all day, denies not sleeping well. rec Trazadone 50 mg one at bedtime ( take one half if too strong)   03/29/2011 f/u ov/Khrystian Schauf cc feeling poorly x sev months, indolent onset while off trazadone poor sleep and fatigue,  waking up with nocturia x 3, drinkng lots of tea. Poor appetite but no gi complaints or documented wt loss. Gets to sleep ok but can't stay asleeep.  Does ok with tending his garden/ yard in terms of activity tolerance. >>trazadone As needed     11/20/2014 f/u ov/Leda Bellefeuille re: fatigue  Chief Complaint  Patient presents with  . Follow-up    [name] weakness over the past several months.   Onset was insidious/ pattern is gradually progressive with poor appetite and becoming gradually less active though more sob than usual with adls Sleeping ok  Start trazadone 50 mg one half daily at bedtime x 3 weeks then one at bedtime   02/12/2015 Follow up : Memory Loss  Pt returns for 6 week follow up .  Wife has noticed his memory is getting bad over last year.  Labs done last ov with nml b12 , CXR w/ no acute process, no sign change in hbg at ~12.6.  He does not feel he has any memory issues. He does look to his wife throughout exam for answers.  Today MMSE 22 today .  rec refer to neuro> mild cognitive impairment    04/28/2015  f/u ov/Tyanne Derocher re: ? dementia/ not using trazadone  Chief Complaint  Patient presents with  . Follow-up    pseudodementia. pt. states breathing is baseline. occ. prod. cough clear in color. denies wheezing, SOB, or chest pain/tightness   wife has not noted  any new neuro / memory issues but pt not using trazadone consistently " though it was a sleeping pill" and sleeping fine  rec Restart trazadone at part of a pill and over 3 weeks build up to a whole pill Prevnar 13 the last shot he'll ever need    03/16/2016  f/u ov/Caledonia Zou re: dementia Chief Complaint  Patient presents with  . Follow-up   does not remember name of neurologist or her recs = consider aricept  Concerned re itchy lesions abd and back (sk's)  No sob, tia, claudication rec Baby aspirin daily  If memory gets any worse, definitely reconsider the aricept For itchy rashes ok to use cortisone creams as needed (like cortaid)     04/03/2016 acute extended ov/Keidra Withers re:   Sick x 10 days with coughing > white mucus worse at hs / no sob or cp     No obvious day to day or daytime variability in MS  or assoc sob/  chest tightness, subjective wheeze or overt sinus or hb symptoms. No unusual exp hx or h/o childhood pna/ asthma or knowledge of premature birth.  Sleeping ok without nocturnal  or early am exacerbation  of respiratory  c/o's or need for noct saba. Also denies any obvious fluctuation of symptoms with weather or environmental changes or  other aggravating or alleviating factors except as outlined above   Current Medications, Allergies, Complete Past Medical History, Past Surgical History, Family History, and Social History were reviewed in [employer] record.  ROS  The following are not active complaints unless bolded sore throat, dysphagia, dental problems, itching, sneezing,  nasal congestion or excess/ purulent secretions, ear ache,   fever, chills, sweats, unintended wt loss, classically pleuritic or exertional cp, hemoptysis,  orthopnea pnd or leg swelling, presyncope, palpitations, abdominal pain, anorexia, nausea, vomiting, diarrhea  or change in bowel or bladder habits, change in stools or urine, dysuria,hematuria,  rash, arthralgias, visual  complaints, headache, numbness, weakness or ataxia or problems with walking or coordination,  change in mood/affect or memory.            Past Medical History:  PSEUDODEMENTIA (ICD-300.16)  - Onset 2006, resolved  On trazadone - restarted 11/21/2014  OBESITY, MODERATE (ICD-278.00)  BUNDLE BRANCH BLOCK, RIGHT (ICD-426.4)  BENIGN PROSTATIC HYPERTROPHY, HX OF (ICD-V13.8)  VARICOCELE (ICD-456.4), left  COLONIC POLYPS (ICD-211.3)  - Colonoscopy 02/24/05  DIVERTICULOSIS OF COLON (ICD-562.10)  HEARING LOSS  - Audiology eval ordered November 20, 2008  GLAUCOMA (ICD-365.9)...................................................................Marland Kitchen  [name], HP  L Hip DJD................................................................................................ [place].......................................................................Marland KitchenWert  - Dt 12/26/2011  - Pneumovax 09/2000 (age 82).  Prevnar 13 80/16/2016   - CPX 03/17/2014  Hemorrhoids--Internal      Family History:  Ht dz father, 83's  Ca, mother  Kidney dz Brother  Has 5 brothers  No FH of Colon Cancer:    Social History:  Retired  Married  Childern  Patient is a former smoker. -quit in 1958           Objective:   Physical Exam  Ambulatory healthy appearing stoic bm in no acute distress   wt 170 05/22/08 > 154 November 20, 2008 >155 03/29/2011 >  12/26/2011  169 > 149 05/31/2012  > 153 09/12/2012 > 11/05/2012  146 > 03/17/2014  145 > 11/21/2014  142> 01/01/2015   143>139 02/12/2015 > 04/28/2015  140 > 09/15/2015   140 >   03/16/2016 143 >     Vital signs reviewed - Note on arrival 02 sats  99% on RA    HEENT: partial dentures, turbinates, and orophanx. Nl external ear canals without cough reflex  Neck without JVD/Nodes/TM  Lungs clear to A and P bilaterally without cough on insp or exp maneuvers  RRR no s3 or murmur or increase in P2  Abd soft and benign with nl excursion in the supine position. No bruits or  organomegaly  Ext warm without calf tenderness, cyanosis clubbing or edema  Skin warm and dry without lesions  neuro intact w/no focal deficits noted, nl gait   MS   nl gait            CXR PA and Lateral:   04/03/2016 :    I personally reviewed images and agree with radiology impression as follows:    Emphysema   An incidental finding of potential clinical significance has been found. 1 cm right lower lobe nodule. CT chest without contrast recommended to further evaluate  My impression: Not seen on lateral, doubt true nodule              Assessment:

## 2016-04-04 ENCOUNTER — Encounter: Payer: Self-pay | Admitting: Internal Medicine

## 2016-04-04 DIAGNOSIS — R918 Other nonspecific abnormal finding of lung field: Secondary | ICD-10-CM

## 2016-04-04 HISTORY — DX: Other nonspecific abnormal finding of lung field: R91.8

## 2016-04-04 NOTE — Assessment & Plan Note (Signed)
Likely uri. Of the three most common causes of chronic cough, only one (GERD)  can actually cause the other two (asthma and post nasal drip syndrome)  and perpetuate the cylce of cough inducing airway trauma, inflammation, heightened sensitivity to reflux which is prompted by the cough itself via a cyclical mechanism.    This may partially respond to steroids and look like asthma and post nasal drainage but never erradicated completely unless the cough and the secondary reflux are eliminated, preferably both at the same time.  While not intuitively obvious, many patients with chronic low grade reflux do not cough until there is a secondary insult that disturbs the protective epithelial barrier and exposes sensitive nerve endings.  This can be viral (as is the likely case here) or direct physical injury such as with an endotracheal tube.   The point is that once this occurs, it is difficult to eliminate using anything but a maximally effective acid suppression regimen at least in the short run, accompanied by an appropriate diet to address non acid GERD.   rec try pepcid ac 20 mg bid whenever coughing / prn mucinex dm

## 2016-04-04 NOTE — Assessment & Plan Note (Signed)
Vague marking only seen on pa not lateral view  Discussed in detail all the  indications, usual  risks and alternatives  relative to the benefits with patient who agrees to proceed with conservative f/u with cxr on return   I had an extended discussion with the patient reviewing all relevant studies completed to date and  lasting 15 to 20 minutes of a 25 minute visit    Each maintenance medication was reviewed in detail including most importantly the difference between maintenance and prns and under what circumstances the prns are to be triggered using an action plan format that is not reflected in the computer generated alphabetically organized AVS.    Please see instructions for details which were reviewed in writing and the patient given a copy highlighting the part that I personally wrote and discussed at today's ov.

## 2016-04-04 NOTE — Progress Notes (Signed)
Spoke with the pt's spouse and notified of results/recs and she verbalized understanding, appt scheduled for 07/06/16

## 2016-07-06 ENCOUNTER — Encounter: Payer: Self-pay | Admitting: Internal Medicine

## 2016-07-06 ENCOUNTER — Ambulatory Visit (INDEPENDENT_AMBULATORY_CARE_PROVIDER_SITE_OTHER): Payer: Medicare Other | Admitting: Internal Medicine

## 2016-07-06 ENCOUNTER — Ambulatory Visit (INDEPENDENT_AMBULATORY_CARE_PROVIDER_SITE_OTHER)
Admission: RE | Admit: 2016-07-06 | Discharge: 2016-07-06 | Disposition: A | Discharge status: Home / Self Care | Payer: Medicare Other | Source: Ambulatory Visit | Attending: Internal Medicine | Admitting: Internal Medicine

## 2016-07-06 VITALS — BP 124/82 | HR 55 | Ht 67.5 in | Wt 143.0 lb

## 2016-07-06 DIAGNOSIS — R413 Other amnesia: Secondary | ICD-10-CM

## 2016-07-06 DIAGNOSIS — R05 Cough: Secondary | ICD-10-CM

## 2016-07-06 DIAGNOSIS — R059 Cough, unspecified: Secondary | ICD-10-CM

## 2016-07-06 NOTE — Progress Notes (Signed)
Subjective:     Patient ID: [name], male   DOB: 04/05/1926    MRN: [id number]    Brief patient profile:   36  yobm quit smoking 1958   diagnosed with pseudodementia in 2006 which complelely resolved on trazadone    November 21, 2008  wife confirmed much better with memory    History of Present Illness  February 03, 2010 Followup. [name] poor appetite x 6 months. Also c/o feeling weak and fatigued. Sleeping all day, denies not sleeping well. rec Trazadone 50 mg one at bedtime ( take one half if too strong)   03/29/2011 f/u ov/Wert cc feeling poorly x sev months, indolent onset while off trazadone poor sleep and fatigue,  waking up with nocturia x 3, drinkng lots of tea. Poor appetite but no gi complaints or documented wt loss. Gets to sleep ok but can't stay asleeep.  Does ok with tending his garden/ yard in terms of activity tolerance. >>trazadone As needed     11/20/2014 f/u ov/Wert re: fatigue  Chief Complaint  Patient presents with  . Follow-up    [name] weakness over the past several months.   Onset was insidious/ pattern is gradually progressive with poor appetite and becoming gradually less active though more sob than usual with adls Sleeping ok  Start trazadone 50 mg one half daily at bedtime x 3 weeks then one at bedtime   02/12/2015 Follow up : Memory Loss  Pt returns for 6 week follow up .  Wife has noticed his memory is getting bad over last year.  Labs done last ov with nml b12 , CXR w/ no acute process, no sign change in hbg at ~12.6.  He does not feel he has any memory issues. He does look to his wife throughout exam for answers.  Today MMSE 22 today .  rec refer to neuro> mild cognitive impairment    04/28/2015  f/u ov/Wert re: ? dementia/ not using trazadone  Chief Complaint  Patient presents with  . Follow-up    pseudodementia. pt. states breathing is baseline. occ. prod. cough clear in color. denies wheezing, SOB, or chest pain/tightness   wife has not  noted any new neuro / memory issues but pt not using trazadone consistently " though it was a sleeping pill" and sleeping fine  rec Restart trazadone at part of a pill and over 3 weeks build up to a whole pill Prevnar 13 the last shot he'll ever need    03/16/2016  f/u ov/Wert re: dementia Chief Complaint  Patient presents with  . Follow-up   does not remember name of neurologist or her recs = consider aricept ([place]) Concerned re itchy lesions abd and back (sk's)  No sob, tia, claudication rec Baby aspirin daily  If memory gets any worse, definitely reconsider the aricept For itchy rashes ok to use cortisone creams as needed (like cortaid)     04/03/2016 acute extended ov/Wert re:  Sick x 10 days with coughing > white mucus worse at hs / no sob or cp  rec For cough / congestion > mucinex dm 1200 mg every 12 hours Whenever coughing at night, add pepcid 20 mg ac 1 hour before bedtime - for daytime cough also add the pepcid after breakfast  Please remember to go to the  x-ray department downstairs for your tests - we will call you with the results when they are available.     07/06/2016  f/u ov/Wert re:  Dementia /  f/u nodule / asa 81 mg per day only maint rx Chief Complaint  Patient presents with  . Follow-up    Cough has resolved.  no longer on any pepcid/ Not limited by breathing from desired activities   Memory is better per wife / no tia or claudication hx   No obvious day to day or daytime variability in memory  or assoc sob/  chest tightness, subjective wheeze or overt sinus or hb symptoms. No unusual exp hx or h/o childhood pna/ asthma or knowledge of premature birth.  Sleeping ok without nocturnal  or early am exacerbation  of respiratory  c/o's or need for noct saba. Also denies any obvious fluctuation of symptoms with weather or environmental changes or other aggravating or alleviating factors except as outlined above   Current Medications, Allergies, Complete Past  Medical History, Past Surgical History, Family History, and Social History were reviewed in [facility] record.  ROS  The following are not active complaints unless bolded sore throat, dysphagia, dental problems, itching, sneezing,  nasal congestion or excess/ purulent secretions, ear ache,   fever, chills, sweats, unintended wt loss, classically pleuritic or exertional cp, hemoptysis,  orthopnea pnd or leg swelling, presyncope, palpitations, abdominal pain, anorexia, nausea, vomiting, diarrhea  or change in bowel or bladder habits, change in stools or urine, dysuria,hematuria,  rash, arthralgias, visual complaints, headache, numbness, weakness or ataxia or problems with walking or coordination,  change in mood/affect or memory some better             Past Medical History:  PSEUDODEMENTIA (ICD-300.16)  - Onset 2006, resolved  On trazadone - restarted 11/21/2014  OBESITY, MODERATE (ICD-278.00)  BUNDLE BRANCH BLOCK, RIGHT (ICD-426.4)  BENIGN PROSTATIC HYPERTROPHY, HX OF (ICD-V13.8)  VARICOCELE (ICD-456.4), left  COLONIC POLYPS (ICD-211.3)  - Colonoscopy 02/24/05  DIVERTICULOSIS OF COLON (ICD-562.10)  HEARING LOSS  - Audiology eval ordered November 20, 2008  GLAUCOMA (ICD-365.9)...................................................................Marland Kitchen  [name], HP  L Hip DJD................................................................................................ [place].......................................................................Marland KitchenWert  - Dt 12/26/2011  - Pneumovax 09/2000 (age 9).  Prevnar 13 04/28/2015   - CPX 03/17/2014  Hemorrhoids--Internal      Family History:  Ht dz father, 24's  Ca, mother  Kidney dz Brother  Has 5 brothers  No FH of Colon Cancer:    Social History:  Retired  Married  Childern  Patient is a former smoker. -quit in 1958           Objective:   Physical Exam  Ambulatory healthy appearing stoic bm in no  acute distress / good sense of humor today  wt 170 05/22/08 > 154 November 20, 2008 >155 03/29/2011 >  12/26/2011  169 > 149 05/31/2012  > 153 09/12/2012 > 11/05/2012  146 > 03/17/2014  145 > 11/21/2014  142> 01/01/2015   143>139 02/12/2015 > 04/28/2015  140 > 09/15/2015   140 >   03/16/2016 143 >  07/06/2016    Vital signs reviewed - Note on arrival 02 sats  99% on RA  And bp 124/82   HEENT: partial dentures, turbinates, and oropharynx. Nl external ear canals without cough reflex  Neck without JVD/Nodes/TM  Lungs clear to A and P bilaterally without cough on insp or exp maneuvers  RRR no s3 or murmur or increase in P2  Abd soft and benign with nl excursion in the supine position. No bruits or organomegaly  Ext warm without calf tenderness, cyanosis clubbing or edema  Skin warm and dry without lesions  neuro intact  w/no focal deficits noted, nl gait - knows current events  MS   nl gait, no obvious restrictions          CXR PA and Lateral:   07/06/2016 :    I personally reviewed images and agree with radiology impression as follows:    COPD. There is no pneumonia nor other acute cardiopulmonary abnormality.            Assessment:

## 2016-07-06 NOTE — Progress Notes (Signed)
ATC, NA and no option to leave msg, WCB

## 2016-07-06 NOTE — Patient Instructions (Addendum)
Please remember to go to the   x-ray department downstairs for your tests - we will call you with the results when they are available.  Please schedule a follow up office visit in 6 months, call sooner if needed      

## 2016-07-06 NOTE — Assessment & Plan Note (Signed)
03/15/15 neurology eval > c/w mild cognitive impairment, plan serial eval before considering aricept   - CT head 03/19/2015 > Atrophy with extensive supratentorial small vessel disease. No intracranial mass, hemorrhage, or acute appearing infarct. - see neuro eval  03/14/16 :  MMSE down to 20/30 > rec consider aricept per [name]  Improved off aricept per wife which makes me inclined to think this is more of a pseudodementia pattern as was the case previously > fu neuro due in April 2017 and can start aricept in meantime if there is def progression

## 2016-07-06 NOTE — Assessment & Plan Note (Signed)
Resolved with conservative rx, reminded in future if start coughing > pepcid 20 mg bid and prn mucinex dm

## 2016-07-11 NOTE — Progress Notes (Signed)
Spoke with pt and notified of results per Dr. Wert. Pt verbalized understanding and denied any questions. 

## 2016-09-14 ENCOUNTER — Ambulatory Visit: Payer: Medicare Other | Admitting: Internal Medicine

## 2016-09-19 ENCOUNTER — Encounter: Payer: Self-pay | Admitting: Neurology

## 2016-09-19 ENCOUNTER — Ambulatory Visit (INDEPENDENT_AMBULATORY_CARE_PROVIDER_SITE_OTHER): Payer: Medicare Other | Admitting: Neurology

## 2016-09-19 VITALS — BP 128/84 | HR 67 | Temp 97.9°F | Resp 16 | Ht 65.5 in | Wt 141.0 lb

## 2016-09-19 DIAGNOSIS — F0390 Unspecified dementia without behavioral disturbance: Secondary | ICD-10-CM | POA: Diagnosis not present

## 2016-09-19 DIAGNOSIS — F03A0 Unspecified dementia, mild, without behavioral disturbance, psychotic disturbance, mood disturbance, and anxiety: Secondary | ICD-10-CM

## 2016-09-19 MED ORDER — DONEPEZIL HCL 10 MG PO TABS: ORAL_TABLET | ORAL | 3 refills | Status: DC

## 2016-09-19 NOTE — Patient Instructions (Signed)
1. Start Aricept 10mg : Take 1/2 tablet daily for 1 month, then increase to 1 tablet daily 2. Control of blood pressure, cholesterol, as well as physical exercise and brain stimulation exercises are important for brain health 3. Follow-up in 6 months, call for any changes
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
## 2017-01-04 ENCOUNTER — Other Ambulatory Visit (INDEPENDENT_AMBULATORY_CARE_PROVIDER_SITE_OTHER): Payer: Medicare Other

## 2017-01-04 ENCOUNTER — Encounter: Payer: Self-pay | Admitting: Internal Medicine

## 2017-01-04 ENCOUNTER — Ambulatory Visit (INDEPENDENT_AMBULATORY_CARE_PROVIDER_SITE_OTHER): Payer: Medicare Other | Admitting: Internal Medicine

## 2017-01-04 ENCOUNTER — Telehealth: Payer: Self-pay | Admitting: Internal Medicine

## 2017-01-04 VITALS — BP 104/64 | HR 56 | Ht 65.5 in | Wt 144.0 lb

## 2017-01-04 DIAGNOSIS — R413 Other amnesia: Secondary | ICD-10-CM

## 2017-01-04 DIAGNOSIS — R109 Unspecified abdominal pain: Secondary | ICD-10-CM

## 2017-01-04 HISTORY — DX: Unspecified abdominal pain: R10.9

## 2017-01-04 LAB — URINALYSIS
Bilirubin Urine: NEGATIVE
Hgb urine dipstick: NEGATIVE
Ketones, ur: NEGATIVE
Leukocytes, UA: NEGATIVE
Nitrite: NEGATIVE
Specific Gravity, Urine: 1.02 (ref 1.000–1.030)
Total Protein, Urine: NEGATIVE
Urine Glucose: NEGATIVE
Urobilinogen, UA: 0.2 (ref 0.0–1.0)
pH: 6 (ref 5.0–8.0)

## 2017-01-04 NOTE — Assessment & Plan Note (Signed)
U/a neg > try anaprox as per otc instructions but take with meals prn and f/u if not better with ov  I had an extended discussion with the patient reviewing all relevant studies completed to date and  lasting 15 to 20 minutes of a 25 minute visit    Each maintenance medication was reviewed in detail including most importantly the difference between maintenance and prns and under what circumstances the prns are to be triggered using an action plan format that is not reflected in the computer generated alphabetically organized AVS.    Please see AVS for specific instructions unique to this visit that I personally wrote and verbalized to the the pt in detail and then reviewed with pt  by my nurse highlighting any  changes in therapy recommended at today's visit to their plan of care.

## 2017-01-04 NOTE — Progress Notes (Signed)
Subjective:     Patient ID: [name], male   DOB: 04/05/1926    MRN: [id number]    Brief patient profile:   36  yobm quit smoking 1958   diagnosed with pseudodementia in 2006 which complelely resolved on trazadone    November 21, 2008  wife confirmed much better with memory    History of Present Illness  February 03, 2010 Followup. [name] poor appetite x 6 months. Also c/o feeling weak and fatigued. Sleeping all day, denies not sleeping well. rec Trazadone 50 mg one at bedtime ( take one half if too strong)   03/29/2011 f/u ov/[name] cc feeling poorly x sev months, indolent onset while off trazadone poor sleep and fatigue,  waking up with nocturia x 3, drinkng lots of tea. Poor appetite but no gi complaints or documented wt loss. Gets to sleep ok but can't stay asleeep.  Does ok with tending his garden/ yard in terms of activity tolerance. >>trazadone As needed     11/20/2014 f/u ov/[name] re: fatigue  Chief Complaint  Patient presents with  . Follow-up    [name] weakness over the past several months.   Onset was insidious/ pattern is gradually progressive with poor appetite and becoming gradually less active though more sob than usual with adls Sleeping ok  Start trazadone 50 mg one half daily at bedtime x 3 weeks then one at bedtime   02/12/2015 Follow up : Memory Loss  Pt returns for 6 week follow up .  Wife has noticed his memory is getting bad over last year.  Labs done last ov with nml b12 , CXR w/ no acute process, no sign change in hbg at ~12.6.  He does not feel he has any memory issues. He does look to his wife throughout exam for answers.  Today MMSE 22 today .  rec refer to neuro> mild cognitive impairment    04/28/2015  f/u ov/[name] re: ? dementia/ not using trazadone  Chief Complaint  Patient presents with  . Follow-up    pseudodementia. pt. states breathing is baseline. occ. prod. cough clear in color. denies wheezing, SOB, or chest pain/tightness   wife has not  noted any new neuro / memory issues but pt not using trazadone consistently " though it was a sleeping pill" and sleeping fine  rec Restart trazadone at part of a pill and over 3 weeks build up to a whole pill Prevnar 13 the last shot he'll ever need    03/16/2016  f/u ov/[name] re: dementia Chief Complaint  Patient presents with  . Follow-up   does not remember name of neurologist or her recs = consider aricept (Acquino) Concerned re itchy lesions abd and back (sk's)  No sob, tia, claudication rec Baby aspirin daily  If memory gets any worse, definitely reconsider the aricept For itchy rashes ok to use cortisone creams as needed (like cortaid)     04/03/2016 acute extended ov/[name] re:  Sick x 10 days with coughing > white mucus worse at hs / no sob or cp  rec For cough / congestion > mucinex dm 1200 mg every 12 hours Whenever coughing at night, add pepcid 20 mg ac 1 hour before bedtime - for daytime cough also add the pepcid after breakfast  Please remember to go to the  x-ray department downstairs for your tests - we will call you with the results when they are available.     07/06/2016  f/u ov/[name] re:  Dementia /  f/u nodule / asa 81 mg per day only maint rx Chief Complaint  Patient presents with  . Follow-up    Cough has resolved.  no longer on any pepcid/ Not limited by breathing from desired activities   Memory is better per wife / no tia or claudication hx rec Please remember to go to the  x-ray department downstairs for your tests - we will call you with the results when they are available  > no nodule      01/04/2017  f/u ov/[name] re:  Dementia / Asa 81 mg / tried aricept but no better so stopped and has not been back to neuro Chief Complaint  Patient presents with  . Follow-up    [name] right side pain x 4-5 days- worse with movement and comes and goes.   positional R Cp, not pleuritic  Has not tried anaprox / develped p worked in yard No assoc n or v or dysuria/  hematuria  No obvious day to day or daytime variability or assoc excess/ purulent sputum or mucus plugs or hemoptysis or   chest tightness, subjective wheeze or overt sinus or hb symptoms. No unusual exp hx or h/o childhood pna/ asthma or knowledge of premature birth.  Sleeping ok without nocturnal  or early am exacerbation  of respiratory  c/o's or need for noct saba. Also denies any obvious fluctuation of symptoms with weather or environmental changes or other aggravating or alleviating factors except as outlined above   Current Medications, Allergies, Complete Past Medical History, Past Surgical History, Family History, and Social History were reviewed in [employer] record.  ROS  The following are not active complaints unless bolded sore throat, dysphagia, dental problems, itching, sneezing,  nasal congestion or excess/ purulent secretions, ear ache,   fever, chills, sweats, unintended wt loss, classically pleuritic or exertional cp,  orthopnea pnd or leg swelling, presyncope, palpitations, abdominal pain, anorexia, nausea, vomiting, diarrhea  or change in bowel or bladder habits, change in stools or urine, dysuria,hematuria,  rash, arthralgias, visual complaints, headache, numbness, weakness or ataxia or problems with walking or coordination,  change in mood/affect or memory.                      Past Medical History:  PSEUDODEMENTIA (ICD-300.16)  - Onset 2006, resolved  On trazadone   OBESITY, MODERATE (ICD-278.00)  BUNDLE BRANCH BLOCK, RIGHT (ICD-426.4)  BENIGN PROSTATIC HYPERTROPHY, HX OF (ICD-V13.8)  VARICOCELE (ICD-456.4), left  COLONIC POLYPS (ICD-211.3)  - Colonoscopy 02/24/05  DIVERTICULOSIS OF COLON (ICD-562.10)  HEARING LOSS  - Audiology eval ordered November 20, 2008  GLAUCOMA (ICD-365.9)...................................................................Marland Kitchen  [name], HP  L Hip  DJD................................................................................................ [place].......................................................................Marland KitchenWert  - Dt 12/26/2011  - Pneumovax 09/2000 (age 32).  Prevnar 13 04/28/2015   - CPX 03/17/2014  Hemorrhoids--Internal      Family History:  Ht dz father, 48's  Ca, mother  Kidney dz Brother  Has 5 brothers  No FH of Colon Cancer:    Social History:  Retired  Married  Childern  Patient is a former smoker. -quit in 1958           Objective:   Physical Exam  Ambulatory healthy appearing stoic bm in no acute distress   wt 170 05/22/08 > 154 November 20, 2008 >155 03/29/2011 >  12/26/2011  169 > 149 05/31/2012  > 153 09/12/2012 > 11/05/2012  146 > 03/17/2014  145 > 11/21/2014  142> 01/01/2015   143>139  02/12/2015 > 04/28/2015  140 > 09/15/2015   140 >   03/16/2016 143 >   01/04/2017  144     Vital signs reviewed - Note on arrival 02 sats  100%    HEENT: partial dentures, turbinates, and oropharynx. Nl external ear canals without cough reflex  Neck without JVD/Nodes/TM  Lungs clear to A and P bilaterally without cough on insp or exp maneuvers  RRR no s3 or murmur or increase in P2  Abd soft and benign with nl excursion in the supine position. No bruits or organomegaly  Ext warm without calf tenderness, cyanosis clubbing or edema  Skin warm and dry without lesions  neuro intact w/no focal deficits noted, nl gait - knows current events  MS   nl gait, no obvious restrictions Neuro:  Knows day of week, president of [place] and [place] just met           CXR PA and Lateral:   07/06/2016 :    I personally reviewed images and agree with radiology impression as follows:    COPD. There is no pneumonia nor other acute cardiopulmonary abnormality.            Assessment:    Outpatient Encounter Prescriptions as of 01/04/2017  Medication Sig  . aspirin EC 81 MG tablet Take 81 mg by mouth daily.  .  brimonidine (ALPHAGAN) 0.2 % ophthalmic solution Place 1 drop into the left eye Twice daily.   . clotrimazole (LOTRIMIN) 1 % cream Apply 1 application topically 2 (two) times daily as needed.  . famotidine (PEPCID) 20 MG tablet One at bedtime (Patient taking differently: One at bedtime as needed)  . Multiple Vitamin (MULTIVITAMIN) capsule Take 1 capsule by mouth daily.  . naproxen sodium (ALEVE) 220 MG tablet Take 220 mg by mouth as needed.  . timolol (TIMOPTIC) 0.5 % ophthalmic solution Place 1 drop into the left eye daily.   . [DISCONTINUED] donepezil (ARICEPT) 10 MG tablet Take 1 tablet daily (Patient not taking: Reported on 01/04/2017)   No facility-administered encounter medications on file as of 01/04/2017.

## 2017-01-04 NOTE — Patient Instructions (Addendum)
No change in medications   If the memory issue gets worse  You will need to see your  neurologist and let him know the medication = Aricept didn't help and when you stopped it   Please see patient coordinator before you leave today  to schedule an appointment with a geriactric specialist   Please remember to go to the lab department downstairs in the basement  for your tests - we will call you with the results when they are available.      Follow up here will be as needed once you establish with a geriatric specialist

## 2017-01-04 NOTE — Progress Notes (Signed)
Spoke with pt's spouse and notified of results per Dr. Wert. She verbalized understanding and denied any questions. 

## 2017-01-04 NOTE — Assessment & Plan Note (Signed)
03/15/15 neurology eval > c/w mild cognitive impairment, plan serial eval before considering aricept   - CT head 03/19/2015 > Atrophy with extensive supratentorial small vessel disease. No intracranial mass, hemorrhage, or acute appearing infarct. - see neuro eval  03/14/16 :  MMSE down to 20/30 > rec consider aricept per Aquino> tried and failed per wife at of 01/04/2017 and had not reported this to [facility] as of date of ov   Explained to pt/ wife we can't change the natural hx, only improve the symptoms, and if they are worsening then return to neuro  In meantime refer to geriactrics to establish

## 2017-01-04 NOTE — Telephone Encounter (Signed)
Pt came back in and decided that they are ok with seeing whoever Dr Melvyn Novas has suggested after talking with the nurse

## 2017-01-16 ENCOUNTER — Ambulatory Visit: Payer: Self-pay | Admitting: Internal Medicine

## 2017-01-30 ENCOUNTER — Ambulatory Visit (INDEPENDENT_AMBULATORY_CARE_PROVIDER_SITE_OTHER): Payer: Medicare Other | Admitting: Internal Medicine

## 2017-01-30 ENCOUNTER — Encounter: Payer: Self-pay | Admitting: Internal Medicine

## 2017-01-30 VITALS — BP 100/52 | HR 56 | Temp 97.7°F | Resp 18 | Ht 66.0 in | Wt 143.2 lb

## 2017-01-30 DIAGNOSIS — H401190 Primary open-angle glaucoma, unspecified eye, stage unspecified: Secondary | ICD-10-CM

## 2017-01-30 DIAGNOSIS — H547 Unspecified visual loss: Secondary | ICD-10-CM

## 2017-01-30 DIAGNOSIS — E538 Deficiency of other specified B group vitamins: Secondary | ICD-10-CM | POA: Diagnosis not present

## 2017-01-30 DIAGNOSIS — T63441A Toxic effect of venom of bees, accidental (unintentional), initial encounter: Secondary | ICD-10-CM | POA: Diagnosis not present

## 2017-01-30 DIAGNOSIS — R5383 Other fatigue: Secondary | ICD-10-CM

## 2017-01-30 DIAGNOSIS — J302 Other seasonal allergic rhinitis: Secondary | ICD-10-CM | POA: Insufficient documentation

## 2017-01-30 DIAGNOSIS — R413 Other amnesia: Secondary | ICD-10-CM

## 2017-01-30 HISTORY — DX: Other seasonal allergic rhinitis: J30.2

## 2017-01-30 HISTORY — DX: Unspecified visual loss: H54.7

## 2017-01-30 LAB — CBC WITH DIFFERENTIAL/PLATELET
Basophils Absolute: 42 cells/uL (ref 0–200)
Basophils Relative: 1 %
Eosinophils Absolute: 126 cells/uL (ref 15–500)
Eosinophils Relative: 3 %
HCT: 37.2 % — ABNORMAL LOW (ref 38.5–50.0)
Hemoglobin: 12.2 g/dL — ABNORMAL LOW (ref 13.2–17.1)
Lymphocytes Relative: 30 %
Lymphs Abs: 1260 cells/uL (ref 850–3900)
MCH: 30.3 pg (ref 27.0–33.0)
MCHC: 32.8 g/dL (ref 32.0–36.0)
MCV: 92.3 fL (ref 80.0–100.0)
MPV: 10.1 fL (ref 7.5–12.5)
Monocytes Absolute: 378 cells/uL (ref 200–950)
Monocytes Relative: 9 %
Neutro Abs: 2394 cells/uL (ref 1500–7800)
Neutrophils Relative %: 57 %
Platelets: 190 10*3/uL (ref 140–400)
RBC: 4.03 MIL/uL — ABNORMAL LOW (ref 4.20–5.80)
RDW: 13.5 % (ref 11.0–15.0)
WBC: 4.2 10*3/uL (ref 3.8–10.8)

## 2017-01-30 LAB — TSH: TSH: 1.9 mIU/L (ref 0.40–4.50)

## 2017-01-30 MED ORDER — METHYLPREDNISOLONE 4 MG PO TBPK: ORAL_TABLET | ORAL | 0 refills | Status: DC

## 2017-01-30 MED ORDER — MONTELUKAST SODIUM 5 MG PO CHEW: 5.0000 mg | CHEWABLE_TABLET | Freq: Every day | ORAL | 2 refills | Status: DC

## 2017-01-30 MED ORDER — METHYLPREDNISOLONE ACETATE 40 MG/ML IJ SUSP
40.0000 mg | Freq: Once | INTRAMUSCULAR | Status: AC
  Administered 2017-01-30: 40 mg via INTRAMUSCULAR

## 2017-01-30 NOTE — Patient Instructions (Addendum)
START ASPIRIN 81MG  daily  START SINGULAIR 5MG  at bedtime for seasonal allergy  DEPO-MEDROL injection given today  Continue other medications as ordered  Recommend Shingrix injection for shingles. Will need pneumovax next visit  Follow up with Dr Delice Lesch as scheduled.  Follow up in 1 month for AWV/CPE.

## 2017-01-30 NOTE — Progress Notes (Signed)
Patient ID: [name], male   DOB: 05-Apr-1926, 83 y.o.   MRN: [id number]    Location:  [facility] Place of Service: OFFICE    Advanced Directive information Does Patient Have a Medical Advance Directive?: Yes, Type of Advance Directive: [place], Does patient want to make changes to medical advance directive?: No - Patient declined  Chief Complaint  Patient presents with  . Establish Care    HPI:  83 yo male seen today as a new pt. He was stung by several yellow jackets last week. Applying topical agents which helps but still swollen. Daughters, [name] and [name], and spouse present today. They call him "[name]". He also reports rhinorrhea, post nasal drip, constant clearing throat. No HA/dizziness. He has a dry cough. No other concerns. He is a poor historian due to memory loss. Hx obtained from family and chart  Glaucoma/cats - he has tunnel vision in OD and markedly reduced vision OS. He has OD cats with IOL in past. He uses glaucoma gtts in OS (OD has bleb). Followed by ophthamology Dr Tora Kindred  Mild cognitive impairment - followed by Dr Delice Lesch. Tried aricept but it caused more sedation/wekaness. Stopped med and sx's improved. He remains active at home and community. 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 Appetite is excellent and he has gained a few lbs over the last several mos. MMSE 23/30.  B12 deficiency - has rec'd B12 injections in the past. Last B12 level 340 in 2016  HTN - diet controlled. BP 100/52.  Chart reviewed. He will be celebrating 56 yrs of marriage soon  Past Medical History:  Diagnosis Date  . BPH (benign prostatic hypertrophy)   . Cataract    Both  . Diverticula of colon   . Glaucoma 1986   Dr. Mila Merry  . Hearing loss   . Hx of adenomatous colonic polyps   . Internal hemorrhoids   . Left varicocele   . Obesity   . Pseudodementia   . RBBB (right bundle branch block)     Past Surgical History:    Procedure Laterality Date  . CATARACT EXTRACTION Right   . GLAUCOMA REPAIR Left     Patient Care Team: Gildardo Cranker, DO as PCP - General (Internal Medicine) Tora Kindred Marily Lente, MD as Consulting Physician (Ophthalmology) [name], MD as Consulting Physician (Neurology) [name], MD as Consulting Physician (Pulmonary Disease)  Social History   Social History  . Marital status: Married    Spouse name: N/A  . Number of children: N/A  . Years of education: N/A   Occupational History  . Not on file.   Social History Main Topics  . Smoking status: Former Smoker    Packs/day: 0.30    Years: 10.00    Types: Cigarettes    Quit date: 06/12/1956  . Smokeless tobacco: Never Used  . Alcohol use No  . Drug use: No  . Sexual activity: No   Other Topics Concern  . Not on file   Social History Narrative   Social History      Diet?       Do you drink/eat things with caffeine? Coffee, tea      Marital status?           married                         What year were you married? 54  Do you live in a house, apartment, assisted living, condo, trailer, etc.? house      Is it one or more stories? one      How many persons live in your home? 2      Do you have any pets in your home? (please list) no      Highest level of education completed? High school      Current or past profession: Camera operator      Do you exercise?           yes                           Type & how often? Walking everyday weather permitting      Do you have a living will? yes      Do you have a DNR form?      no                            If not, do you want to discuss one?      Do you have signed POA/HPOA for forms? yes      Functional Status      Do you have difficulty bathing or dressing yourself? no      Do you have difficulty preparing food or eating? no      Do you have difficulty managing your medications? no      Do you have difficulty  managing your finances? yes      Do you have difficulty affording your medications? no        reports that he quit smoking about 60 years ago. His smoking use included Cigarettes. He has a 3.00 pack-year smoking history. He has never used smokeless tobacco. He reports that he does not drink alcohol or use drugs.  Family History  Problem Relation Age of Onset  . Heart disease Father 16  . Parkinson's disease Father   . Cancer Mother        ? type  . Colon cancer Neg Hx    Family Status  Relation Status  . Father Deceased  . Mother Deceased  . Brother Alive  . Brother Alive  . Brother Alive  . Brother Alive  . Daughter Alive  . Daughter Alive  . Daughter Alive  . Son Alive  . Neg Hx (Not Specified)    Immunization History  Administered Date(s) Administered  . Influenza Split 03/29/2011, 03/13/2012  . Influenza Whole 03/11/2008, 03/26/2009, 02/24/2010  . Influenza, High Dose Seasonal PF 03/16/2016  . Influenza,inj,Quad PF,6+ Mos 04/02/2013, 03/17/2014, 02/12/2015  . Pneumococcal Conjugate-13 04/28/2015  . Tdap 12/26/2011    No Known Allergies  Medications: Patient's Medications  New Prescriptions   METHYLPREDNISOLONE (MEDROL DOSEPAK) 4 MG TBPK TABLET    Take as directed. START 01/31/17   MONTELUKAST (SINGULAIR) 5 MG CHEWABLE TABLET    Chew 1 tablet (5 mg total) by mouth at bedtime.  Previous Medications   ASPIRIN EC 81 MG TABLET    Take 81 mg by mouth daily.   BRIMONIDINE (ALPHAGAN) 0.2 % OPHTHALMIC SOLUTION    Place 1 drop into the left eye Twice daily.    CLOTRIMAZOLE (LOTRIMIN) 1 % CREAM    Apply 1 application topically 2 (two) times daily as needed.   FAMOTIDINE (PEPCID) 20 MG TABLET    Take 20 mg by  mouth at bedtime as needed for heartburn or indigestion.   MULTIPLE VITAMIN (MULTIVITAMIN) TABLET    Take 1 tablet by mouth daily.   NAPROXEN SODIUM 220 MG CAPS    Take 1 capsule by mouth daily as needed.   TIMOLOL (TIMOPTIC) 0.5 % OPHTHALMIC SOLUTION    Place 1  drop into the left eye daily.   Modified Medications   No medications on file  Discontinued Medications   No medications on file    Review of Systems  Constitutional: Positive for fatigue and unexpected weight change (weight loss).  HENT: Positive for hearing loss (wears hearing aids).   Respiratory: Positive for cough.   Skin: Positive for rash (itchy on right UE from yellow jacket stings).  Neurological: Positive for weakness.  Psychiatric/Behavioral: The patient is nervous/anxious.        Memory loss; confusion  Per new pt packet  Vitals:   01/30/17 1041  BP: (!) 100/52  Pulse: (!) 56  Resp: 18  Temp: 97.7 F (36.5 C)  TempSrc: Oral  SpO2: 95%  Weight: 143 lb 3.2 oz (65 kg)  Height: '5\' 6"'  (1.676 m)   Body mass index is 23.11 kg/m.  Physical Exam  Constitutional: He appears well-developed and well-nourished.  Thin but looks well; muscular  HENT:  Mouth/Throat: Oropharynx is clear and moist.  oropharynx cobblestoning but no redness or exudate. No sinus TTP. MMM; no oral thrush   Eyes: Pupils are equal, round, and reactive to light. No scleral icterus.  OS unreactive to light; lens opaque; OD PERRL  Neck: Neck supple. Carotid bruit is not present. No thyromegaly present.  Cardiovascular: Normal rate, regular rhythm and intact distal pulses.  Exam reveals no gallop and no friction rub.   Murmur (1/6 SEM ) heard. No distal LE edema. No calf TTP  Pulmonary/Chest: Effort normal and breath sounds normal. He has no wheezes. He has no rales. He exhibits no tenderness.  Abdominal: Soft. Normal appearance and bowel sounds are normal. He exhibits no distension, no abdominal bruit, no pulsatile midline mass and no mass. There is no hepatomegaly. There is no tenderness. There is no rigidity, no rebound and no guarding. No hernia.  Musculoskeletal: He exhibits edema and deformity (small and large joint). He exhibits no tenderness.  Lymphadenopathy:    He has no cervical  adenopathy.  Neurological: He is alert.  Skin: Skin is warm and dry. Rash noted. Rash is nodular.     Psychiatric: He has a normal mood and affect. His behavior is normal. Thought content normal.     Labs reviewed: Lab on 01/04/2017  Component Date Value Ref Range Status  . Color, Urine 01/04/2017 YELLOW  Yellow;Lt. Yellow Final  . APPearance 01/04/2017 CLEAR  Clear Final  . Specific Gravity, Urine 01/04/2017 1.020  1.000 - 1.030 Final  . pH 01/04/2017 6.0  5.0 - 8.0 Final  . Total Protein, Urine 01/04/2017 NEGATIVE  Negative Final  . Urine Glucose 01/04/2017 NEGATIVE  Negative Final  . Ketones, ur 01/04/2017 NEGATIVE  Negative Final  . Bilirubin Urine 01/04/2017 NEGATIVE  Negative Final  . Hgb urine dipstick 01/04/2017 NEGATIVE  Negative Final  . Urobilinogen, UA 01/04/2017 0.2  0.0 - 1.0 Final  . Leukocytes, UA 01/04/2017 NEGATIVE  Negative Final  . Nitrite 01/04/2017 NEGATIVE  Negative Final    No results found.   Assessment/Plan   ICD-10-CM   1. Bee sting, accidental or unintentional, initial encounter T63.441A methylPREDNISolone (MEDROL DOSEPAK) 4 MG TBPK tablet  2.  Fatigue, unspecified type R53.83 CBC with Differential/Platelets    TSH    CMP with eGFR  3. B12 deficiency E53.8 Vitamin B12  4. Primary open angle glaucoma, unspecified glaucoma stage, unspecified laterality H40.1190   5. Memory impairment R41.3 CMP with eGFR  6. Seasonal allergic rhinitis, unspecified trigger J30.2 montelukast (SINGULAIR) 5 MG chewable tablet  7. Reduced vision H54.7    L>R eye   He will need pneumovax and Shingrix next ov  Education handouts given on cognitive impairment  START ASPIRIN 81MG daily  START SINGULAIR 5MG at bedtime for seasonal allergy  DEPO-MEDROL injection given today. He will start medrol dose pak in the MA  Continue other medications as ordered  Follow up with Dr Delice Lesch as scheduled.  Will call with lab results  Follow up in 1 month for AWV/CPE.  [name]  [name]  [facility] [address] [place], [place] [address] [phone] Cell (Monday-Friday 8 AM - 5 PM) [phone] After 5 PM and follow prompts

## 2017-01-31 LAB — COMPLETE METABOLIC PANEL WITH GFR
ALT: 13 U/L (ref 9–46)
AST: 23 U/L (ref 10–35)
Albumin: 4.1 g/dL (ref 3.6–5.1)
Alkaline Phosphatase: 181 U/L — ABNORMAL HIGH (ref 40–115)
BUN: 22 mg/dL (ref 7–25)
CO2: 24 mmol/L (ref 20–32)
Calcium: 9.5 mg/dL (ref 8.6–10.3)
Chloride: 104 mmol/L (ref 98–110)
Creat: 1.34 mg/dL — ABNORMAL HIGH (ref 0.70–1.11)
GFR, Est African American: 54 mL/min — ABNORMAL LOW (ref >=60)
GFR, Est Non African American: 47 mL/min — ABNORMAL LOW (ref >=60)
Glucose, Bld: 82 mg/dL (ref 65–99)
Potassium: 4.6 mmol/L (ref 3.5–5.3)
Sodium: 142 mmol/L (ref 135–146)
Total Bilirubin: 0.6 mg/dL (ref 0.2–1.2)
Total Protein: 7 g/dL (ref 6.1–8.1)

## 2017-01-31 LAB — VITAMIN B12: Vitamin B-12: 507 pg/mL (ref 200–1100)

## 2017-02-27 ENCOUNTER — Other Ambulatory Visit: Payer: Self-pay | Admitting: *Deleted

## 2017-02-27 ENCOUNTER — Encounter: Payer: Self-pay | Admitting: Internal Medicine

## 2017-02-27 ENCOUNTER — Ambulatory Visit (INDEPENDENT_AMBULATORY_CARE_PROVIDER_SITE_OTHER): Payer: Medicare Other | Admitting: Internal Medicine

## 2017-02-27 VITALS — BP 122/84 | HR 63 | Temp 98.4°F | Ht 66.0 in | Wt 146.0 lb

## 2017-02-27 DIAGNOSIS — B353 Tinea pedis: Secondary | ICD-10-CM

## 2017-02-27 DIAGNOSIS — I10 Essential (primary) hypertension: Secondary | ICD-10-CM | POA: Diagnosis not present

## 2017-02-27 DIAGNOSIS — H409 Unspecified glaucoma: Secondary | ICD-10-CM

## 2017-02-27 DIAGNOSIS — J302 Other seasonal allergic rhinitis: Secondary | ICD-10-CM

## 2017-02-27 DIAGNOSIS — E784 Other hyperlipidemia: Secondary | ICD-10-CM | POA: Diagnosis not present

## 2017-02-27 DIAGNOSIS — R9431 Abnormal electrocardiogram [ECG] [EKG]: Secondary | ICD-10-CM

## 2017-02-27 DIAGNOSIS — R413 Other amnesia: Secondary | ICD-10-CM | POA: Diagnosis not present

## 2017-02-27 DIAGNOSIS — E538 Deficiency of other specified B group vitamins: Secondary | ICD-10-CM | POA: Diagnosis not present

## 2017-02-27 DIAGNOSIS — Z23 Encounter for immunization: Secondary | ICD-10-CM

## 2017-02-27 DIAGNOSIS — Z0000 Encounter for general adult medical examination without abnormal findings: Secondary | ICD-10-CM | POA: Diagnosis not present

## 2017-02-27 DIAGNOSIS — N289 Disorder of kidney and ureter, unspecified: Secondary | ICD-10-CM | POA: Diagnosis not present

## 2017-02-27 DIAGNOSIS — R001 Bradycardia, unspecified: Secondary | ICD-10-CM | POA: Diagnosis not present

## 2017-02-27 DIAGNOSIS — E7849 Other hyperlipidemia: Secondary | ICD-10-CM

## 2017-02-27 MED ORDER — ZOSTER VAC RECOMB ADJUVANTED 50 MCG/0.5ML IM SUSR: 0.5000 mL | Freq: Once | INTRAMUSCULAR | 1 refills | Status: AC

## 2017-02-27 NOTE — Patient Instructions (Signed)
Will call with cardiology referral  Continue current medications as ordered  START OTC LIQUID VITAMIN B12 DROPS - 1000MCG DAILY  START OTC TOPICAL LAMISIL CREAM BETWEEN TOES DAILY X 4-6 WEEKS. IF NO BETTER, WILL NEED PRESCRIPTION STRENGTH MEDICATION  Follow up in 3 mos. Fasting lab in 2-3 weeks.  Keeping you healthy  Get these tests  Blood pressure- Have your blood pressure checked once a year by your healthcare provider.  Normal blood pressure is 120/80  Weight- Have your body mass index (BMI) calculated to screen for obesity.  BMI is a measure of body fat based on height and weight. You can also calculate your own BMI at ViewBanking.si.  Cholesterol- Have your cholesterol checked every year.  Diabetes- Have your blood sugar checked regularly if you have high blood pressure, high cholesterol, have a family history of diabetes or if you are overweight.  Screening for Colon Cancer- Colonoscopy starting at age 79.  Screening may begin sooner depending on your family history and other health conditions. Follow up colonoscopy as directed by your Gastroenterologist.  Screening for Prostate Cancer- Both blood work (PSA) and a rectal exam help screen for Prostate Cancer.  Screening begins at age 21 with African-American men and at age 59 with Caucasian men.  Screening may begin sooner depending on your family history.  Take these medicines  Aspirin- One aspirin daily can help prevent Heart disease and Stroke.  Flu shot- Every fall.  Tetanus- Every 10 years.  Zostavax- Once after the age of 22 to prevent Shingles.  Pneumonia shot- Once after the age of 85; if you are younger than 80, ask your healthcare provider if you need a Pneumonia shot.  Take these steps  Don't smoke- If you do smoke, talk to your doctor about quitting.  For tips on how to quit, go to www.smokefree.gov or call 1-800-QUIT-NOW.  Be physically active- Exercise 5 days a week for at least 30 minutes.  If  you are not already physically active start slow and gradually work up to 30 minutes of moderate physical activity.  Examples of moderate activity include walking briskly, mowing the yard, dancing, swimming, bicycling, etc.  Eat a healthy diet- Eat a variety of healthy food such as fruits, vegetables, low fat milk, low fat cheese, yogurt, lean meant, poultry, fish, beans, tofu, etc. For more information go to www.thenutritionsource.org  Drink alcohol in moderation- Limit alcohol intake to less than two drinks a day. Never drink and drive.  Dentist- Brush and floss twice daily; visit your dentist twice a year.  Depression- Your emotional health is as important as your physical health. If you're feeling down, or losing interest in things you would normally enjoy please talk to your healthcare provider.  Eye exam- Visit your eye doctor every year.  Safe sex- If you may be exposed to a sexually transmitted infection, use a condom.  Seat belts- Seat belts can save your life; always wear one.  Smoke/Carbon Monoxide detectors- These detectors need to be installed on the appropriate level of your home.  Replace batteries at least once a year.  Skin cancer- When out in the sun, cover up and use sunscreen 15 SPF or higher.  Violence- If anyone is threatening you, please tell your healthcare provider.  Living Will/ Health care power of attorney- Speak with your healthcare provider and family.

## 2017-02-27 NOTE — Progress Notes (Signed)
Patient ID: [name], male   DOB: 24-May-1926, 83 y.o.   MRN: [id number]   Location:      Place of Service:    Provider:   Patient Care Team: Gildardo Cranker, DO as PCP - General (Internal Medicine) [name], MD as Consulting Physician (Ophthalmology) [name], MD as Consulting Physician (Neurology) [name], MD as Consulting Physician (Pulmonary Disease)  Extended Emergency Contact Information Primary Emergency Contact: Gutierrez,Dave Address: [address]          [place], [place] [address] [place] Phone: [phone] Mobile Phone: [phone] Relation: Spouse Secondary Emergency Contact: [name] Address: [address] [place], [place] [address] [place] Work Phone: [phone] Mobile Phone: [phone] Relation: Daughter  Code Status: FULL CODE Goals of Care: Advanced Directive information Advanced Directives 02/27/2017  Does Patient Have a Medical Advance Directive? Yes  Type of Advance Directive [place]  Does patient want to make changes to medical advance directive? No - Patient declined  Copy of [place] in Chart? Yes     Chief Complaint  Patient presents with  . Annual Exam    Annual Physical    HPI: Patient is a 83 y.o. male seen in today for a complete physical. He presents with his wife today. [name] only complaint is a sensation between his right 4th and 5th toes that he has notice in the past month. He says it does not hurt him and he rates rates the pain at 0/10. He says he only notices it when he walks.  No flowsheet data found.  Fall Risk  09/19/2016 03/14/2016  Falls in the past year? No No   MMSE - Mini Mental State Exam 09/19/2016 03/14/2016 09/28/2015 03/15/2015  Orientation to time 4 1 2 3   Orientation to Place 4 5 4 5   Registration 3 3 3 3   Attention/ Calculation 4 4 4 4   Recall 0 0 0 0  Language- name 2  objects 2 2 2 2   Language- repeat 1 1 1 1   Language- follow 3 step command 3 3 1 3   Language- read & follow direction 1 1 1 1   Write a sentence 1 1 1 1   Copy design 0 1 1 1   Total score 23 22 20 24      Health Maintenance  Topic Date Due  . PNA vac Low Risk Adult (2 of 2 - PPSV23) 04/27/2016  . INFLUENZA VACCINE  01/10/2017  . TETANUS/TDAP  12/25/2021    Urinary incontinence? Functional Status Survey:   Exercise? Diet? No exam data present Hearing:   Dentition: Pain:  Past Medical History:  Diagnosis Date  . BPH (benign prostatic hypertrophy)   . Cataract    Both  . Diverticula of colon   . Glaucoma 1986   Dr. Mila Merry  . Hearing loss   . Hx of adenomatous colonic polyps   . Internal hemorrhoids   . Left varicocele   . Obesity   . Pseudodementia   . RBBB (right bundle branch block)     Past Surgical History:  Procedure Laterality Date  . CATARACT EXTRACTION Right   . GLAUCOMA REPAIR Left     Family History  Problem Relation Age of Onset  . Heart disease Father 64  . Parkinson's disease Father   . Cancer Mother        ? type  .  Colon cancer Neg Hx     Social History   Social History  . Marital status: Married    Spouse name: N/A  . Number of children: N/A  . Years of education: N/A   Social History Main Topics  . Smoking status: Former Smoker    Packs/day: 0.30    Years: 10.00    Types: Cigarettes    Quit date: 06/12/1956  . Smokeless tobacco: Never Used  . Alcohol use No  . Drug use: No  . Sexual activity: No   Other Topics Concern  . None   Social History Narrative   Social History      Diet?       Do you drink/eat things with caffeine? Coffee, tea      Marital status?           married                         What year were you married? 1957      Do you live in a house, apartment, assisted living, condo, trailer, etc.? house      Is it one or more stories? one      How many persons live in your home? 2      Do you have  any pets in your home? (please list) no      Highest level of education completed? High school      Current or past profession: Camera operator      Do you exercise?           yes                           Type & how often? Walking everyday weather permitting      Do you have a living will? yes      Do you have a DNR form?      no                            If not, do you want to discuss one?      Do you have signed POA/HPOA for forms? yes      Functional Status      Do you have difficulty bathing or dressing yourself? no      Do you have difficulty preparing food or eating? no      Do you have difficulty managing your medications? no      Do you have difficulty managing your finances? yes      Do you have difficulty affording your medications? no       reports that he quit smoking about 60 years ago. His smoking use included Cigarettes. He has a 3.00 pack-year smoking history. He has never used smokeless tobacco. He reports that he does not drink alcohol or use drugs.   No Known Allergies  Outpatient Encounter Prescriptions as of 02/27/2017  Medication Sig  . aspirin EC 81 MG tablet Take 81 mg by mouth daily.  . brimonidine (ALPHAGAN) 0.2 % ophthalmic solution Place 1 drop into the left eye Twice daily.   . clotrimazole (LOTRIMIN) 1 % cream Apply 1 application topically 2 (two) times daily as needed.  . famotidine (PEPCID) 20 MG tablet Take 20 mg by mouth at bedtime as needed for heartburn or indigestion.  . methylPREDNISolone (  MEDROL DOSEPAK) 4 MG TBPK tablet Take as directed. START 01/31/17  . montelukast (SINGULAIR) 5 MG chewable tablet Chew 1 tablet (5 mg total) by mouth at bedtime.  . Multiple Vitamin (MULTIVITAMIN) tablet Take 1 tablet by mouth daily.  . Naproxen Sodium 220 MG CAPS Take 1 capsule by mouth daily as needed.  . timolol (TIMOPTIC) 0.5 % ophthalmic solution Place 1 drop into the left eye daily.   Marland Kitchen Zoster Vac Recomb Adjuvanted  [facility]) injection Inject 0.5 mLs into the muscle once. With 2nd shot administered 2-6 months after first shot.   No facility-administered encounter medications on file as of 02/27/2017.      Review of Systems:  Review of Systems  Constitutional: Negative for activity change, appetite change, chills, diaphoresis, fatigue, fever and unexpected weight change.  HENT: Negative for congestion, dental problem, drooling, ear discharge, ear pain, facial swelling, hearing loss, mouth sores, postnasal drip, rhinorrhea, sinus pain, sinus pressure, sore throat, tinnitus, trouble swallowing and voice change.   Eyes: Negative for photophobia, pain, discharge, redness, itching and visual disturbance.  Respiratory: Negative for apnea, cough, choking, chest tightness, shortness of breath, wheezing and stridor.   Cardiovascular: Negative for chest pain, palpitations and leg swelling.  Gastrointestinal: Negative for abdominal distention, abdominal pain, anal bleeding, blood in stool, constipation, diarrhea, nausea, rectal pain and vomiting.  Endocrine: Negative for cold intolerance, heat intolerance, polydipsia, polyphagia and polyuria.  Genitourinary: Negative for decreased urine volume, difficulty urinating, discharge, dysuria, enuresis, flank pain, frequency, genital sores, hematuria, penile pain, penile swelling, scrotal swelling, testicular pain and urgency.  Musculoskeletal: Negative for arthralgias, back pain, gait problem, joint swelling, myalgias, neck pain and neck stiffness.  Skin: Negative for color change, pallor, rash and wound.  Allergic/Immunologic: Negative for environmental allergies, food allergies and immunocompromised state.  Neurological: Negative for dizziness, tremors, seizures, syncope, facial asymmetry, speech difficulty, weakness, light-headedness, numbness and headaches.  Hematological: Negative for adenopathy. Does not bruise/bleed easily.  Psychiatric/Behavioral: Negative for  agitation, behavioral problems, confusion, hallucinations, self-injury, sleep disturbance and suicidal ideas. The patient is not nervous/anxious.     Physical Exam: Vitals:   02/27/17 1455  BP: 122/84  Pulse: 63  Temp: 98.4 F (36.9 C)  TempSrc: Oral  SpO2: 97%  Weight: 146 lb (66.2 kg)  Height: 5\' 6"  (1.676 m)   Body mass index is 23.57 kg/m. Physical Exam  Constitutional: He is oriented to person, place, and time. He appears well-nourished. No distress.  HENT:  Head: Normocephalic and atraumatic.  Right Ear: External ear normal.  Left Ear: External ear normal.  Nose: Nose normal.  Mouth/Throat: Oropharynx is clear and moist. No oropharyngeal exudate.  Eyes: Right eye exhibits no discharge. No scleral icterus. Right eye exhibits normal extraocular motion and no nystagmus. Right pupil is round and reactive. Left pupil is not reactive (Glaucoma present; patient can determin light and dark). Left pupil is round. Pupils are unequal.  Neck: Normal range of motion. Neck supple. No JVD present. No tracheal deviation present. No thyromegaly present.  Cardiovascular: Normal rate and intact distal pulses.  Exam reveals no gallop and no friction rub.   Murmur heard. Pulmonary/Chest: Effort normal and breath sounds normal. No stridor. No respiratory distress. He has no wheezes. He has no rales. He exhibits no tenderness.  Abdominal: Soft. Bowel sounds are normal. He exhibits no distension and no mass. There is no tenderness. There is no rebound and no guarding.  Musculoskeletal: Normal range of motion. He exhibits no edema, tenderness or deformity.  Lymphadenopathy:    He has no cervical adenopathy.  Neurological: He is alert and oriented to person, place, and time. He has normal reflexes. He displays normal reflexes. No cranial nerve deficit. He exhibits normal muscle tone. Coordination normal.  Skin: Skin is warm. No rash noted. He is not diaphoretic. No erythema. No pallor.  Psychiatric:  He has a normal mood and affect. His behavior is normal. Thought content normal.    Labs reviewed: Basic Metabolic Panel:  Recent Labs  01/30/17 1032  NA 142  K 4.6  CL 104  CO2 24  GLUCOSE 82  BUN 22  CREATININE 1.34*  CALCIUM 9.5  TSH 1.90   Liver Function Tests:  Recent Labs  01/30/17 1032  AST 23  ALT 13  ALKPHOS 181*  BILITOT 0.6  PROT 7.0  ALBUMIN 4.1   No results for input(s): LIPASE, AMYLASE in the last 8760 hours. No results for input(s): AMMONIA in the last 8760 hours. CBC:  Recent Labs  01/30/17 1032  WBC 4.2  NEUTROABS 2,394  HGB 12.2*  HCT 37.2*  MCV 92.3  PLT 190   Lipid Panel: No results for input(s): CHOL, HDL, LDLCALC, TRIG, CHOLHDL, LDLDIRECT in the last 8760 hours. No results found for: HGBA1C  Procedures: No results found.  Assessment/Plan 1. Other hyperlipidemia  - EKG 12-Lead  2. Essential hypertension  - EKG 12-Lead    Labs/tests ordered:  @ORDERS @ Next appt:  @NEXTENCTHIS  DEPT@

## 2017-02-27 NOTE — Progress Notes (Signed)
Patient ID: [name], male   DOB: 1926/02/07, 83 y.o.   MRN: [id number]   Location:  [facility]  Place of Service:  OFFICE  Provider: Arletha Grippe, DO  Patient Care Team: Gildardo Cranker, DO as PCP - General (Internal Medicine) [name], MD as Consulting Physician (Ophthalmology) [name], MD as Consulting Physician (Neurology) [name], MD as Consulting Physician (Pulmonary Disease)  Extended Emergency Contact Information Primary Emergency Contact: Lisanti,[name] Address: [address]          [place], [place] [address] [place] Phone: [phone] Mobile Phone: [phone] Relation: Spouse Secondary Emergency Contact: Vanetta Shawl Address: [address] [place], [place] [address] [place] Work Phone: [phone] Mobile Phone: [phone] Relation: Daughter  Code Status: FULL CODE Goals of Care: Advanced Directive information Advanced Directives 02/27/2017  Does Patient Have a Medical Advance Directive? Yes  Type of Advance Directive [place]  Does patient want to make changes to medical advance directive? No - Patient declined  Copy of Edie in Chart? Yes     Chief Complaint  Patient presents with  . Annual Exam    Annual Physical    HPI: Patient is a 83 y.o. male "[name]" seen in today for an annual wellness exam. He is a poor historian due to cognitive impairment/memory loss. Hx obtained from spouse.   Seasonal allergic rhinitis - improved on singulair. He completed medrol dosepak.   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 He has OD cats with IOL in past. He uses glaucoma gtts in OS (OD has bleb). Followed by ophthamology Dr Tora Kindred  Mild cognitive impairment - followed by Dr Delice Lesch. Tried aricept but it caused more sedation/wekaness. Stopped med and sx's improved. He remains active at home  and community. No major impact on ADLs.  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 Appetite is excellent and he has gained a few lbs over the last several mos. MMSE 23/30.  B12 deficiency - improved. He has rec'd B12 injections in the past. Last B12 level 507 (340 in 2016)  HTN - diet controlled. BP 100/52.  Chart reviewed. He will be celebrating 58 yrs of marriage in Jan 2019  No flowsheet data found.  Fall Risk  09/19/2016 03/14/2016  Falls in the past year? No No   MMSE - Mini Mental State Exam 09/19/2016 03/14/2016 09/28/2015 03/15/2015  Orientation to time 4 1 2 3   Orientation to Place 4 5 4 5   Registration 3 3 3 3   Attention/ Calculation 4 4 4 4   Recall 0 0 0 0  Language- name 2 objects 2 2 2 2   Language- repeat 1 1 1 1   Language- follow 3 step command 3 3 1 3   Language- read & follow direction 1 1 1 1   Write a sentence 1 1 1 1   Copy design 0 1 1 1   Total score 23 22 20 24      Health Maintenance  Topic Date Due  . PNA vac Low Risk Adult (2 of 2 - PPSV23) 04/27/2016  . TETANUS/TDAP  12/25/2021  . INFLUENZA VACCINE  Completed    Urinary incontinence? No issues  Functional Status Survey: Is the patient deaf or have difficulty hearing?: Yes Does the patient have difficulty seeing, even when wearing glasses/contacts?: Yes Does the patient have difficulty  concentrating, remembering, or making decisions?: Yes Does the patient have difficulty walking or climbing stairs?: No Does the patient have difficulty dressing or bathing?: No Does the patient have difficulty doing errands alone such as visiting a doctor's office or shopping?: Yes  Exercise? Up as tolerated  Diet? Eats 2 meals per day and wonders if boost/ensure needed between meals  No exam data present  Hearing: he uses hearing aids    Dentition: no issues  Pain: none  Past Medical History:  Diagnosis Date  . BPH (benign prostatic hypertrophy)   . Cataract    Both  . Diverticula of colon    . Glaucoma 1986   Dr. Mila Merry  . Hearing loss   . Hx of adenomatous colonic polyps   . Internal hemorrhoids   . Left varicocele   . Obesity   . Pseudodementia   . RBBB (right bundle branch block)     Past Surgical History:  Procedure Laterality Date  . CATARACT EXTRACTION Right   . GLAUCOMA REPAIR Left     Family History  Problem Relation Age of Onset  . Heart disease Father 41  . Parkinson's disease Father   . Cancer Mother        ? type  . Colon cancer Neg Hx    Family Status  Relation Status  . Father Deceased  . Mother Deceased  . Brother Alive  . Brother Alive  . Brother Alive  . Brother Alive  . Daughter Alive  . Daughter Alive  . Daughter Alive  . Son Alive  . Neg Hx (Not Specified)    Social History   Social History  . Marital status: Married    Spouse name: N/A  . Number of children: N/A  . Years of education: N/A   Occupational History  . Not on file.   Social History Main Topics  . Smoking status: Former Smoker    Packs/day: 0.30    Years: 10.00    Types: Cigarettes    Quit date: 06/12/1956  . Smokeless tobacco: Never Used  . Alcohol use No  . Drug use: No  . Sexual activity: No   Other Topics Concern  . Not on file   Social History Narrative   Social History      Diet?       Do you drink/eat things with caffeine? Coffee, tea      Marital status?           married                         What year were you married? 1957      Do you live in a house, apartment, assisted living, condo, trailer, etc.? house      Is it one or more stories? one      How many persons live in your home? 2      Do you have any pets in your home? (please list) no      Highest level of education completed? High school      Current or past profession: Camera operator      Do you exercise?           yes                           Type & how often? Walking everyday weather permitting  Do you have a living will? yes       Do you have a DNR form?      no                            If not, do you want to discuss one?      Do you have signed POA/HPOA for forms? yes      Functional Status      Do you have difficulty bathing or dressing yourself? no      Do you have difficulty preparing food or eating? no      Do you have difficulty managing your medications? no      Do you have difficulty managing your finances? yes      Do you have difficulty affording your medications? no       No Known Allergies  Allergies as of 02/27/2017   No Known Allergies     Medication List       Accurate as of 02/27/17 11:59 PM. Always use your most recent med list.          aspirin EC 81 MG tablet Take 81 mg by mouth daily.   brimonidine 0.2 % ophthalmic solution Commonly known as:  ALPHAGAN Place 1 drop into the left eye Twice daily.   clotrimazole 1 % cream Commonly known as:  LOTRIMIN Apply 1 application topically 2 (two) times daily as needed.   famotidine 20 MG tablet Commonly known as:  PEPCID Take 20 mg by mouth at bedtime as needed for heartburn or indigestion.   methylPREDNISolone 4 MG Tbpk tablet Commonly known as:  MEDROL DOSEPAK Take as directed. START 01/31/17   montelukast 5 MG chewable tablet Commonly known as:  SINGULAIR Chew 1 tablet (5 mg total) by mouth at bedtime.   multivitamin tablet Take 1 tablet by mouth daily.   Naproxen Sodium 220 MG Caps Take 1 capsule by mouth daily as needed.   timolol 0.5 % ophthalmic solution Commonly known as:  TIMOPTIC Place 1 drop into the left eye daily.   Zoster Vac Recomb Adjuvanted injection Commonly known as:  SHINGRIX Inject 0.5 mLs into the muscle once. With 2nd shot administered 2-6 months after first shot.            Discharge Care Instructions        Start     Ordered   02/27/17 0000  EKG 12-Lead     02/27/17 1511   02/27/17 0000  Lipid Panel     02/27/17 1642   02/27/17 0000  Ambulatory referral to Cardiology      02/27/17 1642   02/27/17 0000  Flu Vaccine QUAD 36+ mos IM     02/27/17 1652       Review of Systems:  Review of Systems  Unable to perform ROS: Other (memory impairment)    Physical Exam: Vitals:   02/27/17 1455  BP: 122/84  Pulse: 63  Temp: 98.4 F (36.9 C)  TempSrc: Oral  SpO2: 97%  Weight: 146 lb (66.2 kg)  Height: 5\' 6"  (1.676 m)   Body mass index is 23.57 kg/m. Physical Exam  Constitutional: He appears well-developed and well-nourished. No distress.  HENT:  Head: Normocephalic and atraumatic.  Right Ear: External ear normal.  Left Ear: External ear normal.  Mouth/Throat: Oropharynx is clear and moist. No oropharyngeal exudate.  MMM; no oral thrush  Eyes: Pupils are equal, round, and reactive to light.  EOM are normal. No scleral icterus.  OS lens opaque and nonreactive to light  Neck: Normal range of motion. Neck supple. Carotid bruit is not present. No tracheal deviation present. No thyromegaly present.  Cardiovascular: Regular rhythm and intact distal pulses.  Bradycardia present.  Exam reveals no gallop and no friction rub.   Murmur heard.  Systolic murmur is present with a grade of 1/6  Abnormal S2; No LE edema b/l. No calf TTP  Pulmonary/Chest: Effort normal and breath sounds normal. No respiratory distress. He has no wheezes. He has no rales. He exhibits no tenderness.  No rhonchi  Abdominal: Soft. Bowel sounds are normal. He exhibits no distension and no mass. There is no hepatosplenomegaly or hepatomegaly. There is no tenderness. There is no rebound and no guarding. No hernia.  Musculoskeletal: He exhibits edema. He exhibits no deformity.  Lymphadenopathy:    He has no cervical adenopathy.  Neurological: He is alert. He has normal reflexes.  Skin: Skin is warm and dry. No rash noted.  Multiple nevi  Psychiatric: He has a normal mood and affect. His behavior is normal. Cognition and memory are impaired. He exhibits abnormal recent memory.  Vitals  reviewed.   Labs reviewed:  Basic Metabolic Panel:  Recent Labs  01/30/17 1032  NA 142  K 4.6  CL 104  CO2 24  GLUCOSE 82  BUN 22  CREATININE 1.34*  CALCIUM 9.5  TSH 1.90   Liver Function Tests:  Recent Labs  01/30/17 1032  AST 23  ALT 13  ALKPHOS 181*  BILITOT 0.6  PROT 7.0  ALBUMIN 4.1   No results for input(s): LIPASE, AMYLASE in the last 8760 hours. No results for input(s): AMMONIA in the last 8760 hours. CBC:  Recent Labs  01/30/17 1032  WBC 4.2  NEUTROABS 2,394  HGB 12.2*  HCT 37.2*  MCV 92.3  PLT 190   Lipid Panel: No results for input(s): CHOL, HDL, LDLCALC, TRIG, CHOLHDL, LDLDIRECT in the last 8760 hours. No results found for: HGBA1C  Procedures: No results found.  ECG OBTAINED AND REVIEWED BY MYSELF: SB @ 54 bpm, LAD, LAE, PVC, IVCD, poor R wave progression. No acute ischemic changes. No other ECG available to compare Assessment/Plan   ICD-10-CM   1. Medicare annual wellness visit, subsequent Z00.00   2. Tinea pedis of both feet B35.3   3. Abnormal ECG R94.31 Ambulatory referral to Cardiology  4. Other hyperlipidemia E78.4 EKG 12-Lead    Lipid Panel  5. Essential hypertension I10 EKG 12-Lead  6. Memory impairment R41.3   7. Seasonal allergic rhinitis, unspecified trigger J30.2   8. Renal insufficiency N28.9   9. Glaucoma, unspecified glaucoma type, unspecified laterality H40.9   10. B12 deficiency E53.8   11. Bradycardia R00.1 Ambulatory referral to Cardiology  12. Need for immunization against influenza Z23 Flu Vaccine QUAD 36+ mos IM   Will call with cardiology referral  Continue current medications as ordered  START OTC LIQUID VITAMIN B12 DROPS - 1000MCG DAILY  START OTC TOPICAL LAMISIL CREAM BETWEEN TOES DAILY X 4-6 WEEKS. IF NO BETTER, WILL NEED PRESCRIPTION STRENGTH MEDICATION  Follow up in 3 mos. Fasting lab in 2-3 weeks.  Keeping you healthy handout given    Cordella Register. Perlie Gold  [facility] [address] [place],  [address] [phone] Cell (Monday-Friday 8 AM - 5 PM) [phone] After 5 PM and follow prompts

## 2017-03-08 ENCOUNTER — Ambulatory Visit (INDEPENDENT_AMBULATORY_CARE_PROVIDER_SITE_OTHER): Payer: Medicare Other | Admitting: Cardiology

## 2017-03-08 ENCOUNTER — Encounter: Payer: Self-pay | Admitting: Cardiology

## 2017-03-08 VITALS — BP 110/64 | HR 88 | Resp 12 | Ht 66.0 in | Wt 146.0 lb

## 2017-03-08 DIAGNOSIS — I10 Essential (primary) hypertension: Secondary | ICD-10-CM

## 2017-03-08 DIAGNOSIS — E7849 Other hyperlipidemia: Secondary | ICD-10-CM

## 2017-03-08 DIAGNOSIS — I4510 Unspecified right bundle-branch block: Secondary | ICD-10-CM | POA: Diagnosis not present

## 2017-03-08 DIAGNOSIS — E784 Other hyperlipidemia: Secondary | ICD-10-CM | POA: Diagnosis not present

## 2017-03-08 DIAGNOSIS — R0602 Shortness of breath: Secondary | ICD-10-CM

## 2017-03-08 DIAGNOSIS — R9431 Abnormal electrocardiogram [ECG] [EKG]: Secondary | ICD-10-CM | POA: Diagnosis not present

## 2017-03-08 NOTE — Addendum Note (Signed)
Addended by: Kathyrn Sheriff on: 03/08/2017 02:02 PM   Modules accepted: Orders

## 2017-03-08 NOTE — Patient Instructions (Addendum)
Medication Instructions:  Your physician recommends that you continue on your current medications as directed. Please refer to the Current Medication list given to you today.  Labwork: None   Testing/Procedures: Your physician has recommended that you wear a holter monitor. Holter monitors are medical devices that record the heart's electrical activity. Doctors most often use these monitors to diagnose arrhythmias. Arrhythmias are problems with the speed or rhythm of the heartbeat. The monitor is a small, portable device. You can wear one while you do your normal daily activities. This is usually used to diagnose what is causing palpitations/syncope (passing out).   Holter Monitoring A Holter monitor is a small device that is used to detect abnormal heart rhythms. It clips to your clothing and is connected by wires to flat, sticky disks (electrodes) that attach to your chest. It is worn continuously for 24-48 hours. Follow these instructions at home:  Wear your Holter monitor at all times, even while exercising and sleeping, for as long as directed by your health care provider.  Make sure that the Holter monitor is safely clipped to your clothing or close to your body as recommended by your health care provider.  Do not get the monitor or wires wet.  Do not put body lotion or moisturizer on your chest.  Keep your skin clean.  Keep a diary of your daily activities, such as walking and doing chores. If you feel that your heartbeat is abnormal or that your heart is fluttering or skipping a beat: ? Record what you are doing when it happens. ? Record what time of day the symptoms occur.  Return your Holter monitor as directed by your health care provider.  Keep all follow-up visits as directed by your health care provider. This is important. Get help right away if:  You feel lightheaded or you faint.  You have trouble breathing.  You feel pain in your chest, upper arm, or jaw.  You  feel sick to your stomach and your skin is pale, cool, or damp.  You heartbeat feels unusual or abnormal. This information is not intended to replace advice given to you by your health care provider. Make sure you discuss any questions you have with your health care provider. Document Released: 02/25/2004 Document Revised: 11/04/2015 Document Reviewed: 01/05/2014 Elsevier Interactive Patient Education  2018 [place] physician has requested that you have an echocardiogram. Echocardiography is a painless test that uses sound waves to create images of your heart. It provides your doctor with information about the size and shape of your heart and how well your heart's chambers and valves are working. This procedure takes approximately one hour. There are no restrictions for this procedure. Echocardiogram An echocardiogram, or echocardiography, uses sound waves (ultrasound) to produce an image of your heart. The echocardiogram is simple, painless, obtained within a short period of time, and offers valuable information to your health care provider. The images from an echocardiogram can provide information such as:  Evidence of coronary artery disease (CAD).  Heart size.  Heart muscle function.  Heart valve function.  Aneurysm detection.  Evidence of a past heart attack.  Fluid buildup around the heart.  Heart muscle thickening.  Assess heart valve function.  Tell a health care provider about:  Any allergies you have.  All medicines you are taking, including vitamins, herbs, eye drops, creams, and over-the-counter medicines.  Any problems you or family members have had with anesthetic medicines.  Any blood disorders you have.  Any surgeries you have had.  Any medical conditions you have.  Whether you are pregnant or may be pregnant. What happens before the procedure? No special preparation is needed. Eat and drink normally. What happens during the procedure?  In  order to produce an image of your heart, gel will be applied to your chest and a wand-like tool (transducer) will be moved over your chest. The gel will help transmit the sound waves from the transducer. The sound waves will harmlessly bounce off your heart to allow the heart images to be captured in real-time motion. These images will then be recorded.  You may need an IV to receive a medicine that improves the quality of the pictures. What happens after the procedure? You may return to your normal schedule including diet, activities, and medicines, unless your health care provider tells you otherwise. This information is not intended to replace advice given to you by your health care provider. Make sure you discuss any questions you have with your health care provider. Document Released: 05/26/2000 Document Revised: 01/15/2016 Document Reviewed: 02/03/2013 Elsevier Interactive Patient Education  2017 [place]: Your physician recommends that you schedule a follow-up appointment in: 1 month   Any Other Special Instructions Will Be Listed Below (If Applicable).  Please note that any paperwork needing to be filled out by the provider will need to be addressed at the front desk prior to seeing the provider. Please note that any paperwork FMLA, Disability or other documents regarding health condition is subject to a $25.00 charge that must be received prior to completion of paperwork in the form of a money order or check.    If you need a refill on your cardiac medications before your next appointment, please call your pharmacy.

## 2017-03-08 NOTE — Progress Notes (Signed)
Cardiology Consultation:    Date:  03/08/2017   ID:  [name], DOB 1926/02/28, MRN [id number]  PCP:  Gildardo Cranker, DO  Cardiologist:  Jenne Campus, MD   Referring MD: Gildardo Cranker, DO   Chief Complaint  Patient presents with  . Abnormal ECG  abnormal EKG  History of Present Illness:    [name] is a 83 y.o. male who is being seen today for the evaluation of abnormal EKG at the request of Gildardo Cranker, DO. atient was referred to [place] because a routine examination showed abnormality of the EKG. He was completely unaware of it. And he said that he is asymptomatic. His 83 years old in spite of that seems to be in quite good shape used garden every single year however this year because of very wet spring he didn't start one but decided to collect rocks. He is in the office with his wife. He denies having the symptoms, no chest pain tightness squeezing pressure burning chest, no shortness of breath nightmares. However, after reviewed the chart there is diagnosis of atypical chest pain, there is also diagnosis of dyslipidemia as well as diagnosis of dyspnea  Past Medical History:  Diagnosis Date  . BPH (benign prostatic hypertrophy)   . Cataract    Both  . Diverticula of colon   . Glaucoma 1986   Dr. Mila Merry  . Hearing loss   . Hx of adenomatous colonic polyps   . Internal hemorrhoids   . Left varicocele   . Obesity   . Pseudodementia   . RBBB (right bundle branch block)     Past Surgical History:  Procedure Laterality Date  . CATARACT EXTRACTION Right   . GLAUCOMA REPAIR Left     Current Medications: Current Meds  Medication Sig  . aspirin EC 81 MG tablet Take 81 mg by mouth daily.  . brimonidine (ALPHAGAN) 0.2 % ophthalmic solution Place 1 drop into the left eye Twice daily.   . clotrimazole (LOTRIMIN) 1 % cream Apply 1 application topically 2 (two) times daily as needed.  . famotidine (PEPCID) 20 MG tablet Take 20 mg by mouth at bedtime as  needed for heartburn or indigestion.  . montelukast (SINGULAIR) 5 MG chewable tablet Chew 1 tablet (5 mg total) by mouth at bedtime.  . Multiple Vitamin (MULTIVITAMIN) tablet Take 1 tablet by mouth daily.  . Naproxen Sodium 220 MG CAPS Take 1 capsule by mouth daily as needed.  . timolol (TIMOPTIC) 0.5 % ophthalmic solution Place 1 drop into the left eye daily.      Allergies:   Patient has no known allergies.   Social History   Social History  . Marital status: Married    Spouse name: N/A  . Number of children: N/A  . Years of education: N/A   Social History Main Topics  . Smoking status: Former Smoker    Packs/day: 0.30    Years: 10.00    Types: Cigarettes    Quit date: 06/12/1956  . Smokeless tobacco: Never Used  . Alcohol use No  . Drug use: No  . Sexual activity: No   Other Topics Concern  . None   Social History Narrative   Social History      Diet?       Do you drink/eat things with caffeine? Coffee, tea      Marital status?           married  What year were you married? 1957      Do you live in a house, apartment, assisted living, condo, trailer, etc.? house      Is it one or more stories? one      How many persons live in your home? 2      Do you have any pets in your home? (please list) no      Highest level of education completed? High school      Current or past profession: Camera operator      Do you exercise?           yes                           Type & how often? Walking everyday weather permitting      Do you have a living will? yes      Do you have a DNR form?      no                            If not, do you want to discuss one?      Do you have signed POA/HPOA for forms? yes      Functional Status      Do you have difficulty bathing or dressing yourself? no      Do you have difficulty preparing food or eating? no      Do you have difficulty managing your medications? no      Do you  have difficulty managing your finances? yes      Do you have difficulty affording your medications? no        Family History: The patient's family history includes Cancer in his mother; Heart disease (age of onset: 86) in his father; Parkinson's disease in his father. There is no history of Colon cancer. ROS:   Please see the history of present illness.    All 14 point review of systems negative except as described per history of present illness.  EKGs/Labs/Other Studies Reviewed:    The following studies were reviewed today:   EKG:  EKG is  ordered today.  The ekg ordered today demonstrates ctopic atrial rhythm, right bundle branch block, left ventricular hemiblock  Recent Labs: 01/30/2017: ALT 13; BUN 22; Creat 1.34; Hemoglobin 12.2; Platelets 190; Potassium 4.6; Sodium 142; TSH 1.90  Recent Lipid Panel    Component Value Date/Time   CHOL 204 (H) 09/15/2015 1359   TRIG 63.0 09/15/2015 1359   TRIG 61 05/11/2006 1035   HDL 69.90 09/15/2015 1359   CHOLHDL 3 09/15/2015 1359   VLDL 12.6 09/15/2015 1359   LDLCALC 122 (H) 09/15/2015 1359   LDLDIRECT 106.9 12/26/2011 0946    Physical Exam:    VS:  BP 110/64   Pulse 88   Resp 12   Ht 5\' 6"  (1.676 m)   Wt 146 lb (66.2 kg)   BMI 23.57 kg/m     Wt Readings from Last 3 Encounters:  03/08/17 146 lb (66.2 kg)  02/27/17 146 lb (66.2 kg)  01/30/17 143 lb 3.2 oz (65 kg)     GEN:  Well nourished, well developed in no acute distress HEENT: Normal NECK: No JVD; No carotid bruits LYMPHATICS: No lymphadenopathy CARDIAC: RRR, no murmurs, no rubs, no gallops RESPIRATORY:  Clear to auscultation without rales, wheezing or rhonchi  ABDOMEN: Soft,  non-tender, non-distended MUSCULOSKELETAL:  No edema; No deformity  SKIN: Warm and dry NEUROLOGIC:  Alert and oriented x 3 PSYCHIATRIC:  Normal affect   ASSESSMENT:    1. Abnormal EKG   2. BUNDLE BRANCH BLOCK, RIGHT   3. Essential hypertension   4. Shortness of breath   5. Other  hyperlipidemia    PLAN:    In order of problems listed above:  1. Abnormal EKG: He does have ectopic atrial rhythm today, right bundle branch block as well as left anterior hemiblock. I asked himter monitor. He tells me that he does not have any symptoms. Meaning there is no dizziness no passing out. However on a make sure that time the stent how extensive the problem he has in conducting system and that is the reason for Holter monitor. I will also schedule him to have an echocardiogram done to look at the right ventricle  Size and function. 2. Essential hypertension: Blood pressure well-controlled today we'll continue present management. 3. Echocardiogram will be done for shortness of breath. 4. Dyslipidemia: His LDL was 122 luckily he is HDL wa 69. I recommended to repeat he's fasting profile   Medication Adjustments/Labs and Tests Ordered: Current medicines are reviewed at length with the patient today.  Concerns regarding medicines are outlined above.  Orders Placed This Encounter  Procedures  . Holter monitor - 24 hour  . ECHOCARDIOGRAM COMPLETE   No orders of the defined types were placed in this encounter.   Signed, Park Liter, MD, [facility]. 03/08/2017 11:25 AM    [place]

## 2017-03-16 ENCOUNTER — Other Ambulatory Visit: Payer: Medicare Other

## 2017-03-16 ENCOUNTER — Ambulatory Visit: Payer: Medicare Other

## 2017-03-16 ENCOUNTER — Other Ambulatory Visit: Payer: Self-pay

## 2017-03-16 DIAGNOSIS — E785 Hyperlipidemia, unspecified: Secondary | ICD-10-CM

## 2017-03-16 DIAGNOSIS — E7849 Other hyperlipidemia: Secondary | ICD-10-CM

## 2017-03-16 LAB — LIPID PANEL
Cholesterol: 186 mg/dL (ref <200)
HDL: 76 mg/dL (ref >40)
LDL Cholesterol (Calc): 96 mg/dL (calc)
Non-HDL Cholesterol (Calc): 110 mg/dL (calc) (ref <130)
Total CHOL/HDL Ratio: 2.4 (calc) (ref <5.0)
Triglycerides: 49 mg/dL (ref <150)

## 2017-03-19 ENCOUNTER — Ambulatory Visit: Payer: Medicare Other

## 2017-03-19 ENCOUNTER — Ambulatory Visit (HOSPITAL_BASED_OUTPATIENT_CLINIC_OR_DEPARTMENT_OTHER)
Admission: RE | Admit: 2017-03-19 | Discharge: 2017-03-19 | Disposition: A | Discharge status: Home / Self Care | Payer: Medicare Other | Source: Ambulatory Visit | Attending: Cardiology | Admitting: Cardiology

## 2017-03-19 DIAGNOSIS — I10 Essential (primary) hypertension: Secondary | ICD-10-CM | POA: Diagnosis not present

## 2017-03-19 DIAGNOSIS — I081 Rheumatic disorders of both mitral and tricuspid valves: Secondary | ICD-10-CM | POA: Insufficient documentation

## 2017-03-19 DIAGNOSIS — R9431 Abnormal electrocardiogram [ECG] [EKG]: Secondary | ICD-10-CM | POA: Diagnosis not present

## 2017-03-19 DIAGNOSIS — E785 Hyperlipidemia, unspecified: Secondary | ICD-10-CM | POA: Diagnosis not present

## 2017-03-19 DIAGNOSIS — R0600 Dyspnea, unspecified: Secondary | ICD-10-CM | POA: Diagnosis not present

## 2017-03-19 NOTE — Progress Notes (Signed)
  Echocardiogram 2D Echocardiogram has been performed.  [name] 03/19/2017, 9:08 AM

## 2017-03-27 ENCOUNTER — Encounter: Payer: Self-pay | Admitting: Neurology

## 2017-03-27 ENCOUNTER — Ambulatory Visit (INDEPENDENT_AMBULATORY_CARE_PROVIDER_SITE_OTHER): Payer: Medicare Other | Admitting: Neurology

## 2017-03-27 VITALS — BP 138/82 | HR 56 | Ht 68.0 in | Wt 142.0 lb

## 2017-03-27 DIAGNOSIS — F03A0 Unspecified dementia, mild, without behavioral disturbance, psychotic disturbance, mood disturbance, and anxiety: Secondary | ICD-10-CM

## 2017-03-27 DIAGNOSIS — F0390 Unspecified dementia without behavioral disturbance: Secondary | ICD-10-CM

## 2017-03-27 MED ORDER — RIVASTIGMINE TARTRATE 1.5 MG PO CAPS: 1.5000 mg | ORAL_CAPSULE | Freq: Two times a day (BID) | ORAL | 1 refills | Status: DC
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
## 2017-03-27 NOTE — Patient Instructions (Addendum)
1. Start Exelon 1.5mg  twice a day. Call us in a month, if no problems, we will send prescription to [facility] 2. Follow-up in 6 months, call for   FALL PRECAUTIONS: Be cautious when walking. Scan the area for obstacles that may increase the risk of trips and falls. When getting up in the mornings, sit up at the edge of the bed for a few minutes before getting out of bed. Consider elevating the bed at the head end to avoid drop of blood pressure when getting up. Walk always in a well-lit room (use night lights in the walls). Avoid area rugs or power cords from appliances in the middle of the walkways. Use a walker or a cane if necessary and consider physical therapy for balance exercise. Get your eyesight checked regularly.  FINANCIAL OVERSIGHT: Supervision, especially oversight when making financial decisions or transactions is also recommended.  HOME SAFETY: Consider the safety of the kitchen when operating appliances like stoves, microwave oven, and blender. Consider having supervision and share cooking responsibilities until no longer able to participate in those. Accidents with firearms and other hazards in the house should be identified and addressed as well.  DRIVING: Regarding driving, in patients with progressive memory problems, driving will be impaired. We advise to have someone else do the driving if trouble finding directions or if minor accidents are reported. Independent driving assessment is available to determine safety of driving.  ABILITY TO BE LEFT ALONE: If patient is unable to contact 911 operator, consider using LifeLine, or when the need is there, arrange for someone to stay with patients. Smoking is a fire hazard, consider supervision or cessation. Risk of wandering should be assessed by caregiver and if detected at any point, supervision and safe proof recommendations should be instituted.  MEDICATION SUPERVISION: Inability to self-administer medication needs to be constantly  addressed. Implement a mechanism to ensure safe administration of the medications.  RECOMMENDATIONS FOR ALL PATIENTS WITH MEMORY PROBLEMS: 1. Continue to exercise (Recommend 30 minutes of walking everyday, or 3 hours every week) 2. Increase social interactions - continue going to [place] and enjoy social gatherings with friends and family 3. Eat healthy, avoid fried foods and eat more fruits and vegetables 4. Maintain adequate blood pressure, blood sugar, and blood cholesterol level. Reducing the risk of stroke and cardiovascular disease also helps promoting better memory. 5. Avoid stressful situations. Live a simple life and avoid aggravations. Organize your time and prepare for the next day in anticipation. 6. Sleep well, avoid any interruptions of sleep and avoid any distractions in the bedroom that may interfere with adequate sleep quality 7. Avoid sugar, avoid sweets as there is a strong link between excessive sugar intake, diabetes, and cognitive impairment We discussed the Mediterranean diet, which has been shown to help patients reduce the risk of progressive memory disorders and reduces cardiovascular risk. This includes eating fish, eat fruits and green leafy vegetables, nuts like almonds and hazelnuts, walnuts, and also use olive oil. Avoid fast foods and fried foods as much as possible. Avoid sweets and sugar as sugar use has been linked to worsening of memory function.  There is always a concern of gradual progression of memory problems. If this is the case, then we may need to adjust level of care according to patient needs. Support, both to the patient and caregiver, should then be put into place.

## 2017-04-10 ENCOUNTER — Encounter: Payer: Self-pay | Admitting: Cardiology

## 2017-04-10 ENCOUNTER — Ambulatory Visit (INDEPENDENT_AMBULATORY_CARE_PROVIDER_SITE_OTHER): Payer: Medicare Other | Admitting: Cardiology

## 2017-04-10 VITALS — BP 110/70 | HR 64 | Resp 10 | Ht 66.0 in | Wt 146.0 lb

## 2017-04-10 DIAGNOSIS — E785 Hyperlipidemia, unspecified: Secondary | ICD-10-CM

## 2017-04-10 DIAGNOSIS — G3184 Mild cognitive impairment, so stated: Secondary | ICD-10-CM

## 2017-04-10 DIAGNOSIS — H401190 Primary open-angle glaucoma, unspecified eye, stage unspecified: Secondary | ICD-10-CM

## 2017-04-10 DIAGNOSIS — I10 Essential (primary) hypertension: Secondary | ICD-10-CM

## 2017-04-10 DIAGNOSIS — I4510 Unspecified right bundle-branch block: Secondary | ICD-10-CM

## 2017-04-10 NOTE — Patient Instructions (Signed)
Medication Instructions:  Your physician recommends that you continue on your current medications as directed. Please refer to the Current Medication list given to you today.  1. Avoid all over-the-counter antihistamines except Claritin/Loratadine and Zyrtec/Cetrizine. 2. Avoid all combination including cold sinus allergies flu decongestant and sleep medications 3. You can use Robitussin DM Mucinex and Mucinex DM for cough. 4. can use Tylenol aspirin ibuprofen and naproxen but no combinations such as sleep or sinus.  Labwork: None   Testing/Procedures: None   Follow-Up: Your physician wants you to follow-up in: 5 months. You will receive a reminder letter in the mail two months in advance. If you don't receive a letter, please call our office to schedule the follow-up appointment.  Any Other Special Instructions Will Be Listed Below (If Applicable).  Please note that any paperwork needing to be filled out by the provider will need to be addressed at the front desk prior to seeing the provider. Please note that any paperwork FMLA, Disability or other documents regarding health condition is subject to a $25.00 charge that must be received prior to completion of paperwork in the form of a money order or check.     If you need a refill on your cardiac medications before your next appointment, please call your pharmacy.

## 2017-04-10 NOTE — Progress Notes (Signed)
Cardiology Office Note:    Date:  04/10/2017   ID:  [name], DOB Nov 01, 1925, MRN [id number]  PCP:  Gildardo Cranker, DO  Cardiologist:  Jenne Campus, MD    Referring MD: Gildardo Cranker, DO   Chief Complaint  Patient presents with  . 1 month follow up  Doing well  History of Present Illness:    [name] is a 83 y.o. male with abnormal EKG.  Quite extensive evaluation has been done which included Holter monitor which showed no significant abnormality he did have some PVCs and APCs and very short nonsustained asymptomatic supraventricular tachycardia which I do not think required any medication.  His echocardiogram showed no significant finding overall investigation has been negative.  He is doing well he still fairly active he does have silver sneakers and he is planning to start going to the gym on a regular basis especially during the wintertime when he cannot go outside because of cold weather I told him that from heart standpoint of view he is doing well I will see him back in about 5-6 months however I warned them if he develop dizziness passing out shortness of breath chest pain he need to let me know  Past Medical History:  Diagnosis Date  . BPH (benign prostatic hypertrophy)   . Cataract    Both  . Diverticula of colon   . Glaucoma 1986   Dr. Mila Merry  . Hearing loss   . Hx of adenomatous colonic polyps   . Internal hemorrhoids   . Left varicocele   . Obesity   . Pseudodementia   . RBBB (right bundle branch block)     Past Surgical History:  Procedure Laterality Date  . CATARACT EXTRACTION Right   . GLAUCOMA REPAIR Left     Current Medications: Current Meds  Medication Sig  . aspirin EC 81 MG tablet Take 81 mg by mouth daily.  . brimonidine (ALPHAGAN) 0.2 % ophthalmic solution Place 1 drop into the left eye Twice daily.   . clotrimazole (LOTRIMIN) 1 % cream Apply 1 application topically 2 (two) times daily as needed.  . famotidine (PEPCID)  20 MG tablet Take 20 mg by mouth at bedtime as needed for heartburn or indigestion.  . montelukast (SINGULAIR) 5 MG chewable tablet Chew 1 tablet (5 mg total) by mouth at bedtime.  . Multiple Vitamin (MULTIVITAMIN) tablet Take 1 tablet by mouth daily.  . Naproxen Sodium 220 MG CAPS Take 1 capsule by mouth daily as needed.  . rivastigmine (EXELON) 1.5 MG capsule Take 1 capsule (1.5 mg total) by mouth 2 (two) times daily.  . timolol (TIMOPTIC) 0.5 % ophthalmic solution Place 1 drop into the left eye daily.      Allergies:   Patient has no known allergies.   Social History   Social History  . Marital status: Married    Spouse name: N/A  . Number of children: N/A  . Years of education: N/A   Social History Main Topics  . Smoking status: Former Smoker    Packs/day: 0.30    Years: 10.00    Types: Cigarettes    Quit date: 06/12/1956  . Smokeless tobacco: Never Used  . Alcohol use No  . Drug use: No  . Sexual activity: No   Other Topics Concern  . None   Social History Narrative   Social History      Diet?       Do you drink/eat things with caffeine? Coffee,  tea      Marital status?           married                         What year were you married? 1957      Do you live in a house, apartment, assisted living, condo, trailer, etc.? house      Is it one or more stories? one      How many persons live in your home? 2      Do you have any pets in your home? (please list) no      Highest level of education completed? High school      Current or past profession: Camera operator      Do you exercise?           yes                           Type & how often? Walking everyday weather permitting      Do you have a living will? yes      Do you have a DNR form?      no                            If not, do you want to discuss one?      Do you have signed POA/HPOA for forms? yes      Functional Status      Do you have difficulty bathing or dressing  yourself? no      Do you have difficulty preparing food or eating? no      Do you have difficulty managing your medications? no      Do you have difficulty managing your finances? yes      Do you have difficulty affording your medications? no        Family History: The patient's family history includes Cancer in his mother; Heart disease (age of onset: 56) in his father; Parkinson's disease in his father. There is no history of Colon cancer. ROS:   Please see the history of present illness.    All 14 point review of systems negative except as described per history of present illness  EKGs/Labs/Other Studies Reviewed:      Recent Labs: 01/30/2017: ALT 13; BUN 22; Creat 1.34; Hemoglobin 12.2; Platelets 190; Potassium 4.6; Sodium 142; TSH 1.90  Recent Lipid Panel    Component Value Date/Time   CHOL 186 03/16/2017 0924   TRIG 49 03/16/2017 0924   TRIG 61 05/11/2006 1035   HDL 76 03/16/2017 0924   CHOLHDL 2.4 03/16/2017 0924   VLDL 12.6 09/15/2015 1359   LDLCALC 122 (H) 09/15/2015 1359   LDLDIRECT 106.9 12/26/2011 0946    Physical Exam:    VS:  BP 110/70   Pulse 64   Resp 10   Ht 5\' 6"  (1.676 m)   Wt 146 lb (66.2 kg)   BMI 23.57 kg/m     Wt Readings from Last 3 Encounters:  04/10/17 146 lb (66.2 kg)  03/27/17 142 lb (64.4 kg)  03/08/17 146 lb (66.2 kg)     GEN:  Well nourished, well developed in no acute distress HEENT: Normal NECK: No JVD; No carotid bruits LYMPHATICS: No lymphadenopathy CARDIAC: RRR, no murmurs, no rubs, no gallops RESPIRATORY:  Clear  to auscultation without rales, wheezing or rhonchi  ABDOMEN: Soft, non-tender, non-distended MUSCULOSKELETAL:  No edema; No deformity  SKIN: Warm and dry LOWER EXTREMITIES: no swelling NEUROLOGIC:  Alert and oriented x 3 PSYCHIATRIC:  Normal affect   ASSESSMENT:    1. BUNDLE BRANCH BLOCK, RIGHT   2. Essential hypertension   3. Mild cognitive impairment   4. Primary open angle glaucoma, unspecified  glaucoma stage, unspecified laterality   5. Hyperlipidemia, unspecified hyperlipidemia type    PLAN:    In order of problems listed above:  1. Right bundle branch block: Incidental finding so far investigation negative. 2. Essential hypertension: Blood pressure well controlled continue present management. 3. Mild cognitive impairment: He is a delightful gentleman seems to be stable.  Tomorrow is his birthday 4. For glaucoma followed by ophthalmology. 5. Dyslipidemia stable.   Medication Adjustments/Labs and Tests Ordered: Current medicines are reviewed at length with the patient today.  Concerns regarding medicines are outlined above.  No orders of the defined types were placed in this encounter.  Medication changes: No orders of the defined types were placed in this encounter.   Signed, Park Liter, MD, [facility] 04/10/2017 10:04 AM    [place]

## 2017-04-28 DIAGNOSIS — I6789 Other cerebrovascular disease: Secondary | ICD-10-CM | POA: Diagnosis not present

## 2017-04-28 DIAGNOSIS — R531 Weakness: Secondary | ICD-10-CM | POA: Diagnosis not present

## 2017-05-10 ENCOUNTER — Other Ambulatory Visit: Payer: Self-pay | Admitting: Internal Medicine

## 2017-05-10 DIAGNOSIS — J302 Other seasonal allergic rhinitis: Secondary | ICD-10-CM

## 2017-05-22 ENCOUNTER — Ambulatory Visit: Payer: Medicare Other | Admitting: Internal Medicine

## 2017-05-28 ENCOUNTER — Other Ambulatory Visit: Payer: Self-pay | Admitting: Neurology

## 2017-05-28 DIAGNOSIS — F03A0 Unspecified dementia, mild, without behavioral disturbance, psychotic disturbance, mood disturbance, and anxiety: Secondary | ICD-10-CM

## 2017-05-28 DIAGNOSIS — F0390 Unspecified dementia without behavioral disturbance: Secondary | ICD-10-CM

## 2017-06-08 ENCOUNTER — Ambulatory Visit: Payer: Medicare Other | Admitting: Internal Medicine

## 2017-06-22 ENCOUNTER — Encounter: Payer: Self-pay | Admitting: Internal Medicine

## 2017-06-22 ENCOUNTER — Ambulatory Visit: Payer: Medicare Other | Admitting: Internal Medicine

## 2017-06-22 VITALS — BP 118/64 | HR 60 | Temp 97.5°F | Resp 16 | Ht 66.0 in | Wt 143.2 lb

## 2017-06-22 DIAGNOSIS — R634 Abnormal weight loss: Secondary | ICD-10-CM | POA: Diagnosis not present

## 2017-06-22 DIAGNOSIS — N183 Chronic kidney disease, stage 3 unspecified: Secondary | ICD-10-CM

## 2017-06-22 DIAGNOSIS — I10 Essential (primary) hypertension: Secondary | ICD-10-CM | POA: Diagnosis not present

## 2017-06-22 DIAGNOSIS — R748 Abnormal levels of other serum enzymes: Secondary | ICD-10-CM

## 2017-06-22 DIAGNOSIS — R413 Other amnesia: Secondary | ICD-10-CM

## 2017-06-22 DIAGNOSIS — R1011 Right upper quadrant pain: Secondary | ICD-10-CM

## 2017-06-22 DIAGNOSIS — E7849 Other hyperlipidemia: Secondary | ICD-10-CM

## 2017-06-22 LAB — COMPLETE METABOLIC PANEL WITH GFR
AG Ratio: 1.3 (calc) (ref 1.0–2.5)
ALT: 12 U/L (ref 9–46)
AST: 20 U/L (ref 10–35)
Albumin: 3.9 g/dL (ref 3.6–5.1)
Alkaline phosphatase (APISO): 185 U/L — ABNORMAL HIGH (ref 40–115)
BUN/Creatinine Ratio: 13 (calc) (ref 6–22)
BUN: 16 mg/dL (ref 7–25)
CO2: 31 mmol/L (ref 20–32)
Calcium: 9.3 mg/dL (ref 8.6–10.3)
Chloride: 103 mmol/L (ref 98–110)
Creat: 1.25 mg/dL — ABNORMAL HIGH (ref 0.70–1.11)
GFR, Est African American: 59 mL/min/{1.73_m2} — ABNORMAL LOW (ref >=60)
GFR, Est Non African American: 51 mL/min/{1.73_m2} — ABNORMAL LOW (ref >=60)
Globulin: 3 g/dL (calc) (ref 1.9–3.7)
Glucose, Bld: 88 mg/dL (ref 65–139)
Potassium: 4.6 mmol/L (ref 3.5–5.3)
Sodium: 140 mmol/L (ref 135–146)
Total Bilirubin: 0.5 mg/dL (ref 0.2–1.2)
Total Protein: 6.9 g/dL (ref 6.1–8.1)

## 2017-06-22 LAB — CBC WITH DIFFERENTIAL/PLATELET
Basophils Absolute: 28 cells/uL (ref 0–200)
Basophils Relative: 0.6 %
Eosinophils Absolute: 138 cells/uL (ref 15–500)
Eosinophils Relative: 3 %
HCT: 36.9 % — ABNORMAL LOW (ref 38.5–50.0)
Hemoglobin: 12.4 g/dL — ABNORMAL LOW (ref 13.2–17.1)
Lymphs Abs: 1233 cells/uL (ref 850–3900)
MCH: 30.2 pg (ref 27.0–33.0)
MCHC: 33.6 g/dL (ref 32.0–36.0)
MCV: 90 fL (ref 80.0–100.0)
MPV: 10.4 fL (ref 7.5–12.5)
Monocytes Relative: 11.7 %
Neutro Abs: 2663 cells/uL (ref 1500–7800)
Neutrophils Relative %: 57.9 %
Platelets: 212 10*3/uL (ref 140–400)
RBC: 4.1 10*6/uL — ABNORMAL LOW (ref 4.20–5.80)
RDW: 12 % (ref 11.0–15.0)
Total Lymphocyte: 26.8 %
WBC mixed population: 538 cells/uL (ref 200–950)
WBC: 4.6 10*3/uL (ref 3.8–10.8)

## 2017-06-22 LAB — GAMMA GT: GGT: 38 U/L (ref 3–70)

## 2017-06-22 NOTE — Progress Notes (Signed)
Patient ID: [name], male   DOB: Feb 10, 1926, 84 y.o.   MRN: [id number]   Location:  [facility] OFFICE  Provider: DR Arletha Grippe  Code Status:  Goals of Care:  Advanced Directives 02/27/2017  Does Patient Have a Medical Advance Directive? Yes  Type of Advance Directive [place]  Does patient want to make changes to medical advance directive? No - Patient declined  Copy of [place] in Chart? Yes     Chief Complaint  Patient presents with  . Medical Management of Chronic Issues    3 month follow up. Patient stated that he has been having some pain on ribs on the right side x 1 week ago.   . Medication Refill    No refills needed at this time  . Health Maintenance    Patient is unsure if he needs the Pneumococcal vaccine.     HPI: Patient is a 84 y.o. male "[name]" seen today for medical management of chronic diseases.  He c/o RLQ pain x 3 mos intermittent. Occasionally reproducible. Pain worsens with bending forward. No N/V, change in urination, f/c. He fell in Nov 2018 within a few hours of receiving Shingrix. He collapsed and did hit his head. He was taken to the ED at [facility] and was dx with dehydration and syncope. CT head neg for acute process. Cr 1.64 (prev 1.34); alk phos 188; Hgb 12.6; Plts 140K (prev 190K). Weight down 3 lbs. Appetite excellent.  He is a poor historian due to cognitive impairment/memory loss. Hx obtained from spouse and daughter.   Seasonal allergic rhinitis - stable on singulair.   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 He has OD cats with IOL in past. He uses glaucoma gtts in OS (OD has bleb). Followed by ophthamology Dr Tora Kindred  Mild cognitive impairment - followed by Dr Delice Lesch. Tried aricept but it caused more sedation/wekaness. Stopped med and sx's improved. He remains active at home and community. No major impact on ADLs.  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 MMSE 23/30.  B12 deficiency - improved. He has rec'd B12 injections in the past. B12 level 507 (340 in 2016)  HTN - diet controlled. BP 118/64.  Chart reviewed. He will be celebrating 51 yrs of marriage on Jul 01, 2017 he is UTD on HM. Pneumovax given in 09/2000; prevnar 04/2015   Past Medical History:  Diagnosis Date  . BPH (benign prostatic hypertrophy)   . Cataract    Both  . Diverticula of colon   . Glaucoma 1986   Dr. Mila Merry  . Hearing loss   . Hx of adenomatous colonic polyps   . Internal hemorrhoids   . Left varicocele   . Obesity   . Pseudodementia   . RBBB (right bundle branch block)     Past Surgical History:  Procedure Laterality Date  . CATARACT EXTRACTION Right   . GLAUCOMA REPAIR Left      reports that he quit smoking about 61 years ago. His smoking use included cigarettes. He has a 3.00 pack-year smoking history. he has never used smokeless tobacco. He reports that he does not drink alcohol or use drugs. Social History   Socioeconomic History  . Marital status: Married    Spouse name: Not on file  . Number of children: Not on file  . Years of education: Not on file  . Highest education level:  Not on file  Social Needs  . Financial resource strain: Not on file  . Food insecurity - worry: Not on file  . Food insecurity - inability: Not on file  . Transportation needs - medical: Not on file  . Transportation needs - non-medical: Not on file  Occupational History  . Not on file  Tobacco Use  . Smoking status: Former Smoker    Packs/day: 0.30    Years: 10.00    Pack years: 3.00    Types: Cigarettes    Last attempt to quit: 06/12/1956    Years since quitting: 61.0  . Smokeless tobacco: Never Used  Substance and Sexual Activity  . Alcohol use: No  . Drug use: No  . Sexual activity: No  Other Topics Concern  . Not on file  Social History Narrative   Social History      Diet?       Do you drink/eat things with  caffeine? Coffee, tea      Marital status?           married                         What year were you married? 1957      Do you live in a house, apartment, assisted living, condo, trailer, etc.? house      Is it one or more stories? one      How many persons live in your home? 2      Do you have any pets in your home? (please list) no      Highest level of education completed? High school      Current or past profession: Camera operator      Do you exercise?           yes                           Type & how often? Walking everyday weather permitting      Do you have a living will? yes      Do you have a DNR form?      no                            If not, do you want to discuss one?      Do you have signed POA/HPOA for forms? yes      Functional Status      Do you have difficulty bathing or dressing yourself? no      Do you have difficulty preparing food or eating? no      Do you have difficulty managing your medications? no      Do you have difficulty managing your finances? yes      Do you have difficulty affording your medications? no    Family History  Problem Relation Age of Onset  . Heart disease Father 3  . Parkinson's disease Father   . Cancer Mother        ? type  . Colon cancer Neg Hx     No Known Allergies  Outpatient Encounter Medications as of 06/22/2017  Medication Sig  . aspirin EC 81 MG tablet Take 81 mg by mouth daily.  . brimonidine (ALPHAGAN) 0.2 % ophthalmic solution Place 1 drop into the left eye Twice daily.   Marland Kitchen  clotrimazole (LOTRIMIN) 1 % cream Apply 1 application topically 2 (two) times daily as needed.  . famotidine (PEPCID) 20 MG tablet Take 20 mg by mouth at bedtime as needed for heartburn or indigestion.  . montelukast (SINGULAIR) 5 MG chewable tablet CHEW 1 TABLET (5 MG TOTAL) BY MOUTH AT BEDTIME.  . Multiple Vitamin (MULTIVITAMIN) tablet Take 1 tablet by mouth daily.  . Naproxen Sodium 220 MG CAPS Take  1 capsule by mouth daily as needed.  . rivastigmine (EXELON) 1.5 MG capsule TAKE 1 CAPSULE BY MOUTH 2 TIMES DAILY.  Marland Kitchen timolol (TIMOPTIC) 0.5 % ophthalmic solution Place 1 drop into the left eye daily.    No facility-administered encounter medications on file as of 06/22/2017.     Review of Systems:  Review of Systems  Unable to perform ROS: Other (memory loss)    Health Maintenance  Topic Date Due  . PNA vac Low Risk Adult (2 of 2 - PPSV23) 04/27/2016  . TETANUS/TDAP  12/25/2021  . INFLUENZA VACCINE  Completed    Physical Exam: Vitals:   06/22/17 1008  BP: 118/64  Pulse: 60  Resp: 16  Temp: (!) 97.5 F (36.4 C)  TempSrc: Oral  SpO2: 96%  Weight: 143 lb 3.2 oz (65 kg)  Height: '5\' 6"'  (1.676 m)   Body mass index is 23.11 kg/m. Physical Exam  Constitutional: He appears well-developed and well-nourished.  HENT:  Mouth/Throat: Oropharynx is clear and moist.  MMM; no oral thrush  Eyes: Pupils are equal, round, and reactive to light. No scleral icterus.  OS corneal clouding  Neck: Neck supple. Carotid bruit is not present. No thyromegaly present.  Cardiovascular: Normal rate, regular rhythm, normal heart sounds and intact distal pulses. Exam reveals no gallop and no friction rub.  No murmur heard. No distal LE edema. No calf TTP  Pulmonary/Chest: Effort normal and breath sounds normal. He has no wheezes. He has no rales. He exhibits no tenderness.  Abdominal: Soft. Normal appearance and bowel sounds are normal. He exhibits no distension, no abdominal bruit, no pulsatile midline mass and no mass. There is no hepatomegaly. There is tenderness (RUQ). There is no rigidity, no rebound, no guarding and negative Murphy's sign. No hernia.  Musculoskeletal: He exhibits edema.  Lymphadenopathy:    He has no cervical adenopathy.  Neurological: He is alert.  Skin: Skin is warm and dry. No rash noted.  Psychiatric: He has a normal mood and affect. His behavior is normal. Judgment and  thought content normal.    Labs reviewed: Basic Metabolic Panel: Recent Labs    01/30/17 1032  NA 142  K 4.6  CL 104  CO2 24  GLUCOSE 82  BUN 22  CREATININE 1.34*  CALCIUM 9.5  TSH 1.90   Liver Function Tests: Recent Labs    01/30/17 1032  AST 23  ALT 13  ALKPHOS 181*  BILITOT 0.6  PROT 7.0  ALBUMIN 4.1   No results for input(s): LIPASE, AMYLASE in the last 8760 hours. No results for input(s): AMMONIA in the last 8760 hours. CBC: Recent Labs    01/30/17 1032  WBC 4.2  NEUTROABS 2,394  HGB 12.2*  HCT 37.2*  MCV 92.3  PLT 190   Lipid Panel: Recent Labs    03/16/17 0924  CHOL 186  HDL 76  TRIG 49  CHOLHDL 2.4   No results found for: HGBA1C  Procedures since last visit: No results found.  Assessment/Plan   ICD-10-CM   1. RUQ abdominal pain R10.11  US Abdomen Limited RUQ    CBC with Differential/Platelets  2. Weight loss R63.4 US Abdomen Limited RUQ    CMP with eGFR    CBC with Differential/Platelets  3. Memory impairment R41.3   4. Essential hypertension I10   5. Other hyperlipidemia E78.49   6. Stage 3 chronic kidney disease (HCC) N18.3   7. Elevated alkaline phosphatase level R74.8 Gamma GT   Will call with imaging results  Will call with lab results. May need AFP level  Continue current medications as ordered  Follow up in 3 mos for cognitive impairment, CKD, HTN, weight loss.     Rosina Cressler S. Perlie Gold  [facility] [address] [place], [place] [address] [phone] Cell (Monday-Friday 8 AM - 5 PM) [phone] After 5 PM and follow prompts

## 2017-06-22 NOTE — Patient Instructions (Addendum)
Will call with imaging results  Will call with lab results  Continue current medications as ordered  Follow up in 3 mos for cognitive impairment, CKD, HTN, weight loss.

## 2017-07-13 ENCOUNTER — Ambulatory Visit
Admission: RE | Admit: 2017-07-13 | Discharge: 2017-07-13 | Disposition: A | Discharge status: Home / Self Care | Payer: Medicare Other | Source: Ambulatory Visit | Attending: Internal Medicine | Admitting: Internal Medicine

## 2017-07-13 DIAGNOSIS — R634 Abnormal weight loss: Secondary | ICD-10-CM

## 2017-07-13 DIAGNOSIS — R1011 Right upper quadrant pain: Secondary | ICD-10-CM

## 2017-07-16 ENCOUNTER — Other Ambulatory Visit: Payer: Self-pay | Admitting: Internal Medicine

## 2017-07-16 DIAGNOSIS — R1011 Right upper quadrant pain: Secondary | ICD-10-CM

## 2017-07-16 DIAGNOSIS — R634 Abnormal weight loss: Secondary | ICD-10-CM

## 2017-07-16 DIAGNOSIS — K769 Liver disease, unspecified: Secondary | ICD-10-CM

## 2017-07-16 DIAGNOSIS — R748 Abnormal levels of other serum enzymes: Secondary | ICD-10-CM

## 2017-07-16 DIAGNOSIS — R160 Hepatomegaly, not elsewhere classified: Secondary | ICD-10-CM

## 2017-07-22 ENCOUNTER — Other Ambulatory Visit: Payer: Medicare Other

## 2017-07-22 ENCOUNTER — Ambulatory Visit
Admission: RE | Admit: 2017-07-22 | Discharge: 2017-07-22 | Disposition: A | Discharge status: Home / Self Care | Payer: Medicare Other | Source: Ambulatory Visit | Attending: Internal Medicine | Admitting: Internal Medicine

## 2017-07-22 DIAGNOSIS — R634 Abnormal weight loss: Secondary | ICD-10-CM

## 2017-07-22 DIAGNOSIS — R160 Hepatomegaly, not elsewhere classified: Secondary | ICD-10-CM

## 2017-07-22 DIAGNOSIS — R748 Abnormal levels of other serum enzymes: Secondary | ICD-10-CM

## 2017-07-22 DIAGNOSIS — K769 Liver disease, unspecified: Secondary | ICD-10-CM

## 2017-07-22 DIAGNOSIS — R1011 Right upper quadrant pain: Secondary | ICD-10-CM

## 2017-07-22 MED ORDER — GADOBENATE DIMEGLUMINE 529 MG/ML IV SOLN
13.0000 mL | Freq: Once | INTRAVENOUS | Status: AC | PRN
  Administered 2017-07-22: 13 mL via INTRAVENOUS

## 2017-07-23 ENCOUNTER — Telehealth: Payer: Self-pay

## 2017-07-23 ENCOUNTER — Encounter: Payer: Self-pay | Admitting: Physician Assistant

## 2017-07-23 DIAGNOSIS — R748 Abnormal levels of other serum enzymes: Secondary | ICD-10-CM

## 2017-07-23 DIAGNOSIS — K7689 Other specified diseases of liver: Secondary | ICD-10-CM

## 2017-07-23 NOTE — Telephone Encounter (Signed)
Large cyst in liver that is full of blood - recommend GI eval for further management options such as possible aspiration

## 2017-07-23 NOTE — Telephone Encounter (Signed)
Wife called back because patient overheard her talking about the results and pt got nervous and upset. Wife would like the first available appt with GI. Per wife they went to [facility] years ago and would like them if possible, but she will take 1st appt with anyone to give pt some relief.

## 2017-07-23 NOTE — Telephone Encounter (Signed)
Ciara with [facility] called with MRI- Liver call report.   Refer to completed report dated 07/22/17

## 2017-07-23 NOTE — Telephone Encounter (Signed)
Discussed results with patient's wife. [name] verbalized understanding of results.  [name] in agreement with GI referral. [name] has specifics when scheduling GI appointment (added to GI referral-comment section)   Referral order pending, please complete. (routed back to [place], [place], DO)

## 2017-08-02 ENCOUNTER — Encounter: Payer: Self-pay | Admitting: Physician Assistant

## 2017-08-02 ENCOUNTER — Ambulatory Visit: Payer: Medicare Other | Admitting: Physician Assistant

## 2017-08-02 VITALS — BP 132/78 | HR 68 | Ht 65.75 in | Wt 145.5 lb

## 2017-08-02 DIAGNOSIS — R1011 Right upper quadrant pain: Secondary | ICD-10-CM

## 2017-08-02 DIAGNOSIS — R748 Abnormal levels of other serum enzymes: Secondary | ICD-10-CM | POA: Diagnosis not present

## 2017-08-02 DIAGNOSIS — K7689 Other specified diseases of liver: Secondary | ICD-10-CM

## 2017-08-02 NOTE — Progress Notes (Signed)
Reviewed and agree with management plan.  Yusef Lamp T. Luc Shammas, MD FACG 

## 2017-08-02 NOTE — Progress Notes (Signed)
Chief Complaint: Liver cyst, elevated alk phos, right upper quadrant dicomfort  HPI:    [name] is an 84 year old African-American male with a past medical history as listed below, who previously followed with Dr. Maurene Capes, and was referred to me by Gildardo Cranker, DO for a complaint of liver cyst and elevated alk phos with right upper quadrant discomfort.      Recent CMP shows elevated alk phos 5.  GGT normal.  CBC normal.  Creatinine minimally elevated at 1.25.  Right upper quadrant ultrasound 07/13/17 revealed large complex cystic mass arising from the right lobe of the liver measuring 12 cm.  Etiology was unclear and further assessment recommended with a liver MRI.  Also small gallbladder polyps with no gallstones or acute cholecystitis and no bile duct dilation.  MRI liver with and without contrast 07/22/17 shows large hemorrhagic cyst within the right hepatic lobe (700 cc) without internal enhancement.  Favor hemorrhage into a benign cyst.  Normal biliary tree and normal pancreas.    Today, presents to clinic with his wife who does help with his history.  He was having a right upper quadrant discomfort which was bothering him intermittently throughout the day and was worse when he would bend over to pick up rocks out of his yard, but for the past 2 weeks this has not been bothering him "as much".  This is described as more of a discomfort and a "feeling" rather than a pain.  His wife notes that he has appeared more bloated as he "always had a flat stomach before".  Patient denies a change in bowel habits.    Chronic reflux for which he uses as needed Pepcid every couple of weeks.    Denies fever, chills, unintentional weight loss, symptoms that awaken him at night, nausea, vomiting, history of hepatitis, IV drug use or blood transfusion.  Past Medical History:  Diagnosis Date  . BPH (benign prostatic hypertrophy)   . Cataract    Both  . Diverticula of colon   . Gallbladder polyp   . Glaucoma  1986   Dr. Mila Merry  . Hearing loss   . Hx of adenomatous colonic polyps   . Internal hemorrhoids   . Left varicocele   . Liver cyst   . Obesity   . Pseudodementia   . RBBB (right bundle branch block)     Past Surgical History:  Procedure Laterality Date  . CATARACT EXTRACTION Right   . GLAUCOMA REPAIR Left     Current Outpatient Medications  Medication Sig Dispense Refill  . aspirin EC 81 MG tablet Take 81 mg by mouth daily.    . brimonidine (ALPHAGAN) 0.2 % ophthalmic solution Place 1 drop into the left eye Twice daily.     . clotrimazole (LOTRIMIN) 1 % cream Apply 1 application topically 2 (two) times daily as needed.    . famotidine (PEPCID) 20 MG tablet Take 20 mg by mouth at bedtime as needed for heartburn or indigestion.    . montelukast (SINGULAIR) 5 MG chewable tablet CHEW 1 TABLET (5 MG TOTAL) BY MOUTH AT BEDTIME. 30 tablet 2  . Multiple Vitamin (MULTIVITAMIN) tablet Take 1 tablet by mouth daily.    . Naproxen Sodium 220 MG CAPS Take 1 capsule by mouth daily as needed.    . timolol (TIMOPTIC) 0.5 % ophthalmic solution Place 1 drop into the left eye daily.      No current facility-administered medications for this visit.     Allergies as  of 08/02/2017  . (No Known Allergies)    Family History  Problem Relation Age of Onset  . Heart disease Father 74  . Parkinson's disease Father   . Cancer Mother        ? type  . Colon cancer Neg Hx     Social History   Socioeconomic History  . Marital status: Married    Spouse name: Not on file  . Number of children: 4  . Years of education: Not on file  . Highest education level: Not on file  Social Needs  . Financial resource strain: Not on file  . Food insecurity - worry: Not on file  . Food insecurity - inability: Not on file  . Transportation needs - medical: Not on file  . Transportation needs - non-medical: Not on file  Occupational History  . Not on file  Tobacco Use  . Smoking status: Former Smoker      Packs/day: 0.30    Years: 10.00    Pack years: 3.00    Types: Cigarettes    Last attempt to quit: 06/12/1956    Years since quitting: 61.1  . Smokeless tobacco: Never Used  Substance and Sexual Activity  . Alcohol use: No  . Drug use: No  . Sexual activity: No  Other Topics Concern  . Not on file  Social History Narrative   Social History      Diet?       Do you drink/eat things with caffeine? Coffee, tea      Marital status?           married                         What year were you married? 1957      Do you live in a house, apartment, assisted living, condo, trailer, etc.? house      Is it one or more stories? one      How many persons live in your home? 2      Do you have any pets in your home? (please list) no      Highest level of education completed? High school      Current or past profession: Camera operator      Do you exercise?           yes                           Type & how often? Walking everyday weather permitting      Do you have a living will? yes      Do you have a DNR form?      no                            If not, do you want to discuss one?      Do you have signed POA/HPOA for forms? yes      Functional Status      Do you have difficulty bathing or dressing yourself? no      Do you have difficulty preparing food or eating? no      Do you have difficulty managing your medications? no      Do you have difficulty managing your finances? yes      Do you have difficulty affording your medications? no  Review of Systems:    Constitutional: No weight loss, fever or chills Skin: No rash  Cardiovascular: No chest pain  Respiratory: No SOB  Gastrointestinal: See HPI and otherwise negative Genitourinary: No dysuria  Neurological: No headache, dizziness or syncope Musculoskeletal: No new muscle or joint pain Hematologic: No bleeding  Psychiatric: No history of depression or anxiety   Physical Exam:  Vital  signs: BP 132/78 (BP Location: Left Arm, Patient Position: Sitting, Cuff Size: Normal)   Pulse 68   Ht 5' 5.75" (1.67 m)   Wt 145 lb 8 oz (66 kg)   BMI 23.66 kg/m    Constitutional:   Pleasant Elderly AA male appears to be in NAD, Well developed, Well nourished, alert and cooperative Head:  Normocephalic and atraumatic. Eyes:   PEERL, EOMI. No icterus. Conjunctiva pink. Ears:  Normal auditory acuity. Neck:  Supple Throat: Oral cavity and pharynx without inflammation, swelling or lesion.  Respiratory: Respirations even and unlabored. Lungs clear to auscultation bilaterally.   No wheezes, crackles, or rhonchi.  Cardiovascular: Normal S1, S2. No MRG. Regular rate and rhythm. No peripheral edema, cyanosis or pallor.  Gastrointestinal:  Soft, nondistended, nontender. No rebound or guarding. Normal bowel sounds. No appreciable masses or hepatomegaly. Rectal:  Not performed.  Msk:  Symmetrical without gross deformities. Without edema, no deformity or joint abnormality.  Neurologic:  Alert and  oriented x4;  grossly normal neurologically.  Skin:   Dry and intact without significant lesions or rashes. Psychiatric:  Demonstrates good judgement and reason without abnormal affect or behaviors.  RELEVANT LABS AND IMAGING: CBC    Component Value Date/Time   WBC 4.6 06/22/2017 1144   RBC 4.10 (L) 06/22/2017 1144   HGB 12.4 (L) 06/22/2017 1144   HCT 36.9 (L) 06/22/2017 1144   PLT 212 06/22/2017 1144   MCV 90.0 06/22/2017 1144   MCH 30.2 06/22/2017 1144   MCHC 33.6 06/22/2017 1144   RDW 12.0 06/22/2017 1144   LYMPHSABS 1,233 06/22/2017 1144   MONOABS 378 01/30/2017 1032   EOSABS 138 06/22/2017 1144   BASOSABS 28 06/22/2017 1144    CMP     Component Value Date/Time   NA 140 06/22/2017 1144   K 4.6 06/22/2017 1144   CL 103 06/22/2017 1144   CO2 31 06/22/2017 1144   GLUCOSE 88 06/22/2017 1144   GLUCOSE 91 05/11/2006 1035   BUN 16 06/22/2017 1144   CREATININE 1.25 (H) 06/22/2017 1144     CALCIUM 9.3 06/22/2017 1144   PROT 6.9 06/22/2017 1144   ALBUMIN 4.1 01/30/2017 1032   AST 20 06/22/2017 1144   ALT 12 06/22/2017 1144   ALKPHOS 181 (H) 01/30/2017 1032   BILITOT 0.5 06/22/2017 1144   GFRNONAA 51 (L) 06/22/2017 1144   GFRAA 59 (L) 06/22/2017 1144   EXAM: ULTRASOUND ABDOMEN LIMITED RIGHT UPPER QUADRANT 07/13/17  COMPARISON:  None.  FINDINGS: Gallbladder:  No shadowing stones. No wall thickening or pericholecystic fluid. There several small polyps, largest measuring 3 mm.  Common bile duct:  Diameter: 2 mm  Liver:  Large complex cystic mass arises from the right lobe measuring 12 x 10 x 12 cm. There is a small, 7 mm, cystic lesion with a thin septation in the left lobe. No other discrete liver masses or lesions. Liver normal in overall parenchymal echogenicity and normal in overall size. Portal vein is patent on color Doppler imaging with normal direction of blood flow towards the liver.  IMPRESSION: 1. Large complex cystic mass arising from  the right lobe of the liver measuring 12 cm. The etiology is unclear and further assessment and characterization with liver MRI with and without contrast is recommended. If the patient cannot tolerate MRI, CT imaging of the liver using a hemangioma protocol would be recommended. 2. Small gallbladder polyps. No gallstones or acute cholecystitis. No bile duct dilation.   Electronically Signed   By: Lajean Manes M.D.   On: 07/13/2017 13:18  EXAM: MRI ABDOMEN WITHOUT AND WITH CONTRAST 07/22/17  TECHNIQUE: Multiplanar multisequence MR imaging of the abdomen was performed both before and after the administration of intravenous contrast.  CONTRAST:  26m MULTIHANCE GADOBENATE DIMEGLUMINE 529 MG/ML IV SOLN  COMPARISON:  Ultrasound 07/13/2008, CT report 11/01/1998  FINDINGS: Lower chest:  Lung bases are clear.  Hepatobiliary: Large well-circumscribed cystic lesion in the RIGHT hepatic lobe  measures 12.4 by 9.2 by 11.5 cm (volume = 690 cm^3). Lesion is hyperintense on T2 weighted imaging with internal complexity within lacy internal pattern. This mass lesion is homogeneously hyperintense on noncontrast T1 weighted imaging (series 10) consistent with internal protein or blood product.  Postcontrast subtracted imaging demonstrates no internal enhancement. No comparison is available other than the recent ultrasound and remote CT report (2000) which documents a 3.5 cm cyst.  No additional hepatic lesions. No intrahepatic duct dilatation. Gallbladder is normal. Common bile duct normal.  Pancreas: Normal pancreatic parenchymal intensity. No ductal dilatation or inflammation.  Spleen: Normal spleen.  Adrenals/urinary tract: Adrenal glands and kidneys are normal.  Stomach/Bowel: Stomach and limited of the small bowel is unremarkable  Vascular/Lymphatic: Abdominal aortic normal caliber. No retroperitoneal periportal lymphadenopathy.  Musculoskeletal: No aggressive osseous lesion  IMPRESSION: 1. Large hemorrhagic cyst within the RIGHT hepatic lobe (700 cc) without internal enhancement. Favor hemorrhage into a benign cyst. Recent ultrasound demonstrates more internal complexity than the MRI. If patient is symptomatic, aspiration may benefit. 2. Normal biliary tree. 3. Normal pancreas.  These results will be called to the ordering clinician or representative by the Radiologist Assistant, and communication documented in the PACS or zVision Dashboard.   Electronically Signed   By: SSuzy BouchardM.D.   On: 07/22/2017 15:35  Assessment: 1.  Large liver cyst: See imaging above, 12 x 9 x 11 cm; will refer to hepatology clinic for further recs 2.  Elevated alk phos: normal GGT 3.  Right upper quadrant discomfort  Gutierrez: 1.  Discussed case with Dr. SFuller Planwho recommended referring the patient to the liver clinic for further evaluation.  We will marked this  as urgent as patient is anxious. 2.  Answered questions today.  Provided reassurance. 3.  Patient to follow in our clinic as needed in the future with Dr. SFuller Planor myself.  Dave Newer PA-C [facility] 08/02/2017, 10:35 AM  Cc: CGildardo Cranker DO

## 2017-08-02 NOTE — Patient Instructions (Signed)
We will refer you to the Liver clinic to see Roosevelt Locks, NP.  [place], [place] [id number]

## 2017-08-06 ENCOUNTER — Encounter: Payer: Self-pay | Admitting: Internal Medicine

## 2017-08-07 ENCOUNTER — Encounter: Payer: Self-pay | Admitting: Internal Medicine

## 2017-08-09 LAB — HEPATIC FUNCTION PANEL
ALT: 11 (ref 10–40)
AST: 17 (ref 14–40)
Alkaline Phosphatase: 198 — AB (ref 25–125)
Bilirubin, Total: 0.7

## 2017-08-09 LAB — BASIC METABOLIC PANEL
Creatinine: 1.4 — AB (ref 0.6–1.3)
Sodium: 141 (ref 137–147)

## 2017-08-09 LAB — POCT INR: INR: 1 (ref 0.9–1.1)

## 2017-08-09 LAB — PROTIME-INR: Protime: 10.6 (ref 10.0–13.8)

## 2017-09-19 ENCOUNTER — Encounter: Payer: Self-pay | Admitting: Cardiology

## 2017-09-19 ENCOUNTER — Ambulatory Visit: Payer: Medicare Other | Admitting: Cardiology

## 2017-09-19 VITALS — BP 130/66 | HR 64 | Ht 66.0 in | Wt 144.0 lb

## 2017-09-19 DIAGNOSIS — I10 Essential (primary) hypertension: Secondary | ICD-10-CM | POA: Diagnosis not present

## 2017-09-19 DIAGNOSIS — I4510 Unspecified right bundle-branch block: Secondary | ICD-10-CM | POA: Diagnosis not present

## 2017-09-19 DIAGNOSIS — E781 Pure hyperglyceridemia: Secondary | ICD-10-CM | POA: Diagnosis not present

## 2017-09-19 DIAGNOSIS — R55 Syncope and collapse: Secondary | ICD-10-CM | POA: Diagnosis not present

## 2017-09-19 HISTORY — DX: Syncope and collapse: R55

## 2017-09-19 NOTE — Progress Notes (Signed)
Cardiology Office Note:    Date:  09/19/2017   ID:  [name], DOB 28-Mar-1926, MRN [id number]  PCP:  Gildardo Cranker, DO  Cardiologist:  Jenne Campus, MD    Referring MD: Gildardo Cranker, DO   Chief Complaint  Patient presents with  . Follow-up  Had episode of syncope  History of Present Illness:    [name] is a 84 y.o. male with right bundle branch block on EKG.  I saw him for that and last fall however months later he ended up getting shingles vaccine he went home at evening time he got out of the bath went to the restroom started feeling dizzy and eventually ended up passing out he struck his head.  He ended up going to the emergency room a lot of tests were done all were negative he was discharged home interestingly just few days ago his son was shaving him he was in the bathroom and temperature was about 68 F and he started feeling woozy and dizzy and again the similar situation happened.  Not having any palpitations.  He did not feel his heart doing anything strange.  Because of his bundle branch block I think we can benefit from long-term monitoring.  I will ask him to wear a long-term Holter monitor.  Past Medical History:  Diagnosis Date  . BPH (benign prostatic hypertrophy)   . Cataract    Both  . Diverticula of colon   . Gallbladder polyp   . Glaucoma 1986   Dr. Mila Merry  . Hearing loss   . Hx of adenomatous colonic polyps   . Internal hemorrhoids   . Left varicocele   . Liver cyst   . Obesity   . Pseudodementia   . RBBB (right bundle branch block)     Past Surgical History:  Procedure Laterality Date  . CATARACT EXTRACTION Right   . GLAUCOMA REPAIR Left     Current Medications: Current Meds  Medication Sig  . aspirin EC 81 MG tablet Take 81 mg by mouth daily.  . brimonidine (ALPHAGAN) 0.2 % ophthalmic solution Place 1 drop into the left eye Twice daily.   . clotrimazole (LOTRIMIN) 1 % cream Apply 1 application topically 2 (two) times  daily as needed.  . famotidine (PEPCID) 20 MG tablet Take 20 mg by mouth at bedtime as needed for heartburn or indigestion.  . montelukast (SINGULAIR) 5 MG chewable tablet CHEW 1 TABLET (5 MG TOTAL) BY MOUTH AT BEDTIME.  . Multiple Vitamin (MULTIVITAMIN) tablet Take 1 tablet by mouth daily.  . Naproxen Sodium 220 MG CAPS Take 1 capsule by mouth daily as needed.  . timolol (TIMOPTIC) 0.5 % ophthalmic solution Place 1 drop into the left eye daily.      Allergies:   Patient has no known allergies.   Social History   Socioeconomic History  . Marital status: Married    Spouse name: Not on file  . Number of children: 4  . Years of education: Not on file  . Highest education level: Not on file  Occupational History  . Not on file  Social Needs  . Financial resource strain: Not on file  . Food insecurity:    Worry: Not on file    Inability: Not on file  . Transportation needs:    Medical: Not on file    Non-medical: Not on file  Tobacco Use  . Smoking status: Former Smoker    Packs/day: 0.30    Years: 10.00  Pack years: 3.00    Types: Cigarettes    Last attempt to quit: 06/12/1956    Years since quitting: 61.3  . Smokeless tobacco: Never Used  Substance and Sexual Activity  . Alcohol use: No  . Drug use: No  . Sexual activity: Never  Lifestyle  . Physical activity:    Days per week: Not on file    Minutes per session: Not on file  . Stress: Not on file  Relationships  . Social connections:    Talks on phone: Not on file    Gets together: Not on file    Attends religious service: Not on file    Active member of club or organization: Not on file    Attends meetings of clubs or organizations: Not on file    Relationship status: Not on file  Other Topics Concern  . Not on file  Social History Narrative   Social History      Diet?       Do you drink/eat things with caffeine? Coffee, tea      Marital status?           married                         What year were you  married? 1957      Do you live in a house, apartment, assisted living, condo, trailer, etc.? house      Is it one or more stories? one      How many persons live in your home? 2      Do you have any pets in your home? (please list) no      Highest level of education completed? High school      Current or past profession: Camera operator      Do you exercise?           yes                           Type & how often? Walking everyday weather permitting      Do you have a living will? yes      Do you have a DNR form?      no                            If not, do you want to discuss one?      Do you have signed POA/HPOA for forms? yes      Functional Status      Do you have difficulty bathing or dressing yourself? no      Do you have difficulty preparing food or eating? no      Do you have difficulty managing your medications? no      Do you have difficulty managing your finances? yes      Do you have difficulty affording your medications? no     Family History: The patient's family history includes Cancer in his mother; Heart disease (age of onset: 58) in his father; Parkinson's disease in his father. There is no history of Colon cancer. ROS:   Please see the history of present illness.    All 14 point review of systems negative except as described per history of present illness  EKGs/Labs/Other Studies Reviewed:      Recent Labs: 01/30/2017: TSH 1.90  06/22/2017: BUN 16; Hemoglobin 12.4; Platelets 212; Potassium 4.6 08/09/2017: ALT 11; Creatinine 1.4; Sodium 141  Recent Lipid Panel    Component Value Date/Time   CHOL 186 03/16/2017 0924   TRIG 49 03/16/2017 0924   TRIG 61 05/11/2006 1035   HDL 76 03/16/2017 0924   CHOLHDL 2.4 03/16/2017 0924   VLDL 12.6 09/15/2015 1359   LDLCALC 96 03/16/2017 0924   LDLDIRECT 106.9 12/26/2011 0946    Physical Exam:    VS:  BP 130/66   Pulse 64   Ht 5\' 6"  (1.676 m)   Wt 144 lb (65.3 kg)   SpO2 94%    BMI 23.24 kg/m     Wt Readings from Last 3 Encounters:  09/19/17 144 lb (65.3 kg)  08/02/17 145 lb 8 oz (66 kg)  06/22/17 143 lb 3.2 oz (65 kg)     GEN:  Well nourished, well developed in no acute distress HEENT: Normal NECK: No JVD; No carotid bruits LYMPHATICS: No lymphadenopathy CARDIAC: RRR, no murmurs, no rubs, no gallops RESPIRATORY:  Clear to auscultation without rales, wheezing or rhonchi  ABDOMEN: Soft, non-tender, non-distended MUSCULOSKELETAL:  No edema; No deformity  SKIN: Warm and dry LOWER EXTREMITIES: no swelling NEUROLOGIC:  Alert and oriented x 3 PSYCHIATRIC:  Normal affect   ASSESSMENT:    1. BUNDLE BRANCH BLOCK, RIGHT   2. Essential hypertension   3. Pure hyperglyceridemia   4. Syncope, unspecified syncope type    PLAN:    In order of problems listed above:  1. Bundle branch block with episode of syncope.  We will schedule him to have long-term Holter monitor for 3 weeks. 2. Essential hypertension: Blood pressure appears to be slightly higher according to his wife but I told her it is better to keep his blood pressure slightly elevated rather than lower this too much and cause orthostatic hypotension. 3. Dyslipidemia: Followed by primary care physician   Medication Adjustments/Labs and Tests Ordered: Current medicines are reviewed at length with the patient today.  Concerns regarding medicines are outlined above.  No orders of the defined types were placed in this encounter.  Medication changes: No orders of the defined types were placed in this encounter.   Signed, Park Liter, MD, [facility] 09/19/2017 11:35 AM    [place]

## 2017-09-19 NOTE — Patient Instructions (Addendum)
Medication Instructions:  Your physician recommends that you continue on your current medications as directed. Please refer to the Current Medication list given to you today.   Labwork: None  Testing/Procedures: Your physician has recommended that you wear a holter monitor. Holter monitors are medical devices that record the heart's electrical activity. Doctors most often use these monitors to diagnose arrhythmias. Arrhythmias are problems with the speed or rhythm of the heartbeat. The monitor is a small, portable device. You can wear one while you do your normal daily activities. This is usually used to diagnose what is causing palpitations/syncope (passing out). Wear for 2 weeks.   Follow-Up: Your physician wants you to follow-up in: 3 months. You will receive a reminder letter in the mail two months in advance. If you don't receive a letter, please call our office to schedule the follow-up appointment.   Any Other Special Instructions Will Be Listed Below (If Applicable).     If you need a refill on your cardiac medications before your next appointment, please call your pharmacy.

## 2017-09-21 ENCOUNTER — Encounter: Payer: Self-pay | Admitting: Internal Medicine

## 2017-09-21 ENCOUNTER — Ambulatory Visit: Payer: Medicare Other

## 2017-09-21 ENCOUNTER — Ambulatory Visit: Payer: Medicare Other | Admitting: Internal Medicine

## 2017-09-21 VITALS — BP 128/76 | HR 61 | Temp 97.4°F | Ht 66.0 in | Wt 141.0 lb

## 2017-09-21 DIAGNOSIS — F0390 Unspecified dementia without behavioral disturbance: Secondary | ICD-10-CM

## 2017-09-21 DIAGNOSIS — K7689 Other specified diseases of liver: Secondary | ICD-10-CM

## 2017-09-21 DIAGNOSIS — N183 Chronic kidney disease, stage 3 unspecified: Secondary | ICD-10-CM

## 2017-09-21 DIAGNOSIS — I4510 Unspecified right bundle-branch block: Secondary | ICD-10-CM

## 2017-09-21 DIAGNOSIS — F03A0 Unspecified dementia, mild, without behavioral disturbance, psychotic disturbance, mood disturbance, and anxiety: Secondary | ICD-10-CM

## 2017-09-21 DIAGNOSIS — I10 Essential (primary) hypertension: Secondary | ICD-10-CM | POA: Diagnosis not present

## 2017-09-21 HISTORY — DX: Chronic kidney disease, stage 3 unspecified: N18.30

## 2017-09-21 NOTE — Progress Notes (Signed)
Patient ID: [name], male   DOB: 01-20-1926, 84 y.o.   MRN: [id number]   Location:  [facility] OFFICE  Provider: DR Arletha Grippe  Code Status:  Goals of Care:  Advanced Directives 09/21/2017  Does Patient Have a Medical Advance Directive? Yes  Type of Advance Directive [place]  Does patient want to make changes to medical advance directive? No - Patient declined  Copy of [place] in Chart? -     Chief Complaint  Patient presents with  . Medical Management of Chronic Issues    3 month follow-up on congnitive impairment, CKD, HTN, and weight loss, + fall risk   . Medication Refill    No refills needed   . Advance Care Planning    HCPOA    HPI: Patient is a 84 y.o. male seen today for medical management of chronic diseases.  He saw the liver clinic and imaging revealed cyst but drainage not recommended. MRI revealed large right hepatic lobe hemorrhagic cyst (12.4 x 9.2 x 11.5 cm); per liver clinic, low likelihood of malignancy and will need f/u MRI in 3 mos.  He saw cardio and recommended cardiac monitor x 3 weeks to further investigate BBB with syncope.  Seasonal allergic rhinitis - stable on singulair.  CKD - stage 3. Cr 1.4   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 He has OD cats with IOL in past. He uses glaucoma gtts in OS (OD has bleb). Followed by ophthamology Dr Tora Kindred  Mild cognitive impairment/dementia - unchanged. followed by Dr Delice Lesch. Tried aricept but it caused more sedation/wekaness. Stopped med and sx's improved. He remains active at home and community. No major impact on ADLs.  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 MMSE 23/30.  B12 deficiency - improved. He has rec'd B12 injections in the past. B12 level 507 (340 in 2016)  HTN - diet controlled. BP 118/64.  Chart reviewed. He will be celebrating 41 yrs of marriage on Jul 01, 2017 he is UTD on HM.  Pneumovax given in 09/2000; prevnar 04/2015   Past Medical History:  Diagnosis Date  . BPH (benign prostatic hypertrophy)   . Cataract    Both  . Diverticula of colon   . Gallbladder polyp   . Glaucoma 1986   Dr. Mila Merry  . Hearing loss   . Hx of adenomatous colonic polyps   . Internal hemorrhoids   . Left varicocele   . Liver cyst   . Obesity   . Pseudodementia   . RBBB (right bundle branch block)     Past Surgical History:  Procedure Laterality Date  . CATARACT EXTRACTION Right   . GLAUCOMA REPAIR Left      reports that he quit smoking about 61 years ago. His smoking use included cigarettes. He has a 3.00 pack-year smoking history. He has never used smokeless tobacco. He reports that he does not drink alcohol or use drugs. Social History   Socioeconomic History  . Marital status: Married    Spouse name: Not on file  . Number of children: 4  . Years of education: Not on file  . Highest education level: Not on file  Occupational History  . Not on file  Social Needs  . Financial resource strain: Not on file  . Food insecurity:    Worry: Not on file    Inability: Not on file  . Transportation needs:  Medical: Not on file    Non-medical: Not on file  Tobacco Use  . Smoking status: Former Smoker    Packs/day: 0.30    Years: 10.00    Pack years: 3.00    Types: Cigarettes    Last attempt to quit: 06/12/1956    Years since quitting: 61.3  . Smokeless tobacco: Never Used  Substance and Sexual Activity  . Alcohol use: No  . Drug use: No  . Sexual activity: Never  Lifestyle  . Physical activity:    Days per week: Not on file    Minutes per session: Not on file  . Stress: Not on file  Relationships  . Social connections:    Talks on phone: Not on file    Gets together: Not on file    Attends religious service: Not on file    Active member of club or organization: Not on file    Attends meetings of clubs or organizations: Not on file    Relationship  status: Not on file  . Intimate partner violence:    Fear of current or ex partner: Not on file    Emotionally abused: Not on file    Physically abused: Not on file    Forced sexual activity: Not on file  Other Topics Concern  . Not on file  Social History Narrative   Social History      Diet?       Do you drink/eat things with caffeine? Coffee, tea      Marital status?           married                         What year were you married? 1957      Do you live in a house, apartment, assisted living, condo, trailer, etc.? house      Is it one or more stories? one      How many persons live in your home? 2      Do you have any pets in your home? (please list) no      Highest level of education completed? High school      Current or past profession: Camera operator      Do you exercise?           yes                           Type & how often? Walking everyday weather permitting      Do you have a living will? yes      Do you have a DNR form?      no                            If not, do you want to discuss one?      Do you have signed POA/HPOA for forms? yes      Functional Status      Do you have difficulty bathing or dressing yourself? no      Do you have difficulty preparing food or eating? no      Do you have difficulty managing your medications? no      Do you have difficulty managing your finances? yes      Do you have difficulty affording your medications? no  Family History  Problem Relation Age of Onset  . Heart disease Father 27  . Parkinson's disease Father   . Cancer Mother        ? type  . Colon cancer Neg Hx     No Known Allergies  Outpatient Encounter Medications as of 09/21/2017  Medication Sig  . aspirin EC 81 MG tablet Take 81 mg by mouth daily.  . brimonidine (ALPHAGAN) 0.2 % ophthalmic solution Place 1 drop into the left eye Twice daily.   . clotrimazole (LOTRIMIN) 1 % cream Apply 1 application topically 2  (two) times daily as needed.  . famotidine (PEPCID) 20 MG tablet Take 20 mg by mouth at bedtime as needed for heartburn or indigestion.  . montelukast (SINGULAIR) 5 MG chewable tablet CHEW 1 TABLET (5 MG TOTAL) BY MOUTH AT BEDTIME.  . Multiple Vitamin (MULTIVITAMIN) tablet Take 1 tablet by mouth daily.  . Naproxen Sodium 220 MG CAPS Take 1 capsule by mouth daily as needed.  . timolol (TIMOPTIC) 0.5 % ophthalmic solution Place 1 drop into the left eye daily.    No facility-administered encounter medications on file as of 09/21/2017.     Review of Systems:  Review of Systems  Unable to perform ROS: Dementia    Health Maintenance  Topic Date Due  . INFLUENZA VACCINE  01/10/2018  . TETANUS/TDAP  12/25/2021  . PNA vac Low Risk Adult  Completed    Physical Exam: Vitals:   09/21/17 1039  BP: 128/76  Pulse: 61  Temp: (!) 97.4 F (36.3 C)  TempSrc: Oral  Weight: 141 lb (64 kg)  Height: 5\' 6"  (1.676 m)   Body mass index is 22.76 kg/m. Physical Exam  Constitutional: He appears well-developed and well-nourished.  HENT:  Mouth/Throat: Oropharynx is clear and moist.  MMM; no oral thrush  Eyes: Pupils are equal, round, and reactive to light. No scleral icterus.  Neck: Neck supple. Carotid bruit is not present. No thyromegaly present.  Cardiovascular: Normal rate, regular rhythm and intact distal pulses. Exam reveals no gallop and no friction rub.  Murmur (1/6 SEM) heard. No distal LE edema. No calf TTP  Pulmonary/Chest: Effort normal and breath sounds normal. He has no wheezes. He has no rales. He exhibits no tenderness.  Abdominal: Soft. Normal appearance and bowel sounds are normal. He exhibits no distension, no abdominal bruit, no pulsatile midline mass and no mass. There is no hepatomegaly. There is no tenderness. There is no rigidity, no rebound and no guarding. No hernia.  hepatmegaly  Musculoskeletal: He exhibits edema.  Lymphadenopathy:    He has no cervical adenopathy.    Neurological: He is alert.  Skin: Skin is warm and dry. No rash noted.  Psychiatric: He has a normal mood and affect. His behavior is normal.    Labs reviewed: Basic Metabolic Panel: Recent Labs    01/30/17 1032 06/22/17 1144 08/09/17  NA 142 140 141  K 4.6 4.6  --   CL 104 103  --   CO2 24 31  --   GLUCOSE 82 88  --   BUN 22 16  --   CREATININE 1.34* 1.25* 1.4*  CALCIUM 9.5 9.3  --   TSH 1.90  --   --    Liver Function Tests: Recent Labs    01/30/17 1032 06/22/17 1144 08/09/17  AST 23 20 17   ALT 13 12 11   ALKPHOS 181*  --  198*  BILITOT 0.6 0.5  --   PROT 7.0 6.9  --  ALBUMIN 4.1  --   --    No results for input(s): LIPASE, AMYLASE in the last 8760 hours. No results for input(s): AMMONIA in the last 8760 hours. CBC: Recent Labs    01/30/17 1032 06/22/17 1144  WBC 4.2 4.6  NEUTROABS 2,394 2,663  HGB 12.2* 12.4*  HCT 37.2* 36.9*  MCV 92.3 90.0  PLT 190 212   Lipid Panel: Recent Labs    03/16/17 0924  CHOL 186  HDL 76  LDLCALC 96  TRIG 49  CHOLHDL 2.4   No results found for: HGBA1C  Procedures since last visit: No results found.  Assessment/Plan   ICD-10-CM   1. Liver cyst K76.89   2. Mild dementia F03.90   3. Essential hypertension I10   4. Stage 3 chronic kidney disease (HCC) N18.3   5. BUNDLE BRANCH BLOCK, RIGHT I45.10     Please call liver clinic to make follow up appt with NP Drazek  RESUME SINGULAIR/MONTELUKAST FOR SEASONAL ALLERGY  Continue other medications as ordered  Follow up with specialists as scheduled   Follow up in 3 mos for HTN, liver cyst, memory loss    Marianita Botkin S. Perlie Gold  [facility] [address] [place], [place] [address] [phone] Cell (Monday-Friday 8 AM - 5 PM) [phone] After 5 PM and follow prompts

## 2017-09-21 NOTE — Patient Instructions (Addendum)
Please call liver clinic to make follow up appt with NP Drazek  RESUME SINGULAIR/MONTELUKAST FOR SEASONAL ALLERGY  Continue other medications as ordered  Follow up with specialists as scheduled   Follow up in 3 mos for HTN, liver cyst, memory loss

## 2017-09-27 ENCOUNTER — Other Ambulatory Visit: Payer: Self-pay

## 2017-09-27 ENCOUNTER — Ambulatory Visit: Payer: Medicare Other | Admitting: Neurology

## 2017-09-27 ENCOUNTER — Encounter: Payer: Self-pay | Admitting: Neurology

## 2017-09-27 VITALS — BP 128/60 | HR 65 | Ht 66.0 in | Wt 143.0 lb

## 2017-09-27 DIAGNOSIS — F0390 Unspecified dementia without behavioral disturbance: Secondary | ICD-10-CM | POA: Diagnosis not present

## 2017-09-27 DIAGNOSIS — F03A0 Unspecified dementia, mild, without behavioral disturbance, psychotic disturbance, mood disturbance, and anxiety: Secondary | ICD-10-CM

## 2017-09-27 MED ORDER — MEMANTINE HCL 5 MG PO TABS: 5.0000 mg | ORAL_TABLET | Freq: Two times a day (BID) | ORAL | 3 refills | Status: DC
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
## 2017-09-27 NOTE — Patient Instructions (Signed)
Great seeing you! 1. Start Memantine 5mg : Take 1 tablet every night for 1 week, then increase to 1 tablet twice a day 2. Follow-up in 6 months, call for any changes  FALL PRECAUTIONS: Be cautious when walking. Scan the area for obstacles that may increase the risk of trips and falls. When getting up in the mornings, sit up at the edge of the bed for a few minutes before getting out of bed. Consider elevating the bed at the head end to avoid drop of blood pressure when getting up. Walk always in a well-lit room (use night lights in the walls). Avoid area rugs or power cords from appliances in the middle of the walkways. Use a walker or a cane if necessary and consider physical therapy for balance exercise. Get your eyesight checked regularly.  FINANCIAL OVERSIGHT: Supervision, especially oversight when making financial decisions or transactions is also recommended.  HOME SAFETY: Consider the safety of the kitchen when operating appliances like stoves, microwave oven, and blender. Consider having supervision and share cooking responsibilities until no longer able to participate in those. Accidents with firearms and other hazards in the house should be identified and addressed as well.  DRIVING: Regarding driving, in patients with progressive memory problems, driving will be impaired. We advise to have someone else do the driving if trouble finding directions or if minor accidents are reported. Independent driving assessment is available to determine safety of driving.  ABILITY TO BE LEFT ALONE: If patient is unable to contact 911 operator, consider using LifeLine, or when the need is there, arrange for someone to stay with patients. Smoking is a fire hazard, consider supervision or cessation. Risk of wandering should be assessed by caregiver and if detected at any point, supervision and safe proof recommendations should be instituted.  MEDICATION SUPERVISION: Inability to self-administer medication needs  to be constantly addressed. Implement a mechanism to ensure safe administration of the medications.  RECOMMENDATIONS FOR ALL PATIENTS WITH MEMORY PROBLEMS: 1. Continue to exercise (Recommend 30 minutes of walking everyday, or 3 hours every week) 2. Increase social interactions - continue going to [place] and enjoy social gatherings with friends and family 3. Eat healthy, avoid fried foods and eat more fruits and vegetables 4. Maintain adequate blood pressure, blood sugar, and blood cholesterol level. Reducing the risk of stroke and cardiovascular disease also helps promoting better memory. 5. Avoid stressful situations. Live a simple life and avoid aggravations. Organize your time and prepare for the next day in anticipation. 6. Sleep well, avoid any interruptions of sleep and avoid any distractions in the bedroom that may interfere with adequate sleep quality 7. Avoid sugar, avoid sweets as there is a strong link between excessive sugar intake, diabetes, and cognitive impairment The Mediterranean diet has been shown to help patients reduce the risk of progressive memory disorders and reduces cardiovascular risk. This includes eating fish, eat fruits and green leafy vegetables, nuts like almonds and hazelnuts, walnuts, and also use olive oil. Avoid fast foods and fried foods as much as possible. Avoid sweets and sugar as sugar use has been linked to worsening of memory function.  There is always a concern of gradual progression of memory problems. If this is the case, then we may need to adjust level of care according to patient needs. Support, both to the patient and caregiver, should then be put into place.

## 2017-10-25 ENCOUNTER — Other Ambulatory Visit: Payer: Self-pay | Admitting: Nurse Practitioner

## 2017-10-25 DIAGNOSIS — K7689 Other specified diseases of liver: Secondary | ICD-10-CM

## 2017-10-26 ENCOUNTER — Encounter: Payer: Self-pay | Admitting: Internal Medicine

## 2017-11-06 ENCOUNTER — Ambulatory Visit
Admission: RE | Admit: 2017-11-06 | Discharge: 2017-11-06 | Disposition: A | Discharge status: Home / Self Care | Payer: Medicare Other | Source: Ambulatory Visit | Attending: Nurse Practitioner | Admitting: Nurse Practitioner

## 2017-11-06 DIAGNOSIS — K7689 Other specified diseases of liver: Secondary | ICD-10-CM

## 2017-11-06 MED ORDER — GADOXETATE DISODIUM 0.25 MMOL/ML IV SOLN
6.0000 mL | Freq: Once | INTRAVENOUS | Status: AC | PRN
  Administered 2017-11-06: 6 mL via INTRAVENOUS

## 2017-11-14 ENCOUNTER — Telehealth: Payer: Self-pay | Admitting: Cardiology

## 2017-11-14 NOTE — Telephone Encounter (Signed)
Patient;s spouse called and would like to get results of the Long Term Monitor please. Was in April and still not heard from [place].

## 2017-11-15 NOTE — Telephone Encounter (Signed)
Patient's wife, Mrs. Trickey, per DPR advised that long-term holter monitor results did show some abnormalities and Dr. Agustin Cree would like to see patient in the office sooner to discuss these results. Patient's follow up appointment rescheduled for 11/29/17 at 9:40. Mrs. Ator verbalized understanding. No further questions.

## 2017-11-29 ENCOUNTER — Ambulatory Visit: Payer: Medicare Other | Admitting: Cardiology

## 2017-11-29 ENCOUNTER — Encounter: Payer: Self-pay | Admitting: Cardiology

## 2017-11-29 VITALS — BP 124/78 | HR 54 | Ht 66.0 in | Wt 141.1 lb

## 2017-11-29 DIAGNOSIS — I10 Essential (primary) hypertension: Secondary | ICD-10-CM | POA: Diagnosis not present

## 2017-11-29 DIAGNOSIS — I472 Ventricular tachycardia: Secondary | ICD-10-CM | POA: Diagnosis not present

## 2017-11-29 DIAGNOSIS — F0390 Unspecified dementia without behavioral disturbance: Secondary | ICD-10-CM

## 2017-11-29 DIAGNOSIS — E781 Pure hyperglyceridemia: Secondary | ICD-10-CM

## 2017-11-29 DIAGNOSIS — F03A0 Unspecified dementia, mild, without behavioral disturbance, psychotic disturbance, mood disturbance, and anxiety: Secondary | ICD-10-CM

## 2017-11-29 DIAGNOSIS — I4729 Other ventricular tachycardia: Secondary | ICD-10-CM | POA: Insufficient documentation

## 2017-11-29 NOTE — Progress Notes (Signed)
Cardiology Office Note:    Date:  11/29/2017   ID:  [name], DOB 06-Aug-1925, MRN [id number]  PCP:  Dave Cranker, DO  Cardiologist:  Jenne Campus, MD    Referring MD: Dave Cranker, DO   Chief Complaint  Patient presents with  . Follow-up  I was asked to come for follow-up  History of Present Illness:    [name] is a 84 y.o. male with syncope that he suffered a few months ago quite extensive evaluation has been done so far been unrevealing however his event recorder showed frequent ventricular ectopy with one nonsustained ventricular tachycardia.  Only 3 beats and developed it was asymptomatic I invited him as well as his wife of 27 years to discuss about options for the situation.  I told him options are to simply nothing just monitoring him, try some medical therapy which will be difficult because of his bradycardia and conduction system disease: EP evaluation with potentially more aggressive treatment.  They clearly decided that what they want to do it just simply monitoring it without any medication without any aggressive approach.  I kind of agree with this approach.  Past Medical History:  Diagnosis Date  . BPH (benign prostatic hypertrophy)   . Cataract    Both  . Diverticula of colon   . Gallbladder polyp   . Glaucoma 1986   Dr. Mila Merry  . Hearing loss   . Hx of adenomatous colonic polyps   . Internal hemorrhoids   . Left varicocele   . Liver cyst   . Obesity   . Pseudodementia   . RBBB (right bundle branch block)     Past Surgical History:  Procedure Laterality Date  . CATARACT EXTRACTION Right   . GLAUCOMA REPAIR Left     Current Medications: Current Meds  Medication Sig  . aspirin EC 81 MG tablet Take 81 mg by mouth daily.  . brimonidine (ALPHAGAN) 0.2 % ophthalmic solution Place 1 drop into the left eye Twice daily.   . clotrimazole (LOTRIMIN) 1 % cream Apply 1 application topically 2 (two) times daily as needed.  . famotidine  (PEPCID) 20 MG tablet Take 20 mg by mouth at bedtime as needed for heartburn or indigestion.  . montelukast (SINGULAIR) 5 MG chewable tablet CHEW 1 TABLET (5 MG TOTAL) BY MOUTH AT BEDTIME.  . Multiple Vitamin (MULTIVITAMIN) tablet Take 1 tablet by mouth daily.  . Naproxen Sodium 220 MG CAPS Take 1 capsule by mouth daily as needed.  . timolol (TIMOPTIC) 0.5 % ophthalmic solution Place 1 drop into the left eye daily.      Allergies:   Patient has no known allergies.   Social History   Socioeconomic History  . Marital status: Married    Spouse name: Not on file  . Number of children: 4  . Years of education: Not on file  . Highest education level: Not on file  Occupational History  . Not on file  Social Needs  . Financial resource strain: Not on file  . Food insecurity:    Worry: Not on file    Inability: Not on file  . Transportation needs:    Medical: Not on file    Non-medical: Not on file  Tobacco Use  . Smoking status: Former Smoker    Packs/day: 0.30    Years: 10.00    Pack years: 3.00    Types: Cigarettes    Last attempt to quit: 06/12/1956    Years since  quitting: 61.5  . Smokeless tobacco: Never Used  Substance and Sexual Activity  . Alcohol use: No  . Drug use: No  . Sexual activity: Never  Lifestyle  . Physical activity:    Days per week: Not on file    Minutes per session: Not on file  . Stress: Not on file  Relationships  . Social connections:    Talks on phone: Not on file    Gets together: Not on file    Attends religious service: Not on file    Active member of club or organization: Not on file    Attends meetings of clubs or organizations: Not on file    Relationship status: Not on file  Other Topics Concern  . Not on file  Social History Narrative   Social History      Diet?       Do you drink/eat things with caffeine? Coffee, tea      Marital status?           married                         What year were you married? 1957      Do you  live in a house, apartment, assisted living, condo, trailer, etc.? house      Is it one or more stories? one      How many persons live in your home? 2      Do you have any pets in your home? (please list) no      Highest level of education completed? High school      Current or past profession: Camera operator      Do you exercise?           yes                           Type & how often? Walking everyday weather permitting      Do you have a living will? yes      Do you have a DNR form?      no                            If not, do you want to discuss one?      Do you have signed POA/HPOA for forms? yes      Functional Status      Do you have difficulty bathing or dressing yourself? no      Do you have difficulty preparing food or eating? no      Do you have difficulty managing your medications? no      Do you have difficulty managing your finances? yes      Do you have difficulty affording your medications? no     Family History: The patient's family history includes Cancer in his mother; Heart disease (age of onset: 3) in his father; Parkinson's disease in his father. There is no history of Colon cancer. ROS:   Please see the history of present illness.    All 14 point review of systems negative except as described per history of present illness  EKGs/Labs/Other Studies Reviewed:      Recent Labs: 01/30/2017: TSH 1.90 06/22/2017: BUN 16; Hemoglobin 12.4; Platelets 212; Potassium 4.6 08/09/2017: ALT 11; Creatinine 1.4; Sodium 141  Recent Lipid Panel  Component Value Date/Time   CHOL 186 03/16/2017 0924   TRIG 49 03/16/2017 0924   TRIG 61 05/11/2006 1035   HDL 76 03/16/2017 0924   CHOLHDL 2.4 03/16/2017 0924   VLDL 12.6 09/15/2015 1359   LDLCALC 96 03/16/2017 0924   LDLDIRECT 106.9 12/26/2011 0946    Physical Exam:    VS:  BP 124/78 (BP Location: Right Arm, Patient Position: Sitting, Cuff Size: Normal)   Pulse (!) 54   Ht 5'  6" (1.676 m)   Wt 141 lb 1.9 oz (64 kg)   SpO2 96%   BMI 22.78 kg/m     Wt Readings from Last 3 Encounters:  11/29/17 141 lb 1.9 oz (64 kg)  09/27/17 143 lb (64.9 kg)  09/21/17 141 lb (64 kg)     GEN:  Well nourished, well developed in no acute distress HEENT: Normal NECK: No JVD; No carotid bruits LYMPHATICS: No lymphadenopathy CARDIAC: RRR, no murmurs, no rubs, no gallops RESPIRATORY:  Clear to auscultation without rales, wheezing or rhonchi  ABDOMEN: Soft, non-tender, non-distended MUSCULOSKELETAL:  No edema; No deformity  SKIN: Warm and dry LOWER EXTREMITIES: no swelling NEUROLOGIC:  Alert and oriented x 3 PSYCHIATRIC:  Normal affect   ASSESSMENT:    1. Essential hypertension   2. Nonsustained ventricular tachycardia ([place])   3. Mild dementia   4. Pure hyperglyceridemia    PLAN:    In order of problems listed above:  1. Essential hypertension: Blood pressure well controlled continue present medications. 2. Nonsustained ventricular tachycardia discussion as above I discussed option with them they do not want to do any aggressive management for this problem and I think this is a right decision considering his age as well as comorbidities. 3. Mild dementia: Stable continue present management 4. Dyslipidemia: Continue present management   Medication Adjustments/Labs and Tests Ordered: Current medicines are reviewed at length with the patient today.  Concerns regarding medicines are outlined above.  No orders of the defined types were placed in this encounter.  Medication changes: No orders of the defined types were placed in this encounter.   Signed, Park Liter, MD, [facility] 11/29/2017 10:10 AM    [place]

## 2017-11-29 NOTE — Patient Instructions (Signed)
Medication Instructions:  Your physician recommends that you continue on your current medications as directed. Please refer to the Current Medication list given to you today.   Labwork: None  Testing/Procedures: None  Follow-Up: Your physician wants you to follow-up in: 5 months. You will receive a reminder letter in the mail two months in advance. If you don't receive a letter, please call our office to schedule the follow-up appointment.   If you need a refill on your cardiac medications before your next appointment, please call your pharmacy.   Thank you for choosing [facility]! Peyten Punches, RN [phone]    

## 2017-12-18 ENCOUNTER — Ambulatory Visit: Payer: Medicare Other | Admitting: Cardiology

## 2018-01-01 ENCOUNTER — Ambulatory Visit: Payer: Medicare Other | Admitting: Internal Medicine

## 2018-01-09 ENCOUNTER — Ambulatory Visit: Payer: Medicare Other | Admitting: Internal Medicine

## 2018-01-10 ENCOUNTER — Other Ambulatory Visit: Payer: Self-pay | Admitting: Internal Medicine

## 2018-01-10 DIAGNOSIS — J302 Other seasonal allergic rhinitis: Secondary | ICD-10-CM

## 2018-01-15 ENCOUNTER — Encounter: Payer: Self-pay | Admitting: Internal Medicine

## 2018-01-15 ENCOUNTER — Ambulatory Visit (INDEPENDENT_AMBULATORY_CARE_PROVIDER_SITE_OTHER): Payer: Medicare Other | Admitting: Internal Medicine

## 2018-01-15 VITALS — BP 128/74 | HR 56 | Temp 97.5°F | Resp 10 | Ht 66.0 in | Wt 141.0 lb

## 2018-01-15 DIAGNOSIS — N183 Chronic kidney disease, stage 3 unspecified: Secondary | ICD-10-CM

## 2018-01-15 DIAGNOSIS — K7689 Other specified diseases of liver: Secondary | ICD-10-CM | POA: Diagnosis not present

## 2018-01-15 DIAGNOSIS — I4729 Other ventricular tachycardia: Secondary | ICD-10-CM

## 2018-01-15 DIAGNOSIS — I10 Essential (primary) hypertension: Secondary | ICD-10-CM | POA: Diagnosis not present

## 2018-01-15 DIAGNOSIS — I472 Ventricular tachycardia: Secondary | ICD-10-CM | POA: Diagnosis not present

## 2018-01-15 DIAGNOSIS — F0390 Unspecified dementia without behavioral disturbance: Secondary | ICD-10-CM

## 2018-01-15 DIAGNOSIS — F03A0 Unspecified dementia, mild, without behavioral disturbance, psychotic disturbance, mood disturbance, and anxiety: Secondary | ICD-10-CM

## 2018-01-15 NOTE — Progress Notes (Signed)
Patient ID: [name], male   DOB: Jun 24, 1925, 84 y.o.   MRN: [id number]   Location:  [facility] OFFICE  Provider: DR Arletha Grippe  Code Status:  Goals of Care:  Advanced Directives 09/21/2017  Does Patient Have a Medical Advance Directive? Yes  Type of Advance Directive [place]  Does patient want to make changes to medical advance directive? No - Patient declined  Copy of [place] in Chart? -     Chief Complaint  Patient presents with  . Medical Management of Chronic Issues    3 month follow-up on HTN, memory and liver cyst. Here with wife, wife admits that patient does not take medications as directed. Patient takes them off and on  . Audit C Screening    Neg Audit C Screening     HPI: Patient is a 84 y.o. male seen today for medical management of chronic diseases.    He saw the liver clinic and imaging revealed cyst but drainage not recommended. MRI revealed large right hepatic lobe hemorrhagic cyst (12.4 x 9.2 x 11.5 cm); per liver clinic, low likelihood of malignancy; f/u MRI in May 2019 showed stable right hepatic lobe complex/hemorrhagic cyst 11cm/stable renal cysts/stable distended bladder with moderate trabeculation likely due to chronic bladder outlet obstruction and liver clinic recommends no further w/u. LFTs nml; alk phos 198  NSVT - followed by cardio. Cardiac monitor showed NSVT; family does not desire aggressive care.   Seasonal allergic rhinitis - stable on singulair.  CKD - stage 3. Cr 1.4   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 He has OD cats with IOL in past. He uses glaucoma gtts in OS (OD has bleb). Followed by ophthamology Dr Tora Kindred  Mild cognitive impairment/dementia - unchanged. followed by Dr Delice Lesch. Tried aricept but it caused more sedation/weakness. Stopped med and sx's improved. He remains active at home and community. No major impact on ADLs.  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  B12 deficiency - improved.  Takes OTC B12  Tabs. He has rec'd B12 injections in the past. B12 level 507 (340 in 2016)  HTN - diet controlled.  Past Medical History:  Diagnosis Date  . BPH (benign prostatic hypertrophy)   . Cataract    Both  . Diverticula of colon   . Gallbladder polyp   . Glaucoma 1986   Dr. Mila Merry  . Hearing loss   . Hx of adenomatous colonic polyps   . Internal hemorrhoids   . Left varicocele   . Liver cyst   . Obesity   . Pseudodementia   . RBBB (right bundle branch block)     Past Surgical History:  Procedure Laterality Date  . CATARACT EXTRACTION Right   . GLAUCOMA REPAIR Left      reports that he quit smoking about 61 years ago. His smoking use included cigarettes. He has a 3.00 pack-year smoking history. He has never used smokeless tobacco. He reports that he does not drink alcohol or use drugs. Social History   Socioeconomic History  . Marital status: Married    Spouse name: Not on file  . Number of children: 4  . Years of education: Not on file  . Highest education level: Not on file  Occupational History  . Not on file  Social Needs  . Financial resource strain: Not on file  . Food insecurity:  Worry: Not on file    Inability: Not on file  . Transportation needs:    Medical: Not on file    Non-medical: Not on file  Tobacco Use  . Smoking status: Former Smoker    Packs/day: 0.30    Years: 10.00    Pack years: 3.00    Types: Cigarettes    Last attempt to quit: 06/12/1956    Years since quitting: 61.6  . Smokeless tobacco: Never Used  Substance and Sexual Activity  . Alcohol use: No  . Drug use: No  . Sexual activity: Never  Lifestyle  . Physical activity:    Days per week: Not on file    Minutes per session: Not on file  . Stress: Not on file  Relationships  . Social connections:    Talks on phone: Not on file    Gets together: Not on file    Attends  religious service: Not on file    Active member of club or organization: Not on file    Attends meetings of clubs or organizations: Not on file    Relationship status: Not on file  . Intimate partner violence:    Fear of current or ex partner: Not on file    Emotionally abused: Not on file    Physically abused: Not on file    Forced sexual activity: Not on file  Other Topics Concern  . Not on file  Social History Narrative   Social History      Diet?       Do you drink/eat things with caffeine? Coffee, tea      Marital status?           married                         What year were you married? 1957      Do you live in a house, apartment, assisted living, condo, trailer, etc.? house      Is it one or more stories? one      How many persons live in your home? 2      Do you have any pets in your home? (please list) no      Highest level of education completed? High school      Current or past profession: Camera operator      Do you exercise?           yes                           Type & how often? Walking everyday weather permitting      Do you have a living will? yes      Do you have a DNR form?      no                            If not, do you want to discuss one?      Do you have signed POA/HPOA for forms? yes      Functional Status      Do you have difficulty bathing or dressing yourself? no      Do you have difficulty preparing food or eating? no      Do you have difficulty managing your medications? no      Do you have difficulty managing your  finances? yes      Do you have difficulty affording your medications? no    Family History  Problem Relation Age of Onset  . Heart disease Father 6  . Parkinson's disease Father   . Cancer Mother        ? type  . Colon cancer Neg Hx     No Known Allergies  Outpatient Encounter Medications as of 01/15/2018  Medication Sig  . aspirin EC 81 MG tablet Take 81 mg by mouth daily.  .  brimonidine (ALPHAGAN) 0.2 % ophthalmic solution Place 1 drop into the left eye Twice daily.   . clotrimazole (LOTRIMIN) 1 % cream Apply 1 application topically 2 (two) times daily as needed.  . famotidine (PEPCID) 20 MG tablet Take 20 mg by mouth at bedtime as needed for heartburn or indigestion.  . montelukast (SINGULAIR) 5 MG chewable tablet CHEW 1 TABLET (5 MG TOTAL) BY MOUTH AT BEDTIME.  . Multiple Vitamin (MULTIVITAMIN) tablet Take 1 tablet by mouth daily.  . Naproxen Sodium 220 MG CAPS Take 1 capsule by mouth daily as needed.  . timolol (TIMOPTIC) 0.5 % ophthalmic solution Place 1 drop into the left eye daily.    No facility-administered encounter medications on file as of 01/15/2018.     Review of Systems:  Review of Systems  Unable to perform ROS: Other (memory loss)    Health Maintenance  Topic Date Due  . INFLUENZA VACCINE  01/10/2018  . TETANUS/TDAP  12/25/2021  . PNA vac Low Risk Adult  Completed    Physical Exam: Vitals:   01/15/18 1108  BP: 128/74  Pulse: (!) 56  Resp: 10  Temp: (!) 97.5 F (36.4 C)  TempSrc: Oral  SpO2: 97%  Weight: 141 lb (64 kg)  Height: '5\' 6"'  (1.676 m)   Body mass index is 22.76 kg/m. Physical Exam  Constitutional: He is oriented to person, place, and time. He appears well-developed and well-nourished.  HENT:  Mouth/Throat: Oropharynx is clear and moist.  MMM; no oral thrush  Eyes: Pupils are equal, round, and reactive to light. No scleral icterus.  OS corneal clouding  Neck: Neck supple. Carotid bruit is not present. No thyromegaly present.  Cardiovascular: Normal rate, regular rhythm and intact distal pulses. Exam reveals no gallop and no friction rub.  Murmur (1/6 SEM) heard. no distal LE swelling. No calf TTP  Pulmonary/Chest: Effort normal and breath sounds normal. He has no wheezes. He has no rales. He exhibits no tenderness.  Abdominal: Soft. Bowel sounds are normal. He exhibits no distension, no abdominal bruit, no pulsatile  midline mass and no mass. There is no hepatomegaly. There is no tenderness. There is no rebound and no guarding.  Musculoskeletal: He exhibits edema (small joints).  Lymphadenopathy:    He has no cervical adenopathy.  Neurological: He is alert and oriented to person, place, and time. He has normal reflexes.  Skin: Skin is warm and dry. No rash noted.  Psychiatric: He has a normal mood and affect. His behavior is normal. Judgment and thought content normal.    Labs reviewed: Basic Metabolic Panel: Recent Labs    01/30/17 1032 06/22/17 1144 08/09/17  NA 142 140 141  K 4.6 4.6  --   CL 104 103  --   CO2 24 31  --   GLUCOSE 82 88  --   BUN 22 16  --   CREATININE 1.34* 1.25* 1.4*  CALCIUM 9.5 9.3  --   TSH 1.90  --   --  Liver Function Tests: Recent Labs    01/30/17 1032 06/22/17 1144 08/09/17  AST '23 20 17  ' ALT '13 12 11  ' ALKPHOS 181*  --  198*  BILITOT 0.6 0.5  --   PROT 7.0 6.9  --   ALBUMIN 4.1  --   --    No results for input(s): LIPASE, AMYLASE in the last 8760 hours. No results for input(s): AMMONIA in the last 8760 hours. CBC: Recent Labs    01/30/17 1032 06/22/17 1144  WBC 4.2 4.6  NEUTROABS 2,394 2,663  HGB 12.2* 12.4*  HCT 37.2* 36.9*  MCV 92.3 90.0  PLT 190 212   Lipid Panel: Recent Labs    03/16/17 0924  CHOL 186  HDL 76  LDLCALC 96  TRIG 49  CHOLHDL 2.4   No results found for: HGBA1C  Procedures since last visit: No results found.  Assessment/Plan   ICD-10-CM   1. Essential hypertension I10 CMP with eGFR([facility])  2. Stage 3 chronic kidney disease (HCC) N18.3 CMP with eGFR([facility])  3. Hepatic cyst K76.89    complex hemorrhagic; 11 cm  4. NSVT (nonsustained ventricular tachycardia) (HCC) I47.2 CMP with eGFR([facility])  5. Mild dementia F03.90     Continue current medications as ordered  Follow up with cardiology, neurology and liver clinic as scheduled  Follow up in 4 mos with [name] for memory loss, HTN, b12 deficiency   Koda Defrank S.  Perlie Gold  [facility] [address] [place], [place] [address] [phone] Cell (Monday-Friday 8 AM - 5 PM) [phone] After 5 PM and follow prompts

## 2018-01-15 NOTE — Patient Instructions (Addendum)
Continue current medications as ordered  Follow up with cardiology, neurology and liver clinic as scheduled  Follow up in 4 mos with [name] for memory loss, HTN, b12 deficiency

## 2018-01-16 LAB — COMPLETE METABOLIC PANEL WITH GFR
AG Ratio: 1.4 (calc) (ref 1.0–2.5)
ALT: 9 U/L (ref 9–46)
AST: 22 U/L (ref 10–35)
Albumin: 4 g/dL (ref 3.6–5.1)
Alkaline phosphatase (APISO): 160 U/L — ABNORMAL HIGH (ref 40–115)
BUN/Creatinine Ratio: 20 (calc) (ref 6–22)
BUN: 26 mg/dL — ABNORMAL HIGH (ref 7–25)
CO2: 22 mmol/L (ref 20–32)
Calcium: 9.5 mg/dL (ref 8.6–10.3)
Chloride: 104 mmol/L (ref 98–110)
Creat: 1.3 mg/dL — ABNORMAL HIGH (ref 0.70–1.11)
GFR, Est African American: 56 mL/min/{1.73_m2} — ABNORMAL LOW (ref >=60)
GFR, Est Non African American: 48 mL/min/{1.73_m2} — ABNORMAL LOW (ref >=60)
Globulin: 2.8 g/dL (calc) (ref 1.9–3.7)
Glucose, Bld: 88 mg/dL (ref 65–139)
Potassium: 4.5 mmol/L (ref 3.5–5.3)
Sodium: 138 mmol/L (ref 135–146)
Total Bilirubin: 0.6 mg/dL (ref 0.2–1.2)
Total Protein: 6.8 g/dL (ref 6.1–8.1)

## 2018-01-30 ENCOUNTER — Encounter: Payer: Self-pay | Admitting: Internal Medicine

## 2018-02-28 ENCOUNTER — Ambulatory Visit (INDEPENDENT_AMBULATORY_CARE_PROVIDER_SITE_OTHER): Payer: Medicare Other

## 2018-02-28 ENCOUNTER — Encounter (INDEPENDENT_AMBULATORY_CARE_PROVIDER_SITE_OTHER): Payer: Self-pay

## 2018-02-28 VITALS — BP 118/62 | HR 51 | Temp 97.5°F | Ht 66.0 in | Wt 136.0 lb

## 2018-02-28 DIAGNOSIS — Z0000 Encounter for general adult medical examination without abnormal findings: Secondary | ICD-10-CM | POA: Diagnosis not present

## 2018-02-28 DIAGNOSIS — Z23 Encounter for immunization: Secondary | ICD-10-CM

## 2018-02-28 NOTE — Progress Notes (Signed)
Subjective:   [name] is a 84 y.o. male who presents for Medicare Annual/Subsequent preventive examination.  Last AWV-02/27/2017    Objective:    Vitals: BP 118/62 (BP Location: Left Arm, Patient Position: Sitting)   Pulse (!) 51   Temp (!) 97.5 F (36.4 C) (Oral)   Ht 5\' 6"  (1.676 m)   Wt 136 lb (61.7 kg)   SpO2 94%   BMI 21.95 kg/m   Body mass index is 21.95 kg/m.  Advanced Directives 02/28/2018 09/21/2017 02/27/2017 01/30/2017 02/12/2015  Does Patient Have a Medical Advance Directive? Yes Yes Yes Yes Yes  Type of [place] administrator Power of [place] of [place];Living will  Does patient want to make changes to medical advance directive? No - Patient declined No - Patient declined No - Patient declined No - Patient declined -  Copy of [place] in Chart? Yes - Yes - -    Tobacco Social History   Tobacco Use  Smoking Status Former Smoker  . Packs/day: 0.30  . Years: 10.00  . Pack years: 3.00  . Types: Cigarettes  . Last attempt to quit: 06/12/1956  . Years since quitting: 61.7  Smokeless Tobacco Never Used     Counseling given: Not Answered   Clinical Intake:  Pre-visit preparation completed: No  Pain : No/denies pain     Nutritional Risks: Other (Comment) Diabetes: No  How often do you need to have someone help you when you read instructions, pamphlets, or other written materials from your doctor or pharmacy?: 3 - Sometimes(macular degeneration) What is the last grade level you completed in school?: High School  Interpreter Needed?: No  Information entered by :: Tyson Dense, RN  Past Medical History:  Diagnosis Date  . BPH (benign prostatic hypertrophy)   . Cataract    Both  . Diverticula of colon   . Gallbladder polyp   . Glaucoma 1986   Dr. Mila Merry  . Hearing loss   . Hx of adenomatous colonic polyps     . Internal hemorrhoids   . Left varicocele   . Liver cyst   . Obesity   . Pseudodementia   . RBBB (right bundle branch block)    Past Surgical History:  Procedure Laterality Date  . CATARACT EXTRACTION Right   . GLAUCOMA REPAIR Left    Family History  Problem Relation Age of Onset  . Heart disease Father 39  . Parkinson's disease Father   . Cancer Mother        ? type  . Colon cancer Neg Hx    Social History   Socioeconomic History  . Marital status: Married    Spouse name: Not on file  . Number of children: 4  . Years of education: Not on file  . Highest education level: Not on file  Occupational History  . Not on file  Social Needs  . Financial resource strain: Not hard at all  . Food insecurity:    Worry: Never true    Inability: Never true  . Transportation needs:    Medical: No    Non-medical: No  Tobacco Use  . Smoking status: Former Smoker    Packs/day: 0.30    Years: 10.00    Pack years: 3.00    Types: Cigarettes    Last attempt to quit: 06/12/1956    Years since quitting: 61.7  . Smokeless tobacco:  Never Used  Substance and Sexual Activity  . Alcohol use: No  . Drug use: No  . Sexual activity: Never  Lifestyle  . Physical activity:    Days per week: 5 days    Minutes per session: 20 min  . Stress: Only a little  Relationships  . Social connections:    Talks on phone: More than three times a week    Gets together: More than three times a week    Attends religious service: More than 4 times per year    Active member of club or organization: No    Attends meetings of clubs or organizations: Never    Relationship status: Married  Other Topics Concern  . Not on file  Social History Narrative   Social History      Diet?       Do you drink/eat things with caffeine? Coffee, tea      Marital status?           married                         What year were you married? 1957      Do you live in a house, apartment, assisted living, condo,  trailer, etc.? house      Is it one or more stories? one      How many persons live in your home? 2      Do you have any pets in your home? (please list) no      Highest level of education completed? High school      Current or past profession: Camera operator      Do you exercise?           yes                           Type & how often? Walking everyday weather permitting      Do you have a living will? yes      Do you have a DNR form?      no                            If not, do you want to discuss one?      Do you have signed POA/HPOA for forms? yes      Functional Status      Do you have difficulty bathing or dressing yourself? no      Do you have difficulty preparing food or eating? no      Do you have difficulty managing your medications? no      Do you have difficulty managing your finances? yes      Do you have difficulty affording your medications? no    Outpatient Encounter Medications as of 02/28/2018  Medication Sig  . aspirin EC 81 MG tablet Take 81 mg by mouth daily.  . brimonidine (ALPHAGAN) 0.2 % ophthalmic solution Place 1 drop into the left eye Twice daily.   . clotrimazole (LOTRIMIN) 1 % cream Apply 1 application topically 2 (two) times daily as needed.  . famotidine (PEPCID) 20 MG tablet Take 20 mg by mouth at bedtime as needed for heartburn or indigestion.  . montelukast (SINGULAIR) 5 MG chewable tablet CHEW 1 TABLET (5 MG TOTAL) BY MOUTH AT BEDTIME.  . Multiple Vitamin (MULTIVITAMIN) tablet  Take 1 tablet by mouth daily.  . Naproxen Sodium 220 MG CAPS Take 1 capsule by mouth daily as needed.  . timolol (TIMOPTIC) 0.5 % ophthalmic solution Place 1 drop into the left eye daily.    No facility-administered encounter medications on file as of 02/28/2018.     Activities of Daily Living In your present state of health, do you have any difficulty performing the following activities: 02/28/2018  Hearing? Y  Vision? Y  Difficulty  concentrating or making decisions? Y  Walking or climbing stairs? N  Dressing or bathing? N  Doing errands, shopping? Y  Preparing Food and eating ? Y  Using the Toilet? N  In the past six months, have you accidently leaked urine? N  Do you have problems with loss of bowel control? N  Managing your Medications? Y  Managing your Finances? Y  Housekeeping or managing your Housekeeping? Y  Some recent data might be hidden    Patient Care Team: Gildardo Cranker, DO as PCP - General (Internal Medicine) Tora Kindred Marily Lente, MD as Consulting Physician (Ophthalmology) [name], MD as Consulting Physician (Neurology) [name], MD as Consulting Physician (Pulmonary Disease)   Assessment:   This is a routine wellness examination for [place].  Exercise Activities and Dietary recommendations Current Exercise Habits: Home exercise routine, Type of exercise: walking, Time (Minutes): 20, Frequency (Times/Week): 5, Weekly Exercise (Minutes/Week): 100, Exercise limited by: None identified  Goals   None     Fall Risk Fall Risk  02/28/2018 01/15/2018 09/27/2017 09/21/2017 09/19/2016  Falls in the past year? No No Yes Yes No  Number falls in past yr: - - 2 or more 2 or more -  Injury with Fall? - - No No -  Risk Factor Category  - - High Fall Risk - -   Is the patient's home free of loose throw rugs in walkways, pet beds, electrical cords, etc?   yes      Grab bars in the bathroom? yes      Handrails on the stairs?   yes      Adequate lighting?   yes  Depression Screen PHQ 2/9 Scores 02/28/2018 06/22/2017  PHQ - 2 Score 0 0    Cognitive Function completed within the last year MMSE - Mini Mental State Exam 09/27/2017 03/27/2017 09/19/2016 03/14/2016 09/28/2015  Not completed: - Unable to complete - - -  Orientation to time 2 - 4 1 2   Orientation to Place 4 - 4 5 4   Registration 3 - 3 3 3   Attention/ Calculation 3 - 4 4 4   Recall 0 - 0 0 0  Language- name 2 objects 2 - 2 2 2   Language-  repeat 1 - 1 1 1   Language- follow 3 step command 3 - 3 3 1   Language- read & follow direction 1 - 1 1 1   Write a sentence 1 - 1 1 1   Copy design 1 - 0 1 1  Total score 21 - 23 22 20         Immunization History  Administered Date(s) Administered  . Influenza Split 03/29/2011, 03/13/2012  . Influenza Whole 03/11/2008, 03/26/2009, 02/24/2010  . Influenza, High Dose Seasonal PF 03/16/2016  . Influenza,inj,Quad PF,6+ Mos 04/02/2013, 03/17/2014, 02/12/2015, 02/27/2017, 02/28/2018  . Pneumococcal Conjugate-13 04/28/2015  . Pneumococcal Polysaccharide-23 09/10/2000  . Tdap 12/26/2011  . Zoster Recombinat (Shingrix) 04/27/2017    Qualifies for Shingles Vaccine? Second shot due, stated he will get it  Screening Tests  Health Maintenance  Topic Date Due  . TETANUS/TDAP  12/25/2021  . INFLUENZA VACCINE  Completed  . PNA vac Low Risk Adult  Completed   Cancer Screenings: Lung: Low Dose CT Chest recommended if Age 35-80 years, 30 pack-year currently smoking OR have quit w/in 15years. Patient does not qualify. Colorectal: up to date  Additional Screenings:  Hepatitis C Screening:declined Flu vaccine given today      Plan:  I have personally reviewed and addressed the Medicare Annual Wellness questionnaire and have noted the following in the patient's chart:  A. Medical and social history B. Use of alcohol, tobacco or illicit drugs  C. Current medications and supplements D. Functional ability and status E.  Nutritional status F.  Physical activity G. Advance directives H. List of other physicians I.  Hospitalizations, surgeries, and ER visits in previous 12 months J.  [place] to include hearing, vision, cognitive, depression L. Referrals and appointments - none  In addition, I have reviewed and discussed with patient certain preventive protocols, quality metrics, and best practice recommendations. A written personalized care plan for preventive services as well as  general preventive health recommendations were provided to patient.  See attached scanned questionnaire for additional information.   Signed,   Tyson Dense, RN Nurse Health Advisor  Patient Concerns: None

## 2018-02-28 NOTE — Patient Instructions (Addendum)
[name] , Thank you for taking time to come for your Medicare Wellness Visit. I appreciate your ongoing commitment to your health goals. Please review the following plan we discussed and let me know if I can assist you in the future.   Screening recommendations/referrals: Colonoscopy excluded, over age 82 Recommended yearly ophthalmology/optometry visit for glaucoma screening and checkup Recommended yearly dental visit for hygiene and checkup  Vaccinations: Influenza vaccine given today Pneumococcal vaccine up to date, completed Tdap vaccine up to date, due 12/25/2021 Shingles vaccine. Please get second shot. You received the first one on 04/27/2017  Advanced directives: In chart  Conditions/risks identified: none  Next appointment: Sherrie Mustache, NP 05/28/2018 @ 10:15am             Tyson Dense, RN 03/03/2019 @ 10:45am  Preventive Care 65 Years and Older, Male Preventive care refers to lifestyle choices and visits with your health care provider that can promote health and wellness. What does preventive care include?  A yearly physical exam. This is also called an annual well check.  Dental exams once or twice a year.  Routine eye exams. Ask your health care provider how often you should have your eyes checked.  Personal lifestyle choices, including:  Daily care of your teeth and gums.  Regular physical activity.  Eating a healthy diet.  Avoiding tobacco and drug use.  Limiting alcohol use.  Practicing safe sex.  Taking low doses of aspirin every day.  Taking vitamin and mineral supplements as recommended by your health care provider. What happens during an annual well check? The services and screenings done by your health care provider during your annual well check will depend on your age, overall health, lifestyle risk factors, and family history of disease. Counseling  Your health care provider may ask you questions about your:  Alcohol use.  Tobacco  use.  Drug use.  Emotional well-being.  Home and relationship well-being.  Sexual activity.  Eating habits.  History of falls.  Memory and ability to understand (cognition).  Work and work Statistician. Screening  You may have the following tests or measurements:  Height, weight, and BMI.  Blood pressure.  Lipid and cholesterol levels. These may be checked every 5 years, or more frequently if you are over 72 years old.  Skin check.  Lung cancer screening. You may have this screening every year starting at age 54 if you have a 30-pack-year history of smoking and currently smoke or have quit within the past 15 years.  Fecal occult blood test (FOBT) of the stool. You may have this test every year starting at age 81.  Flexible sigmoidoscopy or colonoscopy. You may have a sigmoidoscopy every 5 years or a colonoscopy every 10 years starting at age 29.  Prostate cancer screening. Recommendations will vary depending on your family history and other risks.  Hepatitis C blood test.  Hepatitis B blood test.  Sexually transmitted disease (STD) testing.  Diabetes screening. This is done by checking your blood sugar (glucose) after you have not eaten for a while (fasting). You may have this done every 1-3 years.  Abdominal aortic aneurysm (AAA) screening. You may need this if you are a current or former smoker.  Osteoporosis. You may be screened starting at age 53 if you are at high risk. Talk with your health care provider about your test results, treatment options, and if necessary, the need for more tests. Vaccines  Your health care provider may recommend certain vaccines, such as:  Influenza vaccine. This is recommended every year.  Tetanus, diphtheria, and acellular pertussis (Tdap, Td) vaccine. You may need a Td booster every 10 years.  Zoster vaccine. You may need this after age 17.  Pneumococcal 13-valent conjugate (PCV13) vaccine. One dose is recommended after age  66.  Pneumococcal polysaccharide (PPSV23) vaccine. One dose is recommended after age 72. Talk to your health care provider about which screenings and vaccines you need and how often you need them. This information is not intended to replace advice given to you by your health care provider. Make sure you discuss any questions you have with your health care provider. Document Released: 06/25/2015 Document Revised: 02/16/2016 Document Reviewed: 03/30/2015 Elsevier Interactive Patient Education  2017 [place] Prevention in the Home Falls can cause injuries. They can happen to people of all ages. There are many things you can do to make your home safe and to help prevent falls. What can I do on the outside of my home?  Regularly fix the edges of walkways and driveways and fix any cracks.  Remove anything that might make you trip as you walk through a door, such as a raised step or threshold.  Trim any bushes or trees on the path to your home.  Use bright outdoor lighting.  Clear any walking paths of anything that might make someone trip, such as rocks or tools.  Regularly check to see if handrails are loose or broken. Make sure that both sides of any steps have handrails.  Any raised decks and porches should have guardrails on the edges.  Have any leaves, snow, or ice cleared regularly.  Use sand or salt on walking paths during winter.  Clean up any spills in your garage right away. This includes oil or grease spills. What can I do in the bathroom?  Use night lights.  Install grab bars by the toilet and in the tub and shower. Do not use towel bars as grab bars.  Use non-skid mats or decals in the tub or shower.  If you need to sit down in the shower, use a plastic, non-slip stool.  Keep the floor dry. Clean up any water that spills on the floor as soon as it happens.  Remove soap buildup in the tub or shower regularly.  Attach bath mats securely with double-sided  non-slip rug tape.  Do not have throw rugs and other things on the floor that can make you trip. What can I do in the bedroom?  Use night lights.  Make sure that you have a light by your bed that is easy to reach.  Do not use any sheets or blankets that are too big for your bed. They should not hang down onto the floor.  Have a firm chair that has side arms. You can use this for support while you get dressed.  Do not have throw rugs and other things on the floor that can make you trip. What can I do in the kitchen?  Clean up any spills right away.  Avoid walking on wet floors.  Keep items that you use a lot in easy-to-reach places.  If you need to reach something above you, use a strong step stool that has a grab bar.  Keep electrical cords out of the way.  Do not use floor polish or wax that makes floors slippery. If you must use wax, use non-skid floor wax.  Do not have throw rugs and other things on the floor that  can make you trip. What can I do with my stairs?  Do not leave any items on the stairs.  Make sure that there are handrails on both sides of the stairs and use them. Fix handrails that are broken or loose. Make sure that handrails are as long as the stairways.  Check any carpeting to make sure that it is firmly attached to the stairs. Fix any carpet that is loose or worn.  Avoid having throw rugs at the top or bottom of the stairs. If you do have throw rugs, attach them to the floor with carpet tape.  Make sure that you have a light switch at the top of the stairs and the bottom of the stairs. If you do not have them, ask someone to add them for you. What else can I do to help prevent falls?  Wear shoes that:  Do not have high heels.  Have rubber bottoms.  Are comfortable and fit you well.  Are closed at the toe. Do not wear sandals.  If you use a stepladder:  Make sure that it is fully opened. Do not climb a closed stepladder.  Make sure that both  sides of the stepladder are locked into place.  Ask someone to hold it for you, if possible.  Clearly mark and make sure that you can see:  Any grab bars or handrails.  First and last steps.  Where the edge of each step is.  Use tools that help you move around (mobility aids) if they are needed. These include:  Canes.  Walkers.  Scooters.  Crutches.  Turn on the lights when you go into a dark area. Replace any light bulbs as soon as they burn out.  Set up your furniture so you have a clear path. Avoid moving your furniture around.  If any of your floors are uneven, fix them.  If there are any pets around you, be aware of where they are.  Review your medicines with your doctor. Some medicines can make you feel dizzy. This can increase your chance of falling. Ask your doctor what other things that you can do to help prevent falls. This information is not intended to replace advice given to you by your health care provider. Make sure you discuss any questions you have with your health care provider. Document Released: 03/25/2009 Document Revised: 11/04/2015 Document Reviewed: 07/03/2014 Elsevier Interactive Patient Education  2017 [employer].

## 2018-04-02 ENCOUNTER — Other Ambulatory Visit: Payer: Self-pay

## 2018-04-02 ENCOUNTER — Ambulatory Visit: Payer: Medicare Other | Admitting: Neurology

## 2018-04-02 ENCOUNTER — Encounter: Payer: Self-pay | Admitting: Neurology

## 2018-04-02 VITALS — BP 132/70 | HR 62 | Ht 69.0 in | Wt 136.0 lb

## 2018-04-02 DIAGNOSIS — F0390 Unspecified dementia without behavioral disturbance: Secondary | ICD-10-CM | POA: Diagnosis not present

## 2018-04-02 DIAGNOSIS — F03B0 Unspecified dementia, moderate, without behavioral disturbance, psychotic disturbance, mood disturbance, and anxiety: Secondary | ICD-10-CM

## 2018-04-02 LAB — BASIC METABOLIC PANEL
BUN: 15 (ref 4–21)
Creatinine: 1.2 (ref 0.6–1.3)
Glucose: 74
Potassium: 3.8 (ref 3.4–5.3)
Sodium: 142 (ref 137–147)

## 2018-04-02 LAB — CBC AND DIFFERENTIAL
HCT: 39 — AB (ref 41–53)
Hemoglobin: 12.6 — AB (ref 13.5–17.5)
WBC: 2.6

## 2018-04-02 LAB — HEPATIC FUNCTION PANEL
ALT: 11 (ref 10–40)
AST: 21 (ref 14–40)
Alkaline Phosphatase: 179 — AB (ref 25–125)
Bilirubin, Total: 1
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
## 2018-04-02 NOTE — Patient Instructions (Signed)
Great seeing you! To keep your brain healthy, it is important to keep your body healthy. Increase your water intake, continue with Ensure or Boost supplements, and take your medications regularly. I will see you back in 6 months or so!  FALL PRECAUTIONS: Be cautious when walking. Scan the area for obstacles that may increase the risk of trips and falls. When getting up in the mornings, sit up at the edge of the bed for a few minutes before getting out of bed. Consider elevating the bed at the head end to avoid drop of blood pressure when getting up. Walk always in a well-lit room (use night lights in the walls). Avoid area rugs or power cords from appliances in the middle of the walkways. Use a walker or a cane if necessary and consider physical therapy for balance exercise. Get your eyesight checked regularly.  HOME SAFETY: Consider the safety of the kitchen when operating appliances like stoves, microwave oven, and blender. Consider having supervision and share cooking responsibilities until no longer able to participate in those. Accidents with firearms and other hazards in the house should be identified and addressed as well.  ABILITY TO BE LEFT ALONE: If patient is unable to contact 911 operator, consider using LifeLine, or when the need is there, arrange for someone to stay with patients. Smoking is a fire hazard, consider supervision or cessation. Risk of wandering should be assessed by caregiver and if detected at any point, supervision and safe proof recommendations should be instituted.  RECOMMENDATIONS FOR ALL PATIENTS WITH MEMORY PROBLEMS: 1. Continue to exercise (Recommend 30 minutes of walking everyday, or 3 hours every week) 2. Increase social interactions - continue going to [place] and enjoy social gatherings with friends and family 3. Eat healthy, avoid fried foods and eat more fruits and vegetables 4. Maintain adequate blood pressure, blood sugar, and blood cholesterol level. Reducing  the risk of stroke and cardiovascular disease also helps promoting better memory. 5. Avoid stressful situations. Live a simple life and avoid aggravations. Organize your time and prepare for the next day in anticipation. 6. Sleep well, avoid any interruptions of sleep and avoid any distractions in the bedroom that may interfere with adequate sleep quality 7. Avoid sugar, avoid sweets as there is a strong link between excessive sugar intake, diabetes, and cognitive impairment The Mediterranean diet has been shown to help patients reduce the risk of progressive memory disorders and reduces cardiovascular risk. This includes eating fish, eat fruits and green leafy vegetables, nuts like almonds and hazelnuts, walnuts, and also use olive oil. Avoid fast foods and fried foods as much as possible. Avoid sweets and sugar as sugar use has been linked to worsening of memory function.  There is always a concern of gradual progression of memory problems. If this is the case, then we may need to adjust level of care according to patient needs. Support, both to the patient and caregiver, should then be put into place.

## 2018-04-04 LAB — BASIC METABOLIC PANEL
BUN: 15 (ref 4–21)
Creatinine: 1.4 — AB (ref 0.6–1.3)
Glucose: 82
Potassium: 3.5 (ref 3.4–5.3)
Sodium: 137 (ref 137–147)

## 2018-04-05 LAB — BASIC METABOLIC PANEL
BUN: 14 (ref 4–21)
Creatinine: 1.5 — AB (ref 0.6–1.3)
Glucose: 89
Potassium: 3.2 — AB (ref 3.4–5.3)
Sodium: 138 (ref 137–147)

## 2018-04-05 LAB — CBC AND DIFFERENTIAL
HCT: 31 — AB (ref 41–53)
Hemoglobin: 10.5 — AB (ref 13.5–17.5)
Platelets: 88 — AB (ref 150–399)
WBC: 9.9

## 2018-04-09 DIAGNOSIS — N401 Enlarged prostate with lower urinary tract symptoms: Secondary | ICD-10-CM

## 2018-04-09 DIAGNOSIS — A4150 Gram-negative sepsis, unspecified: Secondary | ICD-10-CM

## 2018-04-09 DIAGNOSIS — I129 Hypertensive chronic kidney disease with stage 1 through stage 4 chronic kidney disease, or unspecified chronic kidney disease: Secondary | ICD-10-CM

## 2018-04-09 DIAGNOSIS — F0390 Unspecified dementia without behavioral disturbance: Secondary | ICD-10-CM

## 2018-04-09 DIAGNOSIS — R338 Other retention of urine: Secondary | ICD-10-CM

## 2018-04-09 DIAGNOSIS — I4510 Unspecified right bundle-branch block: Secondary | ICD-10-CM

## 2018-04-09 DIAGNOSIS — K5730 Diverticulosis of large intestine without perforation or abscess without bleeding: Secondary | ICD-10-CM

## 2018-04-09 DIAGNOSIS — N183 Chronic kidney disease, stage 3 (moderate): Secondary | ICD-10-CM

## 2018-04-10 DIAGNOSIS — R339 Retention of urine, unspecified: Secondary | ICD-10-CM | POA: Insufficient documentation

## 2018-04-12 ENCOUNTER — Telehealth: Payer: Self-pay

## 2018-04-12 NOTE — Telephone Encounter (Signed)
error 

## 2018-04-16 ENCOUNTER — Ambulatory Visit (INDEPENDENT_AMBULATORY_CARE_PROVIDER_SITE_OTHER): Payer: Medicare Other | Admitting: Nurse Practitioner

## 2018-04-16 ENCOUNTER — Encounter: Payer: Self-pay | Admitting: Nurse Practitioner

## 2018-04-16 VITALS — BP 128/74 | HR 64 | Temp 97.8°F | Ht 69.0 in | Wt 137.0 lb

## 2018-04-16 DIAGNOSIS — N401 Enlarged prostate with lower urinary tract symptoms: Secondary | ICD-10-CM

## 2018-04-16 DIAGNOSIS — F0390 Unspecified dementia without behavioral disturbance: Secondary | ICD-10-CM

## 2018-04-16 DIAGNOSIS — K5730 Diverticulosis of large intestine without perforation or abscess without bleeding: Secondary | ICD-10-CM

## 2018-04-16 DIAGNOSIS — N183 Chronic kidney disease, stage 3 unspecified: Secondary | ICD-10-CM

## 2018-04-16 DIAGNOSIS — K7689 Other specified diseases of liver: Secondary | ICD-10-CM

## 2018-04-16 NOTE — Progress Notes (Signed)
Careteam: Patient Care Team: Gildardo Cranker, DO as PCP - General (Internal Medicine) Tora Kindred Marily Lente, MD as Consulting Physician (Ophthalmology) [name], MD as Consulting Physician (Neurology) [name], MD as Consulting Physician (Pulmonary Disease)  Advanced Directive information Does Patient Have a Medical Advance Directive?: No  No Known Allergies  Chief Complaint  Patient presents with  . Hospitalization Follow-up    Pt was recently admitted to [facility] 10/22 to 10/26.      HPI: Patient is a 84 y.o. male seen in the office today for hospital follow up. Pt with hx dementia, hypertension, chronic liver cyst, who presented to the hospital with fever, abdominal pain, nausea, vomiting and watery stools. He was found to have sepsis secondary to acute diverticulitis. He has completed cipro and flayl for the 10 day course.  No current diarrhea/constipation. Occasionally will report side pain but overall improved.   He was treated empirically with IV antibiotics. Blood culture revealed E. coli. A Foley catheter was placed for acute urinary retention. He was evaluated by the urologist who recommended discharging the patient with a Foley catheter and outpatient follow-up. He had hypokalemia which was repleted.  He has now followed up with urology and catheter was removed. Now wife reports they are doing I&O cath 4 times a day. This started on Oct 30th. He is now voiding on his own. She catheterized him and got nothing out because he is voiding on his own. He was placed on flomax but they need refill. She has a follow up with urology in 2 days since he is voiding on his own.   Also needs home care. Nurse and PT have evaluated but did not come back because they did not get an order so they will be needing this today Wife feeling a little overwhelmed because he is weaker and requiring more help  Had his 89th birthday on [birth year], plans to have a party in  a few weeks  Followed up with liver specialist this morning.   Review of Systems:  Review of Systems  Unable to perform ROS: Dementia    Past Medical History:  Diagnosis Date  . BPH (benign prostatic hypertrophy)   . Cataract    Both  . Diverticula of colon   . Gallbladder polyp   . Glaucoma 1986   Dr. Mila Merry  . Hearing loss   . Hx of adenomatous colonic polyps   . Internal hemorrhoids   . Left varicocele   . Liver cyst   . Obesity   . Pseudodementia   . RBBB (right bundle branch block)    Past Surgical History:  Procedure Laterality Date  . CATARACT EXTRACTION Right   . GLAUCOMA REPAIR Left    Social History:   reports that he quit smoking about 61 years ago. His smoking use included cigarettes. He has a 3.00 pack-year smoking history. He has never used smokeless tobacco. He reports that he does not drink alcohol or use drugs.  Family History  Problem Relation Age of Onset  . Heart disease Father 20  . Parkinson's disease Father   . Cancer Mother        ? type  . Colon cancer Neg Hx     Medications: Patient's Medications  New Prescriptions   No medications on file  Previous Medications   ASPIRIN EC 81 MG TABLET    Take 81 mg by mouth daily.   BRIMONIDINE (ALPHAGAN) 0.2 % OPHTHALMIC SOLUTION  Place 1 drop into the left eye Twice daily.    CLOTRIMAZOLE (LOTRIMIN) 1 % CREAM    Apply 1 application topically 2 (two) times daily as needed.   FAMOTIDINE (PEPCID) 20 MG TABLET    Take 20 mg by mouth at bedtime as needed for heartburn or indigestion.   MONTELUKAST (SINGULAIR) 5 MG CHEWABLE TABLET    CHEW 1 TABLET (5 MG TOTAL) BY MOUTH AT BEDTIME.   MULTIPLE VITAMIN (MULTIVITAMIN) TABLET    Take 1 tablet by mouth daily.   TIMOLOL (TIMOPTIC) 0.5 % OPHTHALMIC SOLUTION    Place 1 drop into the left eye daily.   Modified Medications   No medications on file  Discontinued Medications   NAPROXEN SODIUM 220 MG CAPS    Take 1 capsule by mouth daily as needed.      Physical Exam:  Vitals:   04/16/18 1316  BP: 128/74  Pulse: 64  Temp: 97.8 F (36.6 C)  TempSrc: Oral  SpO2: 95%  Weight: 137 lb (62.1 kg)  Height: 5\' 9"  (1.753 m)   Body mass index is 20.23 kg/m.  Physical Exam  Constitutional: He appears well-developed and well-nourished.  Thin frail male, NAD, incontinent of urine  HENT:  Mouth/Throat: Oropharynx is clear and moist.  MMM; no oral thrush  Eyes: Pupils are equal, round, and reactive to light. No scleral icterus.  OS corneal clouding  Neck: Neck supple. Carotid bruit is not present. No thyromegaly present.  Cardiovascular: Normal rate, regular rhythm and intact distal pulses. Exam reveals no gallop and no friction rub.  Murmur (3/6 SEM) heard. no distal LE swelling. No calf TTP  Pulmonary/Chest: Effort normal and breath sounds normal. He has no wheezes. He has no rales. He exhibits no tenderness.  Abdominal: Soft. Bowel sounds are normal. He exhibits no distension, no abdominal bruit, no pulsatile midline mass and no mass. There is no hepatomegaly. There is no tenderness. There is no rebound and no guarding.  Musculoskeletal: He exhibits no edema.  Lymphadenopathy:    He has no cervical adenopathy.  Neurological: He is alert. He has normal reflexes.  Skin: Skin is warm and dry. No rash noted.  Psychiatric: He has a normal mood and affect.   Labs reviewed: Basic Metabolic Panel: Recent Labs    06/22/17 1144 08/09/17 01/15/18 1218  NA 140 141 138  K 4.6  --  4.5  CL 103  --  104  CO2 31  --  22  GLUCOSE 88  --  88  BUN 16  --  26*  CREATININE 1.25* 1.4* 1.30*  CALCIUM 9.3  --  9.5   Liver Function Tests: Recent Labs    06/22/17 1144 08/09/17 01/15/18 1218  AST 20 17 22   ALT 12 11 9   ALKPHOS  --  198*  --   BILITOT 0.5  --  0.6  PROT 6.9  --  6.8   No results for input(s): LIPASE, AMYLASE in the last 8760 hours. No results for input(s): AMMONIA in the last 8760 hours. CBC: Recent Labs     06/22/17 1144  WBC 4.6  NEUTROABS 2,663  HGB 12.4*  HCT 36.9*  MCV 90.0  PLT 212   Lipid Panel: No results for input(s): CHOL, HDL, LDLCALC, TRIG, CHOLHDL, LDLDIRECT in the last 8760 hours. TSH: No results for input(s): TSH in the last 8760 hours. A1C: No results found for: HGBA1C   Assessment/Plan 1. Benign prostatic hyperplasia with lower urinary tract symptoms, symptom details unspecified With  urinary obstruction noted during hospitalization. Now voiding. Continues on flomax, has follow up with urology in 2 days.  - Ambulatory referral to [place] - [facility] referral to [facility]  2. Stage 3 chronic kidney disease ([place]) Noted to have hypokalemia during hospitalization which was corrected. Will follow up electrolytes and renal function.  - COMPLETE METABOLIC PANEL WITH GFR  3. Diverticulosis of large intestine without hemorrhage With recent diverticulitis. Completed flagyl and cipro. No ongoing pain at this time.  - Ambulatory referral to [place] referral to [facility] - CBC with Differential/Platelets  4. Dementia without behavioral disturbance, unspecified dementia type (HCC) Moderate disease, slow progressive decline, following with neurology routinely. Never started aricept.  - Ambulatory referral to [place] - Ambulatory referral to [facility]  5. Liver cyst Being monitored by liver specialist.   Next appt:  [name]. [place], [facility] [phone]

## 2018-04-16 NOTE — Patient Instructions (Addendum)
Continue Flomax as prescribed  Keep follow ups as scheduled.

## 2018-04-17 ENCOUNTER — Other Ambulatory Visit: Payer: Self-pay

## 2018-04-17 DIAGNOSIS — N289 Disorder of kidney and ureter, unspecified: Secondary | ICD-10-CM

## 2018-04-17 LAB — COMPLETE METABOLIC PANEL WITH GFR
AG Ratio: 1.1 (calc) (ref 1.0–2.5)
ALT: 11 U/L (ref 9–46)
AST: 22 U/L (ref 10–35)
Albumin: 3.3 g/dL — ABNORMAL LOW (ref 3.6–5.1)
Alkaline phosphatase (APISO): 96 U/L (ref 40–115)
BUN/Creatinine Ratio: 9 (calc) (ref 6–22)
BUN: 18 mg/dL (ref 7–25)
CO2: 30 mmol/L (ref 20–32)
Calcium: 8.8 mg/dL (ref 8.6–10.3)
Chloride: 99 mmol/L (ref 98–110)
Creat: 2.01 mg/dL — ABNORMAL HIGH (ref 0.70–1.11)
GFR, Est African American: 33 mL/min/{1.73_m2} — ABNORMAL LOW (ref >=60)
GFR, Est Non African American: 28 mL/min/{1.73_m2} — ABNORMAL LOW (ref >=60)
Globulin: 3.1 g/dL (calc) (ref 1.9–3.7)
Glucose, Bld: 114 mg/dL (ref 65–139)
Potassium: 3.7 mmol/L (ref 3.5–5.3)
Sodium: 136 mmol/L (ref 135–146)
Total Bilirubin: 0.6 mg/dL (ref 0.2–1.2)
Total Protein: 6.4 g/dL (ref 6.1–8.1)

## 2018-04-17 LAB — CBC WITH DIFFERENTIAL/PLATELET
Basophils Absolute: 28 cells/uL (ref 0–200)
Basophils Relative: 0.5 %
Eosinophils Absolute: 22 cells/uL (ref 15–500)
Eosinophils Relative: 0.4 %
HCT: 33.2 % — ABNORMAL LOW (ref 38.5–50.0)
Hemoglobin: 11.4 g/dL — ABNORMAL LOW (ref 13.2–17.1)
Lymphs Abs: 627 cells/uL — ABNORMAL LOW (ref 850–3900)
MCH: 30 pg (ref 27.0–33.0)
MCHC: 34.3 g/dL (ref 32.0–36.0)
MCV: 87.4 fL (ref 80.0–100.0)
MPV: 9.4 fL (ref 7.5–12.5)
Monocytes Relative: 12.6 %
Neutro Abs: 4217 cells/uL (ref 1500–7800)
Neutrophils Relative %: 75.3 %
Platelets: 430 10*3/uL — ABNORMAL HIGH (ref 140–400)
RBC: 3.8 10*6/uL — ABNORMAL LOW (ref 4.20–5.80)
RDW: 12.4 % (ref 11.0–15.0)
Total Lymphocyte: 11.2 %
WBC mixed population: 706 cells/uL (ref 200–950)
WBC: 5.6 10*3/uL (ref 3.8–10.8)

## 2018-04-25 ENCOUNTER — Other Ambulatory Visit: Payer: Medicare Other

## 2018-04-26 ENCOUNTER — Telehealth: Payer: Self-pay

## 2018-04-26 DIAGNOSIS — F0390 Unspecified dementia without behavioral disturbance: Secondary | ICD-10-CM

## 2018-04-26 DIAGNOSIS — R5381 Other malaise: Secondary | ICD-10-CM

## 2018-04-26 MED ORDER — COMMODE 3-IN-1 MISC: 0 refills | Status: DC

## 2018-04-26 NOTE — Telephone Encounter (Signed)
Orders faxed as requested  S.Chrae B/CMA

## 2018-04-26 NOTE — Telephone Encounter (Signed)
Thank you :)

## 2018-04-26 NOTE — Addendum Note (Signed)
Addended by: Logan Bores on: 04/26/2018 04:24 PM   Modules accepted: Orders

## 2018-04-26 NOTE — Telephone Encounter (Signed)
Message left on clinical intake voicemail: Anderson Malta with [facility] called to request order for 2 wheel rolling walker and 3-in-1 bedside commode   Send order for delivery to [facility] [phone]  Last OV 04/16/18

## 2018-04-26 NOTE — Telephone Encounter (Signed)
Orders pending, please associate with the appropriate diagnosis and I will fax  Thanks

## 2018-05-03 ENCOUNTER — Other Ambulatory Visit: Payer: Medicare Other

## 2018-05-03 ENCOUNTER — Ambulatory Visit: Payer: Medicare Other | Admitting: Nurse Practitioner

## 2018-05-03 DIAGNOSIS — N289 Disorder of kidney and ureter, unspecified: Secondary | ICD-10-CM

## 2018-05-03 LAB — BASIC METABOLIC PANEL WITH GFR
BUN/Creatinine Ratio: 7 (calc) (ref 6–22)
BUN: 8 mg/dL (ref 7–25)
CO2: 31 mmol/L (ref 20–32)
Calcium: 9 mg/dL (ref 8.6–10.3)
Chloride: 101 mmol/L (ref 98–110)
Creat: 1.18 mg/dL — ABNORMAL HIGH (ref 0.70–1.11)
GFR, Est African American: 63 mL/min/{1.73_m2} (ref >=60)
GFR, Est Non African American: 54 mL/min/{1.73_m2} — ABNORMAL LOW (ref >=60)
Glucose, Bld: 91 mg/dL (ref 65–139)
Potassium: 3.5 mmol/L (ref 3.5–5.3)
Sodium: 139 mmol/L (ref 135–146)

## 2018-05-12 MED ORDER — METHYLPHENIDATE HCL POWD
50.00 | Status: DC
Start: ? — End: 2018-05-12

## 2018-05-12 MED ORDER — POLYETHYLENE GLYCOL 3350 17 G PO PACK
17.00 | PACK | ORAL | Status: DC
Start: 2018-05-13 — End: 2018-05-12

## 2018-05-12 MED ORDER — ACETAMINOPHEN 650 MG RE SUPP
650.00 | RECTAL | Status: DC
Start: ? — End: 2018-05-12

## 2018-05-12 MED ORDER — PIPERACILLIN SODIUM
10.00 | Status: DC
Start: ? — End: 2018-05-12

## 2018-05-12 MED ORDER — ASPIRIN EC 81 MG PO TBEC
81.00 | DELAYED_RELEASE_TABLET | ORAL | Status: DC
Start: 2018-05-13 — End: 2018-05-12

## 2018-05-12 MED ORDER — TIMOLOL HEMIHYDRATE 0.5 % OP SOLN
1.00 | OPHTHALMIC | Status: DC
Start: 2018-05-13 — End: 2018-05-12

## 2018-05-12 MED ORDER — MELATONIN 3 MG PO TABS
3.00 | ORAL_TABLET | ORAL | Status: DC
Start: ? — End: 2018-05-12

## 2018-05-12 MED ORDER — CLOTRIMAZOLE 1 % EX CREA
1.00 | TOPICAL_CREAM | CUTANEOUS | Status: DC
Start: 2018-05-12 — End: 2018-05-12

## 2018-05-12 MED ORDER — FAMOTIDINE 20 MG PO TABS
20.00 | ORAL_TABLET | ORAL | Status: DC
Start: 2018-05-12 — End: 2018-05-12

## 2018-05-12 MED ORDER — HYDRALAZINE HCL 20 MG/ML IJ SOLN
10.00 | INTRAMUSCULAR | Status: DC
Start: ? — End: 2018-05-12

## 2018-05-12 MED ORDER — ONDANSETRON 4 MG PO TBDP
4.00 | ORAL_TABLET | ORAL | Status: DC
Start: ? — End: 2018-05-12

## 2018-05-12 MED ORDER — CLONIDINE HCL 0.1 MG PO TABS
.10 | ORAL_TABLET | ORAL | Status: DC
Start: ? — End: 2018-05-12

## 2018-05-12 MED ORDER — SORBITOL 70 % RE SOLN
30.00 | RECTAL | Status: DC
Start: ? — End: 2018-05-12

## 2018-05-12 MED ORDER — GUAIFENESIN-DM 100-10 MG/5ML PO SYRP
5.00 | ORAL_SOLUTION | ORAL | Status: DC
Start: ? — End: 2018-05-12

## 2018-05-12 MED ORDER — TAMSULOSIN HCL 0.4 MG PO CAPS
0.40 | ORAL_CAPSULE | ORAL | Status: DC
Start: 2018-05-13 — End: 2018-05-12

## 2018-05-12 MED ORDER — BRIMONIDINE TARTRATE 0.2 % OP SOLN
1.00 | OPHTHALMIC | Status: DC
Start: 2018-05-12 — End: 2018-05-12

## 2018-05-12 MED ORDER — CIPROFLOXACIN HCL 500 MG PO TABS
500.00 | ORAL_TABLET | ORAL | Status: DC
Start: ? — End: 2018-05-12

## 2018-05-12 MED ORDER — ALUMINUM-MAGNESIUM-SIMETHICONE 200-200-20 MG/5ML PO SUSP
30.00 | ORAL | Status: DC
Start: ? — End: 2018-05-12

## 2018-05-12 MED ORDER — GENERIC EXTERNAL MEDICATION
1.00 | Status: DC
Start: 2018-05-12 — End: 2018-05-12

## 2018-05-12 MED ORDER — ONDANSETRON HCL 4 MG/2ML IJ SOLN
4.00 | INTRAMUSCULAR | Status: DC
Start: ? — End: 2018-05-12

## 2018-05-12 MED ORDER — ACETAMINOPHEN 325 MG PO TABS
650.00 | ORAL_TABLET | ORAL | Status: DC
Start: ? — End: 2018-05-12

## 2018-05-12 MED ORDER — ENOXAPARIN SODIUM 40 MG/0.4ML ~~LOC~~ SOLN
40.00 | SUBCUTANEOUS | Status: DC
Start: 2018-05-12 — End: 2018-05-12

## 2018-05-16 ENCOUNTER — Ambulatory Visit: Payer: Medicare Other | Admitting: Nurse Practitioner

## 2018-05-22 ENCOUNTER — Other Ambulatory Visit: Payer: Self-pay

## 2018-05-22 ENCOUNTER — Ambulatory Visit: Payer: Medicare Other | Admitting: Physician Assistant

## 2018-05-22 ENCOUNTER — Encounter: Payer: Self-pay | Admitting: Physician Assistant

## 2018-05-22 VITALS — BP 116/64 | HR 73 | Ht 67.0 in | Wt 142.0 lb

## 2018-05-22 DIAGNOSIS — Z8719 Personal history of other diseases of the digestive system: Secondary | ICD-10-CM

## 2018-05-22 NOTE — Progress Notes (Signed)
Chief Complaint: History of diverticulitis  HPI:    [name] is a 84 year old African-American male with a past medical history as listed below, assigned to Dr. Fuller Plan, who presents to clinic today for follow-up after recent diverticulitis episode.    04/02/2018 CT of the abdomen pelvis for abdominal pain, nausea and vomiting showed haziness around the right colon, particularly around a diverticulum and they could not exclude active diverticulitis.  He was treated with Cipro and Flagyl.    05/11/2018 repeat CT abdomen pelvis for abdominal pain showed resolution of previous haziness found around diverticulum.  At that time patient had suspicion for focal pyelonephritis with a thick-walled urinary bladder.    Today, patient is accompanied by his wife and daughter who explain that back in October he was diagnosed with diverticulitis and given a round of Cipro and Flagyl while in the hospital.  Since then patient has had a repeat CT as above with no further signs of diverticulitis.  He denies any abdominal pain or change in bowel habits.  They are currently using MiraLAX once a day and patient is maintaining soft solid bowel movements 1-2 times a day.  They do ask me about diet and preventative measures going forward.    Denies fever, chills, weight loss, anorexia, nausea, vomiting, heartburn or reflux.  Past Medical History:  Diagnosis Date  . BPH (benign prostatic hypertrophy)   . Cataract    Both  . Dementia ([place])   . Diverticula of colon   . Elevated lactic acid level   . Gallbladder polyp   . Glaucoma 1986   Dr. Mila Merry  . Gram negative sepsis ([place])   . Hearing loss   . Hx of adenomatous colonic polyps   . Internal hemorrhoids   . Left varicocele   . Liver cyst   . Obesity   . Pseudodementia   . RBBB (right bundle branch block)     Past Surgical History:  Procedure Laterality Date  . CATARACT EXTRACTION Right   . GLAUCOMA REPAIR Left     Current Outpatient Medications    Medication Sig Dispense Refill  . aspirin EC 81 MG tablet Take 81 mg by mouth daily.    . bisacodyl (DULCOLAX) 5 MG EC tablet Take 3 mg by mouth as needed.    . brimonidine (ALPHAGAN) 0.2 % ophthalmic solution Place 1 drop into the left eye Twice daily.     . ciprofloxacin (CIPRO) 500 MG tablet Take 500 mg by mouth 2 (two) times daily.    . clotrimazole (LOTRIMIN) 1 % cream Apply 1 application topically 2 (two) times daily as needed.    . famotidine (PEPCID) 20 MG tablet Take 20 mg by mouth at bedtime as needed for heartburn or indigestion.    . Melatonin 3 MG TABS Take by mouth.    . Misc. Devices (COMMODE 3-IN-1) MISC Use 3-in-1 bedside commode daily as needed 1 each 0  . montelukast (SINGULAIR) 5 MG chewable tablet CHEW 1 TABLET (5 MG TOTAL) BY MOUTH AT BEDTIME. 30 tablet 2  . Multiple Vitamin (MULTIVITAMIN) tablet Take 1 tablet by mouth daily.    . polyethylene glycol (MIRALAX / GLYCOLAX) packet Take 17 g by mouth daily.    . tamsulosin (FLOMAX) 0.4 MG CAPS capsule Take 4 mg by mouth daily.    . timolol (TIMOPTIC) 0.5 % ophthalmic solution Place 1 drop into the left eye daily.      No current facility-administered medications for this visit.  Allergies as of 05/22/2018  . (No Known Allergies)    Family History  Problem Relation Age of Onset  . Heart disease Father 81  . Parkinson's disease Father   . Cancer Mother        ? type  . Colon cancer Neg Hx     Social History   Socioeconomic History  . Marital status: Married    Spouse name: Not on file  . Number of children: 4  . Years of education: Not on file  . Highest education level: Not on file  Occupational History  . Not on file  Social Needs  . Financial resource strain: Not hard at all  . Food insecurity:    Worry: Never true    Inability: Never true  . Transportation needs:    Medical: No    Non-medical: No  Tobacco Use  . Smoking status: Former Smoker    Packs/day: 0.30    Years: 10.00    Pack  years: 3.00    Types: Cigarettes    Last attempt to quit: 06/12/1956    Years since quitting: 61.9  . Smokeless tobacco: Never Used  Substance and Sexual Activity  . Alcohol use: No  . Drug use: No  . Sexual activity: Never  Lifestyle  . Physical activity:    Days per week: 5 days    Minutes per session: 20 min  . Stress: Only a little  Relationships  . Social connections:    Talks on phone: More than three times a week    Gets together: More than three times a week    Attends religious service: More than 4 times per year    Active member of club or organization: No    Attends meetings of clubs or organizations: Never    Relationship status: Married  . Intimate partner violence:    Fear of current or ex partner: No    Emotionally abused: No    Physically abused: No    Forced sexual activity: No  Other Topics Concern  . Not on file  Social History Narrative   Social History      Diet?       Do you drink/eat things with caffeine? Coffee, tea      Marital status?           married                         What year were you married? 1957      Do you live in a house, apartment, assisted living, condo, trailer, etc.? house      Is it one or more stories? one      How many persons live in your home? 2      Do you have any pets in your home? (please list) no      Highest level of education completed? High school      Current or past profession: Camera operator      Do you exercise?           yes                           Type & how often? Walking everyday weather permitting      Do you have a living will? yes      Do you have a DNR form?  no                            If not, do you want to discuss one?      Do you have signed POA/HPOA for forms? yes      Functional Status      Do you have difficulty bathing or dressing yourself? no      Do you have difficulty preparing food or eating? no      Do you have difficulty managing your  medications? no      Do you have difficulty managing your finances? yes      Do you have difficulty affording your medications? no    Review of Systems:    Constitutional: No weight loss, fever or chills Cardiovascular: No chest pain Respiratory: No SOB  Gastrointestinal: See HPI and otherwise negative   Physical Exam:  Vital signs: BP 116/64   Pulse 73   Ht 5\' 7"  (1.702 m)   Wt 142 lb (64.4 kg)   SpO2 98%   BMI 22.24 kg/m   Constitutional:   Pleasant Elderly AA male appears to be in NAD, Well developed, Well nourished, alert and cooperative Respiratory: Respirations even and unlabored. Lungs clear to auscultation bilaterally.   No wheezes, crackles, or rhonchi.  Cardiovascular: Normal S1, S2. No MRG. Regular rate and rhythm. No peripheral edema, cyanosis or pallor.  Gastrointestinal:  Soft, nondistended, nontender. No rebound or guarding. Normal bowel sounds. No appreciable masses or hepatomegaly. Psychiatric: Demonstrates good judgement and reason without abnormal affect or behaviors.  MOST RECENT LABS AND IMAGING: CBC    Component Value Date/Time   WBC 5.6 04/16/2018 1408   RBC 3.80 (L) 04/16/2018 1408   HGB 11.4 (L) 04/16/2018 1408   HCT 33.2 (L) 04/16/2018 1408   PLT 430 (H) 04/16/2018 1408   MCV 87.4 04/16/2018 1408   MCH 30.0 04/16/2018 1408   MCHC 34.3 04/16/2018 1408   RDW 12.4 04/16/2018 1408   LYMPHSABS 627 (L) 04/16/2018 1408   MONOABS 378 01/30/2017 1032   EOSABS 22 04/16/2018 1408   BASOSABS 28 04/16/2018 1408    CMP     Component Value Date/Time   NA 139 05/03/2018 1127   NA 138 04/05/2018   K 3.5 05/03/2018 1127   CL 101 05/03/2018 1127   CO2 31 05/03/2018 1127   GLUCOSE 91 05/03/2018 1127   GLUCOSE 91 05/11/2006 1035   BUN 8 05/03/2018 1127   BUN 14 04/05/2018   CREATININE 1.18 (H) 05/03/2018 1127   CALCIUM 9.0 05/03/2018 1127   PROT 6.4 04/16/2018 1408   ALBUMIN 4.1 01/30/2017 1032   AST 22 04/16/2018 1408   ALT 11 04/16/2018 1408    ALKPHOS 179 (A) 04/02/2018   BILITOT 0.6 04/16/2018 1408   GFRNONAA 54 (L) 05/03/2018 1127   GFRAA 63 05/03/2018 1127    Assessment: 1.  History of diverticulitis: Diagnosed 04/02/2018, patient had Cipro and Flagyl, repeat CT 05/11/2018 shows resolution of diverticulitis  Plan: 1.  Discussed with patient and family there is nothing further that we need to do now, especially with a clear CT recently. 2.  Going forward recommend a high-fiber diet, 25 to 35 g/day with use of fiber supplement and/or through his diet. He can eat nuts if he likes, there is no evidence that this increases incidence of diverticulitis. 3.  They can continue MiraLAX once daily if this is helping him to maintain regular bowel movements.  They need to avoid constipation. 4.  Recommend the patient increase his water intake to the 6-8 eight ounce glasses of water per day. 5.  Patient to follow in clinic with Dr. Fuller Plan or myself as needed in the future.  Ellouise Newer, PA-C [facility] 05/22/2018, 10:37 AM  Cc: [name], NP

## 2018-05-22 NOTE — Patient Instructions (Signed)
Follow a high fiber diet.  Enjoy your black walnut ice cream!!!   High-Fiber Diet Fiber, also called dietary fiber, is a type of carbohydrate found in fruits, vegetables, whole grains, and beans. A high-fiber diet can have many health benefits. Your health care provider may recommend a high-fiber diet to help:  Prevent constipation. Fiber can make your bowel movements more regular.  Lower your cholesterol.  Relieve hemorrhoids, uncomplicated diverticulosis, or irritable bowel syndrome.  Prevent overeating as part of a weight-loss plan.  Prevent heart disease, type 2 diabetes, and certain cancers.  What is my plan? The recommended daily intake of fiber includes:  38 grams for men under age 51.  98 grams for men over age 43.  46 grams for women under age 24.  75 grams for women over age 59.  You can get the recommended daily intake of dietary fiber by eating a variety of fruits, vegetables, grains, and beans. Your health care provider may also recommend a fiber supplement if it is not possible to get enough fiber through your diet. What do I need to know about a high-fiber diet?  Fiber supplements have not been widely studied for their effectiveness, so it is better to get fiber through food sources.  Always check the fiber content on thenutrition facts label of any prepackaged food. Look for foods that contain at least 5 grams of fiber per serving.  Ask your dietitian if you have questions about specific foods that are related to your condition, especially if those foods are not listed in the following section.  Increase your daily fiber consumption gradually. Increasing your intake of dietary fiber too quickly may cause bloating, cramping, or gas.  Drink plenty of water. Water helps you to digest fiber. What foods can I eat? Grains Whole-grain breads. Multigrain cereal. Oats and oatmeal. Brown rice. Barley. Bulgur wheat. [place]. Bran muffins. Popcorn. Rye wafer  crackers. Vegetables Sweet potatoes. Spinach. Kale. Artichokes. Cabbage. Broccoli. Green peas. Carrots. Squash. Fruits Berries. Pears. Apples. Oranges. Avocados. Prunes and raisins. Dried figs. Meats and Other Protein Sources [place], kidney, pinto, and soy beans. Split peas. Lentils. Nuts and seeds. Dairy Fiber-fortified yogurt. Beverages Fiber-fortified soy milk. Fiber-fortified orange juice. Other Fiber bars. The items listed above may not be a complete list of recommended foods or beverages. Contact your dietitian for more options. What foods are not recommended? Grains White bread. Pasta made with refined flour. White rice. Vegetables Fried potatoes. Canned vegetables. Well-cooked vegetables. Fruits Fruit juice. Cooked, strained fruit. Meats and Other Protein Sources Fatty cuts of meat. Fried Sales executive or fried fish. Dairy Milk. Yogurt. Cream cheese. Sour cream. Beverages Soft drinks. Other Cakes and pastries. Butter and oils. The items listed above may not be a complete list of foods and beverages to avoid. Contact your dietitian for more information. What are some tips for including high-fiber foods in my diet?  Eat a wide variety of high-fiber foods.  Make sure that half of all grains consumed each day are whole grains.  Replace breads and cereals made from refined flour or white flour with whole-grain breads and cereals.  Replace white rice with brown rice, bulgur wheat, or millet.  Start the day with a breakfast that is high in fiber, such as a cereal that contains at least 5 grams of fiber per serving.  Use beans in place of meat in soups, salads, or pasta.  Eat high-fiber snacks, such as berries, raw vegetables, nuts, or popcorn. This information is not intended to replace  advice given to you by your health care provider. Make sure you discuss any questions you have with your health care provider. Document Released: 05/29/2005 Document Revised: 11/04/2015 Document  Reviewed:  ______________________________________________________ If you are age 35 or older, your body mass index should be between 23-30. Your Body mass index is 22.24 kg/m. If this is out of the aforementioned range listed, please consider follow up with your Primary Care Provider.  If you are age 51 or younger, your body mass index should be between 19-25. Your Body mass index is 22.24 kg/m. If this is out of the aformentioned range listed, please consider follow up with your Primary Care Provider.    11/11/2013 Elsevier Interactive Patient Education  [employer].

## 2018-05-22 NOTE — Progress Notes (Signed)
In addition to today office note by Darrell Jewel , PA-C, this patient was previously followed by Dr. Delfin Edis. He underwent colonoscopy in 2012 by Dr. Olevia Perches that showed diverticulosis throughout the colon and a 3 mm adenomatous sigmoid colon polyp which was removed. Given prior colonoscopy and repeat CT findings would recommend no further evaluation at this time. If he has recurrent diverticulitis or other colorectal symptoms consider colonoscopy or ACBE.   Pricilla Riffle. Fuller Plan, MD [facility]

## 2018-05-27 ENCOUNTER — Telehealth: Payer: Self-pay

## 2018-05-27 NOTE — Telephone Encounter (Signed)
Thank you :)

## 2018-05-27 NOTE — Telephone Encounter (Signed)
[name] with [facility] called requesting verbal orders to resume skilled nursing 2 times a week for 2 weeks.VO given

## 2018-05-28 ENCOUNTER — Ambulatory Visit: Payer: Medicare Other | Admitting: Nurse Practitioner

## 2018-05-28 ENCOUNTER — Ambulatory Visit (INDEPENDENT_AMBULATORY_CARE_PROVIDER_SITE_OTHER): Payer: Medicare Other | Admitting: Cardiology

## 2018-05-28 ENCOUNTER — Encounter: Payer: Self-pay | Admitting: Cardiology

## 2018-05-28 VITALS — BP 136/70 | HR 52 | Ht 69.0 in | Wt 125.0 lb

## 2018-05-28 DIAGNOSIS — I472 Ventricular tachycardia: Secondary | ICD-10-CM | POA: Diagnosis not present

## 2018-05-28 DIAGNOSIS — I10 Essential (primary) hypertension: Secondary | ICD-10-CM | POA: Diagnosis not present

## 2018-05-28 DIAGNOSIS — I4510 Unspecified right bundle-branch block: Secondary | ICD-10-CM | POA: Diagnosis not present

## 2018-05-28 DIAGNOSIS — I4729 Other ventricular tachycardia: Secondary | ICD-10-CM

## 2018-05-28 NOTE — Patient Instructions (Signed)
Medication Instructions:  Your physician recommends that you continue on your current medications as directed. Please refer to the Current Medication list given to you today.  If you need a refill on your cardiac medications before your next appointment, please call your pharmacy.   Lab work: Your physician recommends that you return for lab work today: Tsh  If you have labs (blood work) drawn today and your tests are completely normal, you will receive your results only by: Marland Kitchen MyChart Message (if you have MyChart) OR . A paper copy in the mail If you have any lab test that is abnormal or we need to change your treatment, we will call you to review the results.  Testing/Procedures: None.   Follow-Up: At [facility], you and your health needs are our priority.  As part of our continuing mission to provide you with exceptional heart care, we have created designated Provider Care Teams.  These Care Teams include your primary Cardiologist (physician) and Advanced Practice Providers (APPs -  Physician Assistants and Nurse Practitioners) who all work together to provide you with the care you need, when you need it. You will need a follow up appointment in 5 months.  Please call our office 2 months in advance to schedule this appointment.  You may see No primary care provider on file. or another member of our [employer] Provider Team in [place]: Shirlee More, MD . Jyl Heinz, MD  Any Other Special Instructions Will Be Listed Below (If Applicable).

## 2018-05-28 NOTE — Addendum Note (Signed)
Addended by: Ashok Norris on: 05/28/2018 04:16 PM   Modules accepted: Orders

## 2018-05-28 NOTE — Progress Notes (Signed)
Cardiology Office Note:    Date:  05/28/2018   ID:  [name], DOB 11-11-1925, MRN [id number]  PCP:  [name], NP  Cardiologist:  Jenne Campus, MD    Referring MD: [name], NP   Chief Complaint  Patient presents with  . Hospitalization Follow-up  Recently hospitalized because of sepsis and urine tract infection  History of Present Illness:    [name] is a 84 y.o. male with bifascicular block also hypertension had quite extensive evaluation done before did well recently and up in the hospital because of confusion he was found to have urine tract infection with sepsis treated appropriately recovered doing well comes today with his wife as well as his 1 of his daughter who does have power of attorney.  Overall doing well sits all day in the chair and watch TV sleeps on and off.  Does have physical therapy coming and helping him  Past Medical History:  Diagnosis Date  . BPH (benign prostatic hypertrophy)   . Cataract    Both  . Dementia ([place])   . Diverticula of colon   . Elevated lactic acid level   . Gallbladder polyp   . Glaucoma 1986   Dr. Mila Merry  . Gram negative sepsis ([place])   . Hearing loss   . Hx of adenomatous colonic polyps   . Internal hemorrhoids   . Left varicocele   . Liver cyst   . Obesity   . Pseudodementia   . RBBB (right bundle branch block)     Past Surgical History:  Procedure Laterality Date  . CATARACT EXTRACTION Right   . GLAUCOMA REPAIR Left     Current Medications: Current Meds  Medication Sig  . aspirin EC 81 MG tablet Take 81 mg by mouth daily.  . bisacodyl (DULCOLAX) 5 MG EC tablet Take 3 mg by mouth as needed.  . brimonidine (ALPHAGAN) 0.2 % ophthalmic solution Place 1 drop into the left eye Twice daily.   . clotrimazole (LOTRIMIN) 1 % cream Apply 1 application topically 2 (two) times daily as needed.  . dorzolamide-timolol (COSOPT) 22.3-6.8 MG/ML ophthalmic solution Place 1 drop into the left  eye 2 (two) times daily.  . famotidine (PEPCID) 20 MG tablet Take 20 mg by mouth at bedtime as needed for heartburn or indigestion.  . Melatonin 3 MG TABS Take by mouth.  . Misc. Devices (COMMODE 3-IN-1) MISC Use 3-in-1 bedside commode daily as needed  . montelukast (SINGULAIR) 5 MG chewable tablet CHEW 1 TABLET (5 MG TOTAL) BY MOUTH AT BEDTIME.  . Multiple Vitamin (MULTIVITAMIN) tablet Take 1 tablet by mouth daily.  . polyethylene glycol (MIRALAX / GLYCOLAX) packet Take 17 g by mouth daily.  . tamsulosin (FLOMAX) 0.4 MG CAPS capsule Take 4 mg by mouth daily.     Allergies:   Patient has no known allergies.   Social History   Socioeconomic History  . Marital status: Married    Spouse name: Not on file  . Number of children: 4  . Years of education: Not on file  . Highest education level: Not on file  Occupational History  . Not on file  Social Needs  . Financial resource strain: Not hard at all  . Food insecurity:    Worry: Never true    Inability: Never true  . Transportation needs:    Medical: No    Non-medical: No  Tobacco Use  . Smoking status: Former Smoker    Packs/day:  0.30    Years: 10.00    Pack years: 3.00    Types: Cigarettes    Last attempt to quit: 06/12/1956    Years since quitting: 62.0  . Smokeless tobacco: Never Used  Substance and Sexual Activity  . Alcohol use: No  . Drug use: No  . Sexual activity: Never  Lifestyle  . Physical activity:    Days per week: 5 days    Minutes per session: 20 min  . Stress: Only a little  Relationships  . Social connections:    Talks on phone: More than three times a week    Gets together: More than three times a week    Attends religious service: More than 4 times per year    Active member of club or organization: No    Attends meetings of clubs or organizations: Never    Relationship status: Married  Other Topics Concern  . Not on file  Social History Narrative   Social History      Diet?       Do you  drink/eat things with caffeine? Coffee, tea      Marital status?           married                         What year were you married? 1957      Do you live in a house, apartment, assisted living, condo, trailer, etc.? house      Is it one or more stories? one      How many persons live in your home? 2      Do you have any pets in your home? (please list) no      Highest level of education completed? High school      Current or past profession: Camera operator      Do you exercise?           yes                           Type & how often? Walking everyday weather permitting      Do you have a living will? yes      Do you have a DNR form?      no                            If not, do you want to discuss one?      Do you have signed POA/HPOA for forms? yes      Functional Status      Do you have difficulty bathing or dressing yourself? no      Do you have difficulty preparing food or eating? no      Do you have difficulty managing your medications? no      Do you have difficulty managing your finances? yes      Do you have difficulty affording your medications? no     Family History: The patient's family history includes Cancer in his mother; Heart disease (age of onset: 38) in his father; Parkinson's disease in his father. There is no history of Colon cancer. ROS:   Please see the history of present illness.    All 14 point review of systems negative except as described per history of present illness  EKGs/Labs/Other Studies Reviewed:  Recent Labs: 04/16/2018: ALT 11; Hemoglobin 11.4; Platelets 430 05/03/2018: BUN 8; Creat 1.18; Potassium 3.5; Sodium 139  Recent Lipid Panel    Component Value Date/Time   CHOL 186 03/16/2017 0924   TRIG 49 03/16/2017 0924   TRIG 61 05/11/2006 1035   HDL 76 03/16/2017 0924   CHOLHDL 2.4 03/16/2017 0924   VLDL 12.6 09/15/2015 1359   LDLCALC 96 03/16/2017 0924   LDLDIRECT 106.9 12/26/2011 0946     Physical Exam:    VS:  BP 136/70   Pulse (!) 52   Ht 5\' 9"  (1.753 m)   Wt 125 lb (56.7 kg)   SpO2 (!) 12%   BMI 18.46 kg/m     Wt Readings from Last 3 Encounters:  05/28/18 125 lb (56.7 kg)  05/22/18 142 lb (64.4 kg)  04/16/18 137 lb (62.1 kg)     GEN:  Well nourished, well developed in no acute distress HEENT: Normal NECK: No JVD; No carotid bruits LYMPHATICS: No lymphadenopathy CARDIAC: RRR, no murmurs, no rubs, no gallops RESPIRATORY:  Clear to auscultation without rales, wheezing or rhonchi  ABDOMEN: Soft, non-tender, non-distended MUSCULOSKELETAL:  No edema; No deformity  SKIN: Warm and dry LOWER EXTREMITIES: no swelling NEUROLOGIC:  Alert and oriented x 3 PSYCHIATRIC:  Normal affect   ASSESSMENT:    1. BUNDLE BRANCH BLOCK, RIGHT   2. Nonsustained ventricular tachycardia (HCC)   3. Essential hypertension    PLAN:    In order of problems listed above:  1. Bundle branch block old, noted 2. Nonsustained ventricular tachycardia asymptomatic no dizziness passing out family does not want to do anything about it 3. Essential hypertension blood pressure well controlled we will continue present management.   Medication Adjustments/Labs and Tests Ordered: Current medicines are reviewed at length with the patient today.  Concerns regarding medicines are outlined above.  No orders of the defined types were placed in this encounter.  Medication changes: No orders of the defined types were placed in this encounter.   Signed, Park Liter, MD, [facility] 05/28/2018 4:09 PM    [facility]

## 2018-05-29 ENCOUNTER — Ambulatory Visit: Payer: Medicare Other | Admitting: Nurse Practitioner

## 2018-05-29 LAB — TSH: TSH: 4.52 u[IU]/mL — ABNORMAL HIGH (ref 0.450–4.500)

## 2018-05-30 ENCOUNTER — Encounter: Payer: Self-pay | Admitting: Nurse Practitioner

## 2018-05-30 ENCOUNTER — Ambulatory Visit (INDEPENDENT_AMBULATORY_CARE_PROVIDER_SITE_OTHER): Payer: Medicare Other | Admitting: Nurse Practitioner

## 2018-05-30 VITALS — BP 116/64 | HR 72 | Temp 97.4°F | Ht 67.0 in | Wt 125.0 lb

## 2018-05-30 DIAGNOSIS — K5904 Chronic idiopathic constipation: Secondary | ICD-10-CM

## 2018-05-30 DIAGNOSIS — N401 Enlarged prostate with lower urinary tract symptoms: Secondary | ICD-10-CM

## 2018-05-30 DIAGNOSIS — N183 Chronic kidney disease, stage 3 unspecified: Secondary | ICD-10-CM

## 2018-05-30 DIAGNOSIS — R634 Abnormal weight loss: Secondary | ICD-10-CM

## 2018-05-30 DIAGNOSIS — F0390 Unspecified dementia without behavioral disturbance: Secondary | ICD-10-CM | POA: Diagnosis not present

## 2018-05-30 DIAGNOSIS — R5381 Other malaise: Secondary | ICD-10-CM

## 2018-05-30 NOTE — Patient Instructions (Signed)
  Increase calories, make sure they are "good" calories with nutritional values  Make sure he is having good bowel movement daily   Diverticulitis  Diverticulitis is when small pockets in your large intestine (colon) get infected or swollen. This causes stomach pain and watery poop (diarrhea). These pouches are called diverticula. They form in people who have a condition called diverticulosis. Follow these instructions at home: Medicines  Take over-the-counter and prescription medicines only as told by your doctor. These include: ? Antibiotics. ? Pain medicines. ? Fiber pills. ? Probiotics. ? Stool softeners.  Do not drive or use heavy machinery while taking prescription pain medicine.  If you were prescribed an antibiotic, take it as told. Do not stop taking it even if you feel better. General instructions   Follow a diet as told by your doctor.  When you feel better, your doctor may tell you to change your diet. You may need to eat a lot of fiber. Fiber makes it easier to poop (have bowel movements). Healthy foods with fiber include: ? Berries. ? Beans. ? Lentils. ? Green vegetables.  Exercise 3 or more times a week. Aim for 30 minutes each time. Exercise enough to sweat and make your heart beat faster.  Keep all follow-up visits as told. This is important. You may need to have an exam of the large intestine. This is called a colonoscopy. Contact a doctor if:  Your pain does not get better.  You have a hard time eating or drinking.  You are not pooping like normal. Get help right away if:  Your pain gets worse.  Your problems do not get better.  Your problems get worse very fast.  You have a fever.  You throw up (vomit) more than one time.  You have poop that is: ? Bloody. ? Black. ? Tarry. Summary  Diverticulitis is when small pockets in your large intestine (colon) get infected or swollen.  Take medicines only as told by your doctor.  Follow a diet  as told by your doctor. This information is not intended to replace advice given to you by your health care provider. Make sure you discuss any questions you have with your health care provider. Document Released: 11/15/2007 Document Revised: 06/15/2016 Document Reviewed: 06/15/2016 Elsevier Interactive Patient Education  2019 [employer].

## 2018-05-30 NOTE — Progress Notes (Signed)
Careteam: Patient Care Team: Dave Chandler, NP as PCP - General (Geriatric Medicine) Dave Natal, MD as Consulting Physician (Ophthalmology) Dave Sprang, MD as Consulting Physician (Neurology) Dave Rockers, MD as Consulting Physician (Pulmonary Disease)  Advanced Directive information    No Known Allergies  Chief Complaint  Patient presents with  . Medical Management of Chronic Issues    4 month follow-up, patient with frequent hospital and ER visits within the last 2 months. FYI getting OT and PT with [facility]. FYI patient will see urologist Dr.Hall tomorrow. Patient c/o pain on left lower abdomen. Here with wife and daughter [name].   . Best Practice Recommendations    Discuss need for shingrix and Lipid panel due      HPI: Patient is a 84 y.o. male seen in the office today for routine follow up hx dementia, hypertension, chronic liver cyst, urinary retention.  He has been seen multiple times in the ED due to BPH with obstruction and following with urology at this time. Hx of hospitalization for urosepsis in October. He had indwelling catheter which was removed and wife was doing an in and out cath. Most recent hospitalization at the end of November due to wife not being able to pass catheter and increase pain. Pt was treated for another UTI in hospital and foley was left in place. He will return to urologist for exchange. Continues on flomax 0.4 mg daily  Has home health coming in but just takes his vitals and talks to him a little bit. No care with catheter. He is also getting PT/OT.  Dementia- he has been evaluated by neurology. MMSE 21/30. memantine offered at neurology apt but decided to hold off at this time. Memory has been stable. A little worse when he was in the hospital.  Needing help with dressing and bathing.  No anxiety or agitation.   He saw the liver clinic and imaging revealed cyst but drainage not recommended. MRI revealed large  right hepatic lobe hemorrhagic cyst (12.4 x 9.2 x 11.5 cm); per liver clinic, low likelihood of malignancy; f/u MRI in May 2019 showed stable right hepatic lobe complex/hemorrhagic cyst 11cm/stable renal cysts/stable distended bladder with moderate trabeculation likely due to chronic bladder outlet obstruction and liver clinic recommends no further w/u. LFTs nml; alk phos 198- last visit in December.   NSVT - followed by cardio. Last OV 05/28/18,  family does not desire aggressive care.   Seasonal allergic rhinitis - stable on singulair as needed.  CKD - stage 3. Cr 1.4   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 He has OD cats with IOL in past. He uses glaucoma gtts in OS (OD has bleb). Followed by ophthamology Dr Tora [facility]  B12 deficiency - improved.  no longer on supplements.   HTN - diet controlled.  Weight loss- decrease appetite, wife states that last week they started protein supplements. Taking 2 a day, and eating better. Feels like 142 lbs was in error however he has lost about 20 lbs in the last 6 months.  Wt Readings from Last 3 Encounters:  05/30/18 125 lb (56.7 kg)  05/28/18 125 lb (56.7 kg)  05/22/18 142 lb (64.4 kg)   Review of Systems:  Review of Systems  Unable to perform ROS: Dementia    Past Medical History:  Diagnosis Date  . BPH (benign prostatic hypertrophy)   . Cataract    Both  . Dementia (  [place])   . Diverticula of colon   . Elevated lactic acid level   . Gallbladder polyp   . Glaucoma 1986   Dr. Mila Merry  . Gram negative sepsis ([place])   . Hearing loss   . Hx of adenomatous colonic polyps   . Internal hemorrhoids   . Left varicocele   . Liver cyst   . Obesity   . Pseudodementia   . RBBB (right bundle branch block)    Past Surgical History:  Procedure Laterality Date  . CATARACT EXTRACTION Right   . GLAUCOMA REPAIR Left    Social History:   reports that he quit smoking about 62 years ago. His  smoking use included cigarettes. He has a 3.00 pack-year smoking history. He has never used smokeless tobacco. He reports that he does not drink alcohol or use drugs.  Family History  Problem Relation Age of Onset  . Heart disease Father 60  . Parkinson's disease Father   . Cancer Mother        ? type  . Colon cancer Neg Hx     Medications: Patient's Medications  New Prescriptions   No medications on file  Previous Medications   ASPIRIN EC 81 MG TABLET    Take 81 mg by mouth daily.   BRIMONIDINE (ALPHAGAN) 0.2 % OPHTHALMIC SOLUTION    Place 1 drop into the left eye Twice daily.    CLOTRIMAZOLE (LOTRIMIN) 1 % CREAM    Apply 1 application topically 2 (two) times daily as needed.   DORZOLAMIDE-TIMOLOL (COSOPT) 22.3-6.8 MG/ML OPHTHALMIC SOLUTION    Place 1 drop into the left eye 2 (two) times daily.   FAMOTIDINE (PEPCID) 20 MG TABLET    Take 20 mg by mouth at bedtime as needed for heartburn or indigestion.   MISC. DEVICES (COMMODE 3-IN-1) MISC    Use 3-in-1 bedside commode daily as needed   MONTELUKAST (SINGULAIR) 5 MG CHEWABLE TABLET    CHEW 1 TABLET (5 MG TOTAL) BY MOUTH AT BEDTIME.   MULTIPLE VITAMIN (MULTIVITAMIN) TABLET    Take 1 tablet by mouth daily.   POLYETHYLENE GLYCOL (MIRALAX / GLYCOLAX) PACKET    Take 17 g by mouth daily.   TAMSULOSIN (FLOMAX) 0.4 MG CAPS CAPSULE    Take 4 mg by mouth daily.  Modified Medications   No medications on file  Discontinued Medications   BISACODYL (DULCOLAX) 5 MG EC TABLET    Take 3 mg by mouth as needed.   MELATONIN 3 MG TABS    Take by mouth.     Physical Exam:  Vitals:   05/30/18 1011  BP: 116/64  Pulse: 72  Temp: (!) 97.4 F (36.3 C)  TempSrc: Oral  SpO2: 92%  Weight: 125 lb (56.7 kg)  Height: 5' 7" (1.702 m)   Body mass index is 19.58 kg/m.  Physical Exam Constitutional:      Appearance: He is well-developed.     Comments: Thin frail male, NAD, incontinent of urine  Eyes:     General: No scleral icterus.    Pupils:  Pupils are equal, round, and reactive to light.     Comments: OS corneal clouding  Neck:     Musculoskeletal: Neck supple.     Thyroid: No thyromegaly.     Vascular: No carotid bruit.  Cardiovascular:     Rate and Rhythm: Normal rate and regular rhythm.     Heart sounds: Murmur (3/6 SEM) present. No friction rub. No gallop.  Comments: no distal LE swelling. No calf TTP Pulmonary:     Effort: Pulmonary effort is normal.     Breath sounds: Normal breath sounds. No wheezing or rales.  Chest:     Chest wall: No tenderness.  Abdominal:     General: Bowel sounds are normal. There is no distension or abdominal bruit.     Palpations: Abdomen is soft. There is no hepatomegaly, mass or pulsatile mass.     Tenderness: There is no abdominal tenderness. There is no guarding or rebound.  Lymphadenopathy:     Cervical: No cervical adenopathy.  Skin:    General: Skin is warm and dry.     Findings: No rash.  Neurological:     Mental Status: He is alert.     Deep Tendon Reflexes: Reflexes are normal and symmetric.     Labs reviewed: Basic Metabolic Panel: Recent Labs    01/15/18 1218  04/05/18 04/16/18 1408 05/03/18 1127 05/28/18 1631  NA 138   < > 138 136 139  --   K 4.5   < > 3.2* 3.7 3.5  --   CL 104  --   --  99 101  --   CO2 22  --   --  30 31  --   GLUCOSE 88  --   --  114 91  --   BUN 26*   < > _0 --   CREATININE 1.30*   < > 1.5* 2.01* 1.18*  --   CALCIUM 9.5  --   --  8.8 9.0  --   TSH  --   --   --   --   --  4.520*   < > = values in this interval not displayed.   Liver Function Tests: Recent Labs    06/22/17 1144 08/09/17 01/15/18 1218 04/02/18 04/16/18 1408  AST _1 ALT _2 ALKPHOS  --  198*  --  179*  --   BILITOT 0.5  --  0.6  --  0.6  PROT 6.9  --  6.8  --  6.4   No results for input(s): LIPASE, AMYLASE in the last 8760 hours. No results for input(s): AMMONIA in the last 8760 hours. CBC: Recent Labs    06/22/17 1144  04/02/18 04/05/18 04/16/18 1408  WBC 4.6 2.6 9.9 5.6  NEUTROABS 2,663  --   --  4,217  HGB 12.4* 12.6* 10.5* 11.4*  HCT 36.9* 39* 31* 33.2*  MCV 90.0  --   --  87.4  PLT 212  --  88* 430*   Lipid Panel: No results for input(s): CHOL, HDL, LDLCALC, TRIG, CHOLHDL, LDLDIRECT in the last 8760 hours. TSH: Recent Labs    05/28/18 1631  TSH 4.520*   A1C: No results found for: HGBA1C   Assessment/Plan 1. Dementia without behavioral disturbance, unspecified dementia type ([place]) Stable without acute decline, did not tolerate aricept and did not wish to start namenda. Wife helps him at home.   2. Debility Weakness since multiple UTI and hospitalization, working with PT/OT at this time   3. Benign prostatic hyperplasia with lower urinary tract symptoms, symptom details unspecified Ongoing, now with chronic foley, following with urology tomorrow for catheter change.   4. Stage 3 chronic kidney disease ([place]) Cr Stable in November, Encourage proper hydration and to avoid NSAIDS (Aleve, Advil, Motrin, Ibuprofen)   5. Weight loss Weight 141 lbs in Aug 2019 and  125 lb today, pt with multiple UTI requiring hospitalization. Encouraged to liberalize diet and increase nutritional calories. To continue supplement twice daily after meal.   6. Constipation Continues on miralax daily with good results.   Next appt: 2 months for follow up on weight, dementia, constipation   K. [place], [facility] [phone]

## 2018-05-31 ENCOUNTER — Telehealth: Payer: Self-pay | Admitting: Emergency Medicine

## 2018-05-31 DIAGNOSIS — I10 Essential (primary) hypertension: Secondary | ICD-10-CM

## 2018-05-31 DIAGNOSIS — R634 Abnormal weight loss: Secondary | ICD-10-CM

## 2018-05-31 NOTE — Telephone Encounter (Signed)
Patient's wife advised of lab result and thyroid panel added on. She verbally understands

## 2018-06-01 LAB — SPECIMEN STATUS REPORT

## 2018-06-04 ENCOUNTER — Telehealth: Payer: Self-pay | Admitting: *Deleted

## 2018-06-04 NOTE — Telephone Encounter (Signed)
Katie with [facility] called for verbal order for [employer] for skilled nursing 1x8wks.  Verbal order given. Nurse will be also faxing orders.

## 2018-06-07 ENCOUNTER — Telehealth: Payer: Self-pay | Admitting: Nurse Practitioner

## 2018-06-07 NOTE — Telephone Encounter (Signed)
I spoke with the patient's wife about need for more assistance.  I explained that Medicare doesn't usually cover personal care but she can check with [facility] to find out if they offer any special services/benefits.  I also provided her with the 24/7 helpline for the [employer] [phone]) and explained that they offer information, advice, support, resources, etc.

## 2018-06-08 DIAGNOSIS — M1991 Primary osteoarthritis, unspecified site: Secondary | ICD-10-CM

## 2018-06-08 DIAGNOSIS — I129 Hypertensive chronic kidney disease with stage 1 through stage 4 chronic kidney disease, or unspecified chronic kidney disease: Secondary | ICD-10-CM

## 2018-06-08 DIAGNOSIS — R338 Other retention of urine: Secondary | ICD-10-CM

## 2018-06-08 DIAGNOSIS — K5730 Diverticulosis of large intestine without perforation or abscess without bleeding: Secondary | ICD-10-CM

## 2018-06-08 DIAGNOSIS — Z466 Encounter for fitting and adjustment of urinary device: Secondary | ICD-10-CM

## 2018-06-08 DIAGNOSIS — F0390 Unspecified dementia without behavioral disturbance: Secondary | ICD-10-CM

## 2018-06-08 DIAGNOSIS — N183 Chronic kidney disease, stage 3 (moderate): Secondary | ICD-10-CM

## 2018-06-08 DIAGNOSIS — N401 Enlarged prostate with lower urinary tract symptoms: Secondary | ICD-10-CM

## 2018-06-18 LAB — THYROID PANEL
Free Thyroxine Index: 2.1 (ref 1.2–4.9)
T3 Uptake Ratio: 28 % (ref 24–39)
T4, Total: 7.4 ug/dL (ref 4.5–12.0)

## 2018-06-19 ENCOUNTER — Telehealth: Payer: Self-pay | Admitting: *Deleted

## 2018-06-19 NOTE — Telephone Encounter (Signed)
Patient wife called and stated that the Cardiologist did a thyroid test on patient because he complained of being cold all the time. The test came back abnormal and the Dr. Geraldine Solar the PCP to address. Wife stated that patient does not have an appointment till 2/20. Stated that the Dr. Was going to forwarded results to [name], wants to know if you received and your advice. Please Advise.

## 2018-06-19 NOTE — Telephone Encounter (Signed)
Results were marginally elevated, we will keep an eye on this but no need for intervention at this time.

## 2018-06-19 NOTE — Telephone Encounter (Signed)
Patient wife notified and agreed.  

## 2018-06-27 ENCOUNTER — Telehealth: Payer: Self-pay | Admitting: *Deleted

## 2018-06-27 NOTE — Telephone Encounter (Signed)
Loni Dolly, daughter dropped off FMLA Paperwork from her Employee, [employer]. Employee # [phone] Supervisor Jaci [employer] Employee ID# [id number]  Placed paperwork in [place] folder to review, fill out and sign.   To call Loni Dolly once completed for pick up [phone]

## 2018-07-16 HISTORY — PX: TRANSURETHRAL RESECTION OF PROSTATE: SHX73

## 2018-07-18 MED ORDER — KCL IN DEXTROSE-NACL 20-5-0.45 MEQ/L-%-% IV SOLN
INTRAVENOUS | Status: DC
Start: ? — End: 2018-07-18

## 2018-07-18 MED ORDER — HYDROCODONE-ACETAMINOPHEN 5-325 MG PO TABS
1.00 | ORAL_TABLET | ORAL | Status: DC
Start: ? — End: 2018-07-18

## 2018-07-18 MED ORDER — BRIMONIDINE TARTRATE 0.2 % OP SOLN
1.00 | OPHTHALMIC | Status: DC
Start: 2018-07-18 — End: 2018-07-18

## 2018-07-18 MED ORDER — BELLADONNA ALKALOIDS-OPIUM 16.2-30 MG RE SUPP
1.00 | RECTAL | Status: DC
Start: ? — End: 2018-07-18

## 2018-07-18 MED ORDER — ACETAMINOPHEN 325 MG PO TABS
325.00 | ORAL_TABLET | ORAL | Status: DC
Start: ? — End: 2018-07-18

## 2018-07-18 MED ORDER — GENERIC EXTERNAL MEDICATION
1.00 | Status: DC
Start: 2018-07-19 — End: 2018-07-18

## 2018-07-18 MED ORDER — SENNOSIDES-DOCUSATE SODIUM 8.6-50 MG PO TABS
1.00 | ORAL_TABLET | ORAL | Status: DC
Start: 2018-07-18 — End: 2018-07-18

## 2018-07-19 ENCOUNTER — Telehealth: Payer: Self-pay | Admitting: *Deleted

## 2018-07-19 ENCOUNTER — Telehealth: Payer: Self-pay

## 2018-07-19 NOTE — Telephone Encounter (Signed)
FYI- No action required   Nurse with [facility] called to inform Dave Chandler, NP that resumption of care was scheduled for today yet patient refused and requested to resume care next week on Tuesday 07/23/2018. Patient was tired from other engagements

## 2018-07-19 NOTE — Telephone Encounter (Signed)
I have made the 1st attempt to contact the patient or family member in charge, in order to follow up from recently being discharged from the hospital. I left a message on voicemail but I will make another attempt at a different time.   Per [name]: "He will need a TOC call please"  D/C 07/18/18 from [facility] with Obstruction.

## 2018-07-19 NOTE — Telephone Encounter (Signed)
Transition Care Management Follow-up Telephone Call  Date of discharge and from where: 07/18/2018, [facility]   How have you been since you were released from the hospital? Patient is doing ok, no complaints of pain, still with low energy  Any questions or concerns? No   Items Reviewed:  Did the pt receive and understand the discharge instructions provided? Yes, will see Urologist on Monday for follow-up  Medications obtained and verified? Yes, updated medciation list to reflect changes made in hospital  Any new allergies since your discharge? No  Dietary orders reviewed? No diet restrictions per patient's wife   Do you have support at home? Yes   Other (ie: DME, Home Health, etc) Patient has walker and cane that he does not use. Patient has a shower bench. [facility] will resume care on 07/23/2018, due to prior engagements   Functional Questionnaire: (I = Independent and D = Dependent) ADL's: D  Bathing/Dressing- D   Meal Prep- D  Eating- D  Maintaining continence- D  Transferring/Ambulation- D  Managing Meds- D   Follow up appointments reviewed:    PCP Hospital f/u appt confirmed? Yes, patient will keep appointment already pending for 08/01/2018. Patient's wife did not wish for patient to be seen sooner for he has so many other appointments and engagements at this time   [facility] f/u appt confirmed?  Scheduled to see Urologist- Dr.Hall [facility]) on Monday 07/22/2018 @ 9:45 am .  Are transportation arrangements needed? Patient's wife will drive patient, appt about 5 min from home  If their condition worsens, is the pt aware to call  their PCP or go to the ED? Yes   Was the patient provided with contact information for the PCP's office or ED? Yes  Was the pt encouraged to call back with questions or concerns? Yes

## 2018-07-22 NOTE — Telephone Encounter (Signed)
noted 

## 2018-07-26 IMAGING — DX DG CHEST 2V
2 series · 2 of 2 positions shown · non-contrast
Comparison: 09/15/2015

CLINICAL DATA: Coughing congestion for 1-1/2 weeks.

EXAM:
CHEST  2 VIEW

[chest pa]
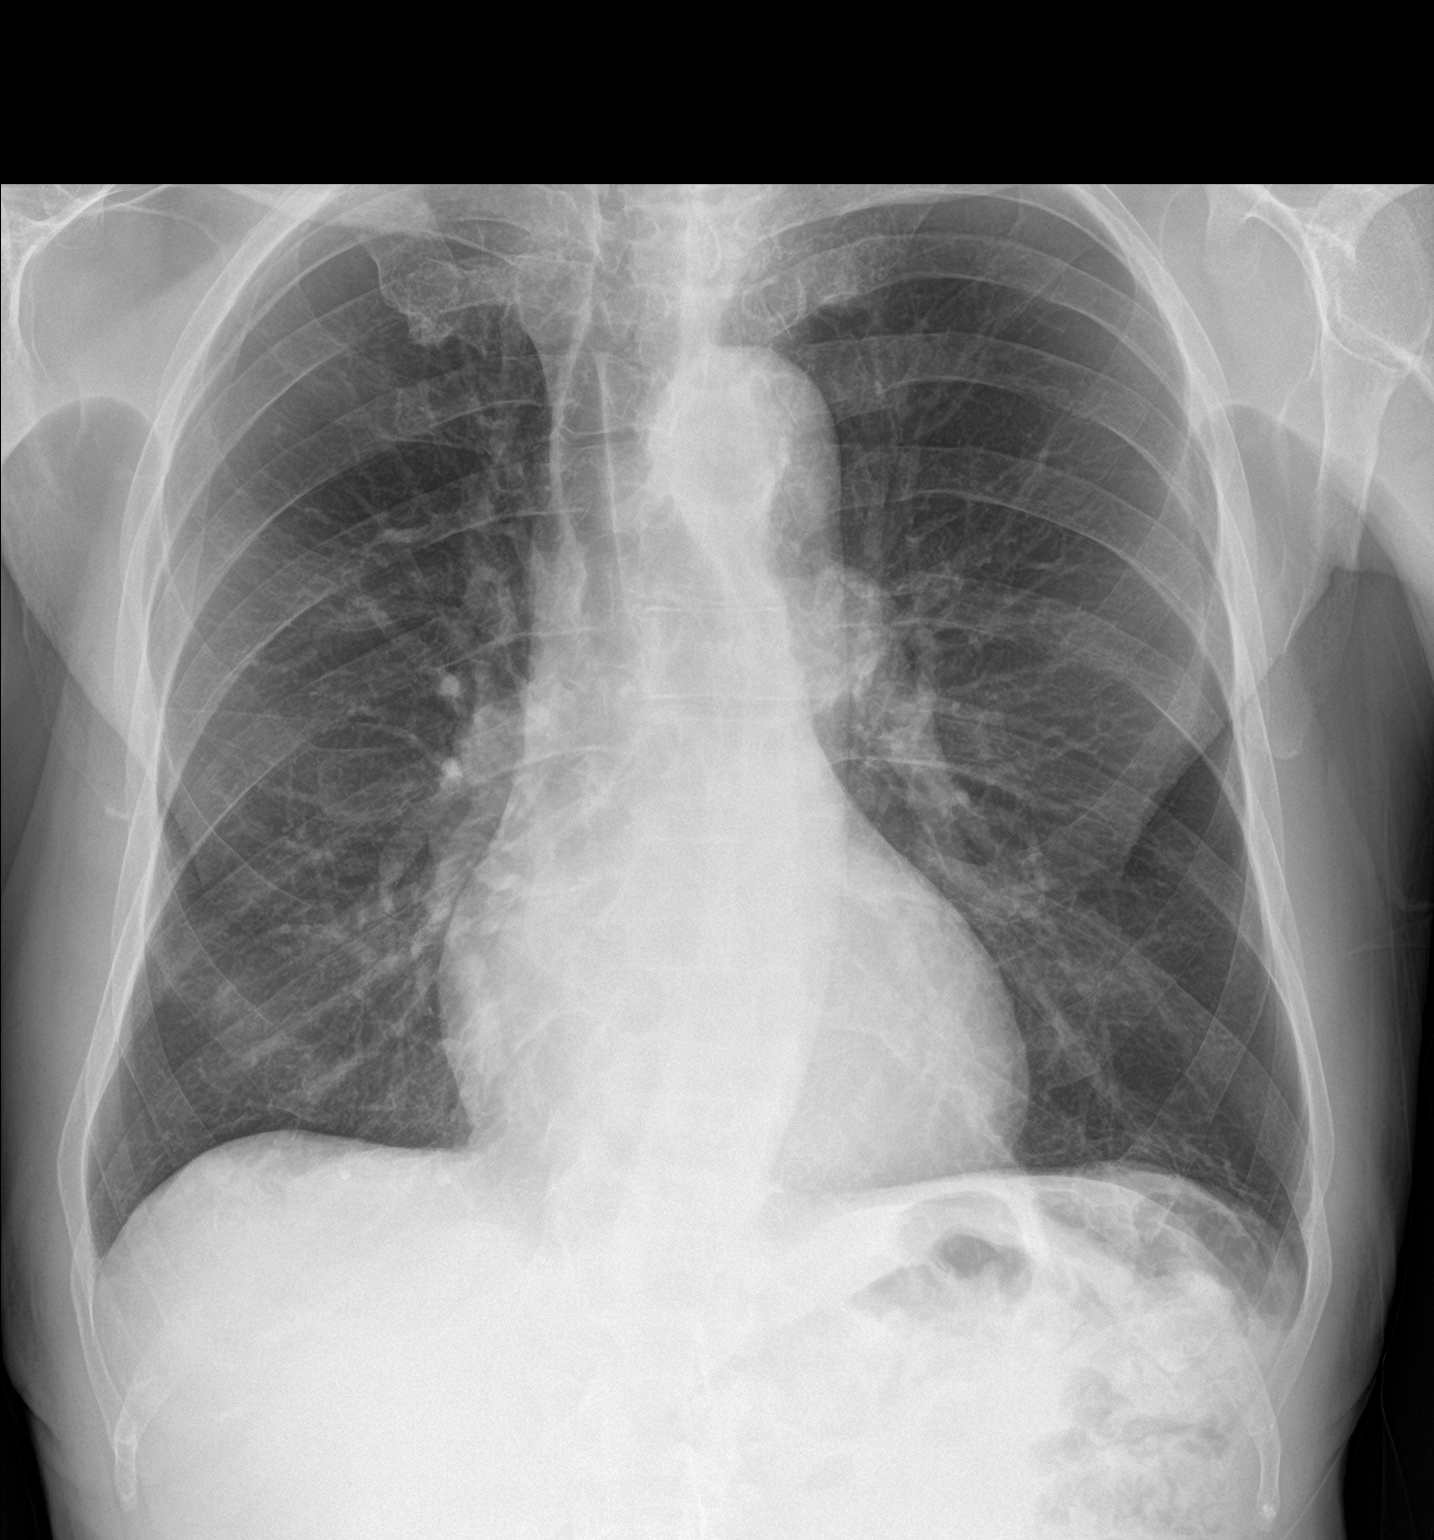

[chest lat]
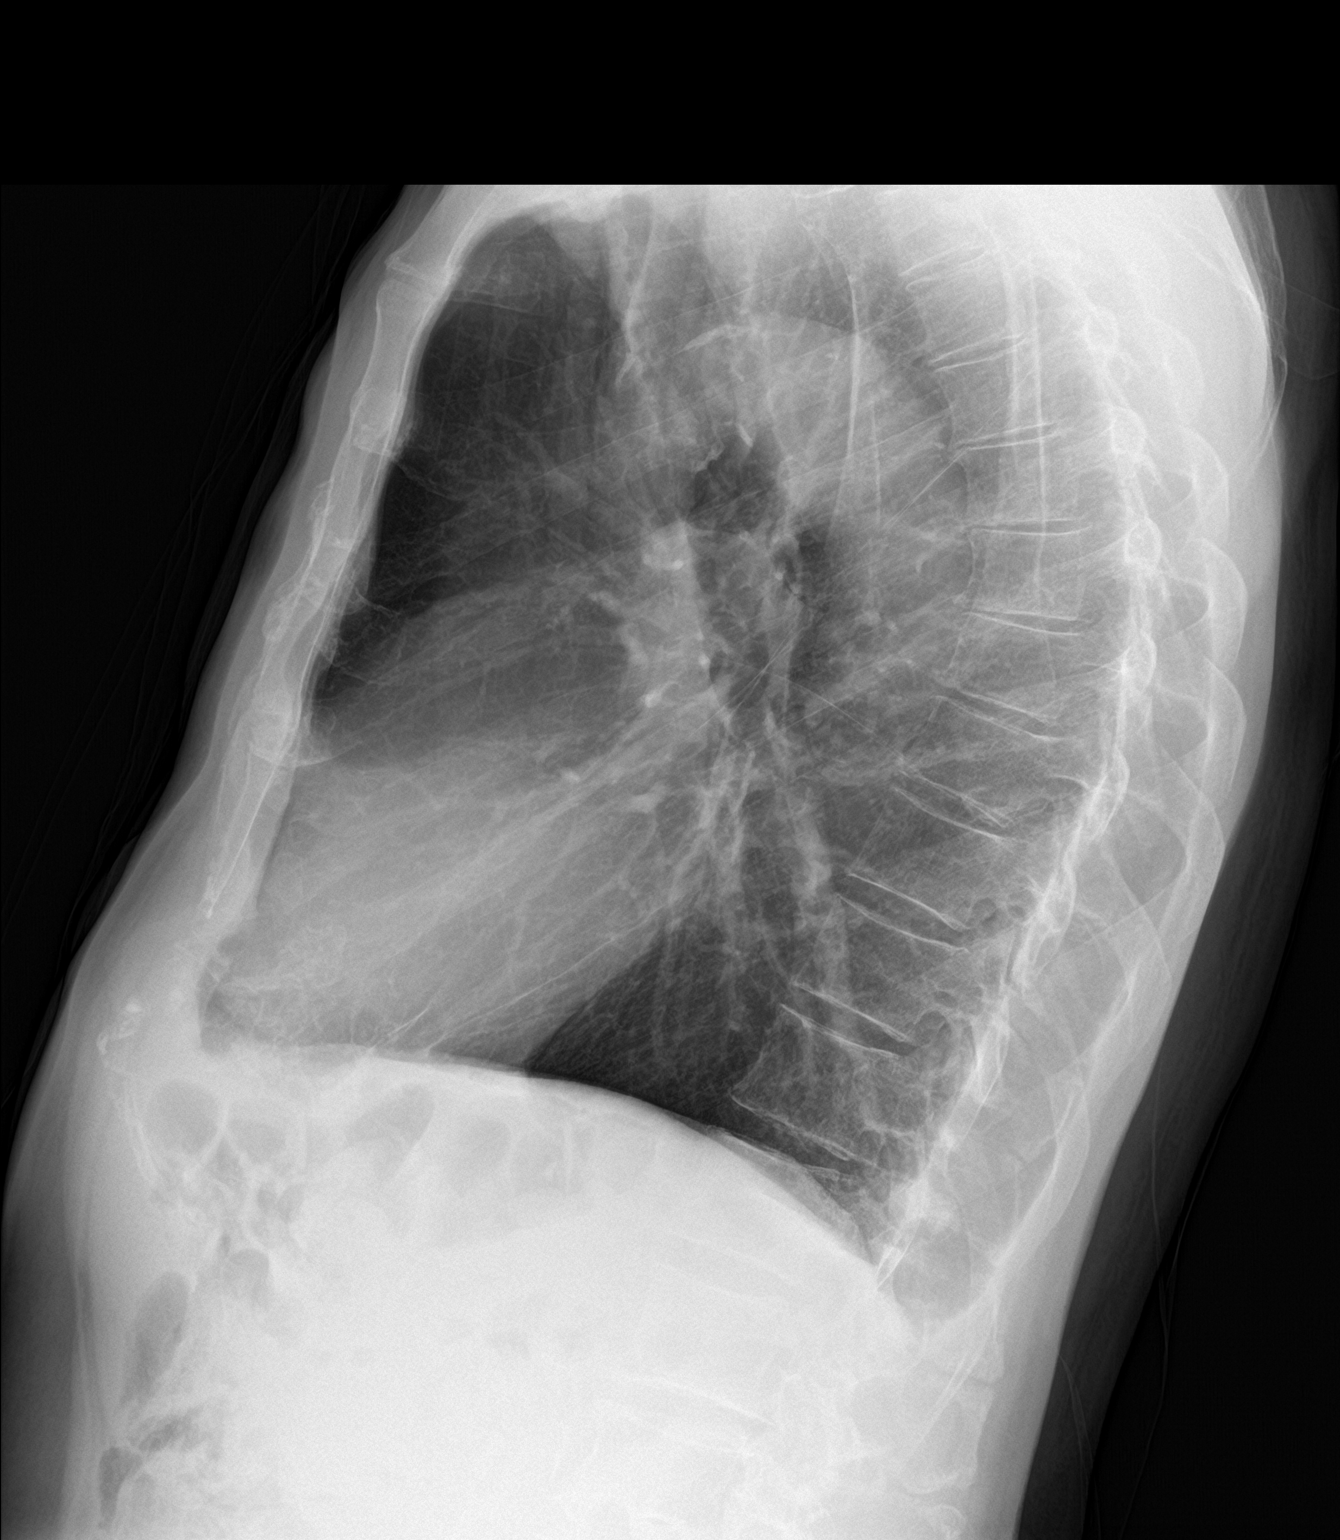

[2 of 2 positions shown; findings below may reference images not displayed]

FINDINGS: The lungs are clear wiithout focal pneumonia, edema, pneumothorax or
pleural effusion. Hyperexpansion suggests emphysema.
Cardiopericardial silhouette is at upper limits of normal for size.
The visualized bony structures of the thorax are intact. Possible 1
cm nodule right lung base.
IMPRESSION: Emphysema

**An incidental finding of potential clinical significance has been
found. 1 cm right lower lobe nodule. CT chest without contrast
recommended to further evaluate.**

These results will be called to the ordering clinician or
representative by the Radiologist Assistant, and communication
documented in the PACS or zVision Dashboard.

## 2018-08-01 ENCOUNTER — Ambulatory Visit: Payer: Medicare Other | Admitting: Nurse Practitioner

## 2018-08-01 ENCOUNTER — Ambulatory Visit (INDEPENDENT_AMBULATORY_CARE_PROVIDER_SITE_OTHER): Payer: Medicare Other | Admitting: Nurse Practitioner

## 2018-08-01 ENCOUNTER — Encounter: Payer: Self-pay | Admitting: Nurse Practitioner

## 2018-08-01 VITALS — BP 108/78 | HR 64 | Temp 97.6°F | Ht 67.0 in | Wt 126.4 lb

## 2018-08-01 DIAGNOSIS — N138 Other obstructive and reflux uropathy: Secondary | ICD-10-CM

## 2018-08-01 DIAGNOSIS — F0390 Unspecified dementia without behavioral disturbance: Secondary | ICD-10-CM

## 2018-08-01 DIAGNOSIS — N401 Enlarged prostate with lower urinary tract symptoms: Secondary | ICD-10-CM | POA: Diagnosis not present

## 2018-08-01 DIAGNOSIS — R634 Abnormal weight loss: Secondary | ICD-10-CM

## 2018-08-01 DIAGNOSIS — N183 Chronic kidney disease, stage 3 unspecified: Secondary | ICD-10-CM

## 2018-08-01 DIAGNOSIS — E538 Deficiency of other specified B group vitamins: Secondary | ICD-10-CM

## 2018-08-01 DIAGNOSIS — R5381 Other malaise: Secondary | ICD-10-CM

## 2018-08-01 NOTE — Progress Notes (Signed)
Careteam: [name], NP as PCP - General (Geriatric Medicine) [name], MD as Consulting Physician (Ophthalmology) [name], MD as Consulting Physician (Neurology) [name], MD as Consulting Physician (Pulmonary Disease)  Advanced Directive information Does Patient Have a Medical Advance Directive?: Yes, Type of Advance Directive: [place], Does patient want to make changes to medical advance directive?: No - Patient declined  No Known Allergies  Chief Complaint  Patient presents with  . Medical Management of Chronic Issues    2 month follow up with weight loss adn dementia, here with wife [name]     HPI: Patient is a 85 y.o. male seen in the office today for routine follow up.   Dementia- he has been evaluated by neurology. MMSE 21/30. memantine offered at neurology apt but decided to hold off at this time. Memory has been stable. A little worse when he was in the hospital but better at home.  Needing help with dressing and bathing.  No anxiety or agitation.   He saw the liver clinic and imaging revealed cyst but drainage not recommended. MRI revealed large right hepatic lobe hemorrhagic cyst (12.4 x 9.2 x 11.5 cm); per liver clinic, low likelihood of malignancy;f/u MRI in May 2019 showed stable right hepatic lobe complex/hemorrhagic cyst 11cm/stable renal cysts/stable distended bladder with moderate trabeculation likely due to chronic bladder outlet obstruction and liver clinic recommends no further w/u.LFTs nml; alk phos 198- last visit in December.   NSVT - followed by cardio. Last OV 05/28/18,  family does not desire aggressive care.  CKD - stage 3. Cr 1.22 on 07/17/2018  B12 deficiency - improved.no longer on supplements.   Urinary retention- had TURP surgery  07/16/2018. However still requiring chronic foley, unable to go without. Following with urologist. Next appt next week.   HTN - diet  controlled.  Weight loss- appetite previously decrease has now improved, wife reports they are continuing protein supplements. He had lost 20 lbs in 6 month, currently weight is stable since last visit ( 2 months)   Debility- after multiple hospitalization and TURP. He has home health set up.    Review of Systems:  Review of Systems  Unable to perform ROS: Dementia    Past Medical History:  Diagnosis Date  . BPH (benign prostatic hypertrophy)   . Cataract    Both  . Dementia ([place])   . Diverticula of colon   . Elevated lactic acid level   . Gallbladder polyp   . Glaucoma 1986   Dr. Mila Merry  . Gram negative sepsis ([place])   . Hearing loss   . Hx of adenomatous colonic polyps   . Internal hemorrhoids   . Left varicocele   . Liver cyst   . Obesity   . Pseudodementia   . RBBB (right bundle branch block)    Past Surgical History:  Procedure Laterality Date  . CATARACT EXTRACTION Right   . GLAUCOMA REPAIR Left   . TRANSURETHRAL RESECTION OF PROSTATE  07/16/2018   [facility]   Social History:   reports that he quit smoking about 62 years ago. His smoking use included cigarettes. He has a 3.00 pack-year smoking history. He has never used smokeless tobacco. He reports that he does not drink alcohol or use drugs.  Family History  Problem Relation Age of Onset  . Heart disease Father 44  . Parkinson's disease Father   . Cancer Mother        ?  type  . Colon cancer Neg Hx     Medications: Patient's Medications  New Prescriptions   No medications on file  Previous Medications   ASPIRIN EC 81 MG TABLET    Take 81 mg by mouth daily.   BRIMONIDINE (ALPHAGAN) 0.2 % OPHTHALMIC SOLUTION    Place 1 drop into the left eye Twice daily.    CEFDINIR (OMNICEF) 300 MG CAPSULE    Take 300 mg by mouth 2 (two) times daily.   CLOTRIMAZOLE (LOTRIMIN) 1 % CREAM    Apply 1 application topically 2 (two) times daily as needed.   DORZOLAMIDE-TIMOLOL (COSOPT) 22.3-6.8 MG/ML OPHTHALMIC  SOLUTION    Place 1 drop into the left eye 2 (two) times daily.   FAMOTIDINE (PEPCID) 20 MG TABLET    Take 20 mg by mouth at bedtime as needed for heartburn or indigestion.   MISC. DEVICES (COMMODE 3-IN-1) MISC    Use 3-in-1 bedside commode daily as needed   MONTELUKAST (SINGULAIR) 5 MG CHEWABLE TABLET    CHEW 1 TABLET (5 MG TOTAL) BY MOUTH AT BEDTIME.   MULTIPLE VITAMIN (MULTIVITAMIN) TABLET    Take 1 tablet by mouth daily.  Modified Medications   No medications on file  Discontinued Medications   NITROFURANTOIN, MACROCRYSTAL-MONOHYDRATE, (MACROBID) 100 MG CAPSULE    1 cap by mouth twice daily for 10 days   TAMSULOSIN (FLOMAX) 0.4 MG CAPS CAPSULE    Take 4 mg by mouth daily.     Physical Exam:  Vitals:   08/01/18 1026  BP: 108/78  Pulse: 64  Temp: 97.6 F (36.4 C)  TempSrc: Oral  SpO2: 100%  Weight: 126 lb 6.4 oz (57.3 kg)  Height: _0  (1.702 m)   Body mass index is 19.8 kg/m.  Physical Exam Constitutional:      Appearance: He is well-developed.     Comments: Thin frail male, NAD, incontinent of urine  Eyes:     General: No scleral icterus.    Pupils: Pupils are equal, round, and reactive to light.     Comments: OS corneal clouding  Neck:     Musculoskeletal: Neck supple.     Thyroid: No thyromegaly.     Vascular: No carotid bruit.  Cardiovascular:     Rate and Rhythm: Normal rate and regular rhythm.     Heart sounds: Murmur (3/6 SEM) present. No friction rub. No gallop.      Comments: no distal LE swelling. No calf TTP Pulmonary:     Effort: Pulmonary effort is normal.     Breath sounds: Normal breath sounds. No wheezing or rales.  Chest:     Chest wall: No tenderness.  Abdominal:     General: Bowel sounds are normal. There is no distension or abdominal bruit.     Palpations: Abdomen is soft. There is no hepatomegaly, mass or pulsatile mass.     Tenderness: There is no abdominal tenderness. There is no guarding or rebound.  Lymphadenopathy:     Cervical: No  cervical adenopathy.  Skin:    General: Skin is warm and dry.     Findings: No rash.  Neurological:     Mental Status: He is alert.     Deep Tendon Reflexes: Reflexes are normal and symmetric.     Labs reviewed: Basic Metabolic Panel: Recent Labs    01/15/18 1218  04/05/18 04/16/18 1408 05/03/18 1127 05/28/18 1631  NA 138   < > 138 136 139  --   K 4.5   < >  3.2* 3.7 3.5  --   CL 104  --   --  99 101  --   CO2 22  --   --  30 31  --   GLUCOSE 88  --   --  114 91  --   BUN 26*   < > _0 --   CREATININE 1.30*   < > 1.5* 2.01* 1.18*  --   CALCIUM 9.5  --   --  8.8 9.0  --   TSH  --   --   --   --   --  4.520*   < > = values in this interval not displayed.   Liver Function Tests: Recent Labs    08/09/17 01/15/18 1218 04/02/18 04/16/18 1408  AST _1 ALT _2 ALKPHOS 198*  --  179*  --   BILITOT  --  0.6  --  0.6  PROT  --  6.8  --  6.4   No results for input(s): LIPASE, AMYLASE in the last 8760 hours. No results for input(s): AMMONIA in the last 8760 hours. CBC: Recent Labs    04/02/18 04/05/18 04/16/18 1408  WBC 2.6 9.9 5.6  NEUTROABS  --   --  4,217  HGB 12.6* 10.5* 11.4*  HCT 39* 31* 33.2*  MCV  --   --  87.4  PLT  --  88* 430*   Lipid Panel: No results for input(s): CHOL, HDL, LDLCALC, TRIG, CHOLHDL, LDLDIRECT in the last 8760 hours. TSH: Recent Labs    05/28/18 1631  TSH 4.520*   A1C: No results found for: HGBA1C   Assessment/Plan 1. Dementia without behavioral disturbance, unspecified dementia type ([place]) Had acute decline during multiple hospitalizations. Currently has been stable. Living at home with wife.   2. Benign prostatic hyperplasia with urinary obstruction S/p TURP, continues to have problems with emptying bladder and has chronic foley  3. Stage 3 chronic kidney disease ([place]) -Encourage proper hydration and to avoid NSAIDS (Aleve, Advil, Motrin, Ibuprofen)  - COMPLETE METABOLIC PANEL WITH GFR; Future  4. Weight  loss Stable at this time. Continue on protein supplement and liberalized diet.  5. Debility -has improved since hospitalization. Continues with home health PT/OT  6. B12 deficiency - Vitamin B12; Future - CBC with Differential/Platelet; Future  Next appt: 3 months with labs prior  Gracyn Allor K. [place], [facility] [phone]

## 2018-08-01 NOTE — Patient Instructions (Addendum)
Work on getting enough calories during the day. Continue supplement twice daily  Follow up in 3 months with lab work before visit.

## 2018-08-07 DIAGNOSIS — N183 Chronic kidney disease, stage 3 (moderate): Secondary | ICD-10-CM

## 2018-08-07 DIAGNOSIS — I129 Hypertensive chronic kidney disease with stage 1 through stage 4 chronic kidney disease, or unspecified chronic kidney disease: Secondary | ICD-10-CM | POA: Diagnosis not present

## 2018-08-07 DIAGNOSIS — I4510 Unspecified right bundle-branch block: Secondary | ICD-10-CM

## 2018-08-07 DIAGNOSIS — Z466 Encounter for fitting and adjustment of urinary device: Secondary | ICD-10-CM

## 2018-08-07 DIAGNOSIS — F0390 Unspecified dementia without behavioral disturbance: Secondary | ICD-10-CM

## 2018-08-07 DIAGNOSIS — N401 Enlarged prostate with lower urinary tract symptoms: Secondary | ICD-10-CM

## 2018-08-07 DIAGNOSIS — K7689 Other specified diseases of liver: Secondary | ICD-10-CM

## 2018-08-07 DIAGNOSIS — R338 Other retention of urine: Secondary | ICD-10-CM

## 2018-09-04 ENCOUNTER — Telehealth: Payer: Self-pay | Admitting: *Deleted

## 2018-09-04 DIAGNOSIS — Z029 Encounter for administrative examinations, unspecified: Secondary | ICD-10-CM

## 2018-09-04 NOTE — Telephone Encounter (Signed)
Received FMLA Paperwork for daughter Forde Dandy [phone] [place]  [phone] Fax: [phone]  Please call daughter when ready for pick up. Placed paperwork in [place] folder to fill out and sign.

## 2018-10-01 ENCOUNTER — Telehealth: Payer: Self-pay | Admitting: *Deleted

## 2018-10-01 NOTE — Telephone Encounter (Signed)
Wife stated that they could not get patient into the office. Stated that it is a struggle just to get him up out of bed.   Wants to confirm giving him Tylenol is ok. Please Advise.

## 2018-10-01 NOTE — Telephone Encounter (Signed)
Agreeable to continue PT and nursing.   Unsure what they want antibiotics for? No fever? The left knee is currently swollen however no redness or warmth currently. Would not give ibuprofen due to his CKD. Is his knee painful? Is it getting worse or better?   He has hx of frequent UTI, would question if this is not causing some issue and he is being followed by urologist. When was his last catheter change?

## 2018-10-01 NOTE — Telephone Encounter (Signed)
Yes can give tylenol 500 mg 2 tablets every 8 hours as needed for pain

## 2018-10-01 NOTE — Telephone Encounter (Signed)
We can see him in office in the AM if he is willing to make an appt (can see me or dinah)

## 2018-10-01 NOTE — Telephone Encounter (Signed)
Spoke with [name] and she stated that the knee is painful and worse. Stated that patient is independent getting up and now he has to have help due to the knee. No Trauma, No falls.   Had his catheter changed last week in the ER due to bright red blood. Urine had been fine this week.   Nurse will notify family of NOT taking any Ibuprofen.

## 2018-10-01 NOTE — Telephone Encounter (Signed)
Katie with [facility] called wanting verbal orders to continue PT and Skilled Nursing.  Verbal orders given.  Nurse also stated that patient has had change in condition. Started Wednesday with Swollen left knee with warmth. No Trauma. No warmth to touch or redness today. Increased confusion and not eating well. Has been giving Tylenol.  No Temp. Can Ibuprofen be given and does he need antibiotic. Please Advise.

## 2018-10-02 ENCOUNTER — Other Ambulatory Visit: Payer: Self-pay

## 2018-10-02 ENCOUNTER — Ambulatory Visit (INDEPENDENT_AMBULATORY_CARE_PROVIDER_SITE_OTHER): Payer: Medicare Other | Admitting: Family

## 2018-10-02 ENCOUNTER — Encounter: Payer: Self-pay | Admitting: Family

## 2018-10-02 VITALS — Temp 96.4°F

## 2018-10-02 DIAGNOSIS — M25562 Pain in left knee: Secondary | ICD-10-CM

## 2018-10-02 DIAGNOSIS — R531 Weakness: Secondary | ICD-10-CM

## 2018-10-02 DIAGNOSIS — R638 Other symptoms and signs concerning food and fluid intake: Secondary | ICD-10-CM

## 2018-10-02 NOTE — Progress Notes (Signed)
This service is provided via telemedicine  No vital signs collected/recorded due to the encounter was a telemedicine visit.   Location of patient (ex: home, work):  Home  Patient consents to a telephone visit:  Yes  Location of the provider (ex: office, home):  Office   Name of any referring provider:  Sherrie Mustache NP  Names of all persons participating in the telemedicine service and their role in the encounter:  Ruthell Rummage CMA, Dinah Ngetich NP, Judyann Munson and wife [name].   Time spent on call:  Ruthell Rummage CMA spent 13  Minutes on phone with patient     Location[facility]    Place of Service:   [facility]  Provider: Marlowe Sax, NP  Code Status: FULL Goals of Care:  Advanced Directives 10/02/2018  Does Patient Have a Medical Advance Directive? Yes  Type of Advance Directive [place]  Does patient want to make changes to medical advance directive? No - Patient declined  Copy of [place] in Chart? No - copy requested     Chief Complaint  Patient presents with  . Acute Visit    Left Knee Pain and swelling duration friday of last week, patient went to ER last tuesday due to prob with cath replacement.     HPI: Patient is a 85 y.o. male seen today for acute visit for evaluation of left knee pain.HPI information provided by patient's wife who states patient has had worsening left knee swelling and pain.she has also noticed a bruise on patient's knee.she states patient unable to walk due to pain.Patient usually able to walk without any assistance.He has taken Tylenol for pain without any improvement.Patient denies any fall episode, injury to knee or  increased warmth.No history of arthritis or gout.   Generalized weakness - wife states unable to assist patient by herself due to weakness.Has home health Nurse that follows up.He was seen by [facility] 10/01/2018 no fever or abnormal vital signs.Also had clear lungs.  Poor appetite  - drunk boost for breakfast but did not eat lunch.Has been drinking water.  Foley catheter - wife reports patient had a foley catheter changed 09/26/2018 by [facility] but tracked incorrectly.Second one was re-inserted but patient had bleeding.Patient was evaluated in the ED.Foley catheter was inserted.No bleeding since then but has had generalized weakness.He has a follow up phone visit with Dr.Hall later today.   Past Medical History:  Diagnosis Date  . BPH (benign prostatic hypertrophy)   . Cataract    Both  . Dementia ([place])   . Diverticula of colon   . Elevated lactic acid level   . Gallbladder polyp   . Glaucoma 1986   Dr. Mila Merry  . Gram negative sepsis ([place])   . Hearing loss   . Hx of adenomatous colonic polyps   . Internal hemorrhoids   . Left varicocele   . Liver cyst   . Obesity   . Pseudodementia   . RBBB (right bundle branch block)     Past Surgical History:  Procedure Laterality Date  . CATARACT EXTRACTION Right   . GLAUCOMA REPAIR Left   . TRANSURETHRAL RESECTION OF PROSTATE  07/16/2018   [facility]    No Known Allergies  Outpatient Encounter Medications as of 10/02/2018  Medication Sig  . acetaminophen (TYLENOL) 500 MG tablet Take 500 mg by mouth as needed.  Marland Kitchen aspirin EC 81 MG tablet Take 81 mg by mouth daily.  . brimonidine (  ALPHAGAN) 0.2 % ophthalmic solution Place 1 drop into the left eye Twice daily.   . clotrimazole (LOTRIMIN) 1 % cream Apply 1 application topically 2 (two) times daily as needed.  . dorzolamide-timolol (COSOPT) 22.3-6.8 MG/ML ophthalmic solution Place 1 drop into the left eye 2 (two) times daily.  . famotidine (PEPCID) 20 MG tablet Take 20 mg by mouth at bedtime as needed for heartburn or indigestion.  . Misc. Devices (COMMODE 3-IN-1) MISC Use 3-in-1 bedside commode daily as needed  . montelukast (SINGULAIR) 5 MG chewable tablet CHEW 1 TABLET (5 MG TOTAL) BY MOUTH AT BEDTIME.  . Multiple Vitamin (MULTIVITAMIN) tablet Take 1  tablet by mouth daily.   No facility-administered encounter medications on file as of 10/02/2018.     Review of Systems:  Review of Systems  Constitutional: Positive for appetite change. Negative for chills and fever.       Very weak since 09/26/2018 and seems to be worsening.  HENT: Positive for rhinorrhea. Negative for congestion, sinus pressure, sinus pain, sneezing and sore throat.   Eyes: Positive for visual disturbance. Negative for discharge, redness and itching.       Hx glaucoma   Respiratory: Negative for cough, chest tightness, shortness of breath and wheezing.   Cardiovascular: Negative for chest pain, palpitations and leg swelling.  Gastrointestinal: Negative for abdominal distention, abdominal pain, diarrhea, nausea and vomiting.  Genitourinary: Negative for flank pain and urgency.       Foley catheter changed 09/26/2018 in the ED.wife states foley catheter draining adequate amount of urine.she noticed small amounts of blood on penis earlier today but has been draining clear yellow urine.   Musculoskeletal: Positive for arthralgias and joint swelling.       Left knee swollen, red and painful.unable to walk.  Skin: Negative for color change, pallor and rash.  Neurological: Negative for dizziness, light-headedness and headaches.       Generalized weakness   Psychiatric/Behavioral: Negative for agitation, confusion and sleep disturbance.    Health Maintenance  Topic Date Due  . INFLUENZA VACCINE  01/11/2019  . TETANUS/TDAP  12/25/2021  . PNA vac Low Risk Adult  Completed    Physical Exam: Vitals:   10/02/18 1341  Temp: (!) 96.4 F (35.8 C)   There is no height or weight on file to calculate BMI.wife checked Temperature at home but does not have a B/P cuff.   Physical Exam Unable to complete physical exam on the phone visit.   Labs reviewed: Basic Metabolic Panel: Recent Labs    01/15/18 1218  04/05/18 04/16/18 1408 05/03/18 1127 05/28/18 1631  NA 138   < >  138 136 139  --   K 4.5   < > 3.2* 3.7 3.5  --   CL 104  --   --  99 101  --   CO2 22  --   --  30 31  --   GLUCOSE 88  --   --  114 91  --   BUN 26*   < > 14 18 8   --   CREATININE 1.30*   < > 1.5* 2.01* 1.18*  --   CALCIUM 9.5  --   --  8.8 9.0  --   TSH  --   --   --   --   --  4.520*   < > = values in this interval not displayed.   Liver Function Tests: Recent Labs    01/15/18 1218 04/02/18 04/16/18 1408  AST  22 21 22   ALT 9 11 11   ALKPHOS  --  179*  --   BILITOT 0.6  --  0.6  PROT 6.8  --  6.4   CBC: Recent Labs    04/02/18 04/05/18 04/16/18 1408  WBC 2.6 9.9 5.6  NEUTROABS  --   --  4,217  HGB 12.6* 10.5* 11.4*  HCT 39* 31* 33.2*  MCV  --   --  87.4  PLT  --  88* 430*    Procedures since last visit: No results found.  Assessment/Plan 1. Acute pain of left knee Afebrile.Worsening redness,swelling,pain and impaired mobility.Recommended evaluation in ED for imaging.   2. Generalized weakness Worsening weakness possible due to poor oral intake.Recommeded evaluation in the ED to rule out infectious and metabolic etiologies.    3. Poor fluid intake Drunk a boost for breakfast but did not eat lunch.ED  Recommended to prevent deconditioning.  Labs/tests ordered: None.Recommended ED for further evaluation as above.  Next appt:  10/24/2018  Time spent with patient 25 minutes >50% time spent counseling; reviewing medical record; tests; labs; and developing future plan of care.

## 2018-10-02 NOTE — Telephone Encounter (Signed)
Patient wife notified and agreed. Stated that she is going to see if she can get someone to help her get him here and call us back for an appointment.

## 2018-10-03 ENCOUNTER — Telehealth: Payer: Self-pay | Admitting: *Deleted

## 2018-10-03 DIAGNOSIS — M009 Pyogenic arthritis, unspecified: Secondary | ICD-10-CM | POA: Insufficient documentation

## 2018-10-03 NOTE — Telephone Encounter (Signed)
I had a telephone visit yesterday and spoke with patient and wife recommended for patient to go to the ED for evaluation of knee pain,generalized weakness and poor oral intake.

## 2018-10-03 NOTE — Telephone Encounter (Signed)
Spoke with patient's wife and advised results  

## 2018-10-03 NOTE — Telephone Encounter (Signed)
Pt wife is calling stating that they where told to get a xray today for pt's knee, pt wife said she spoke with Dinah yesterday 10/02/2018 and [name] 10/01/2018 about pt's pain in his knee, [name] suggested a office visit but then pt was told they couldn't come in. Pt's wife got her son off his job to bring them for the xray, should this be done or not???  Please see phone note 10/01/2018.

## 2018-10-06 DIAGNOSIS — Z466 Encounter for fitting and adjustment of urinary device: Secondary | ICD-10-CM

## 2018-10-06 DIAGNOSIS — I4510 Unspecified right bundle-branch block: Secondary | ICD-10-CM

## 2018-10-06 DIAGNOSIS — R338 Other retention of urine: Secondary | ICD-10-CM

## 2018-10-06 DIAGNOSIS — N183 Chronic kidney disease, stage 3 (moderate): Secondary | ICD-10-CM

## 2018-10-06 DIAGNOSIS — N401 Enlarged prostate with lower urinary tract symptoms: Secondary | ICD-10-CM

## 2018-10-06 DIAGNOSIS — F0390 Unspecified dementia without behavioral disturbance: Secondary | ICD-10-CM

## 2018-10-06 DIAGNOSIS — K7689 Other specified diseases of liver: Secondary | ICD-10-CM

## 2018-10-06 DIAGNOSIS — I129 Hypertensive chronic kidney disease with stage 1 through stage 4 chronic kidney disease, or unspecified chronic kidney disease: Secondary | ICD-10-CM

## 2018-10-08 MED ORDER — MONTELUKAST SODIUM 10 MG PO TABS
5.00 | ORAL_TABLET | ORAL | Status: DC
Start: 2018-10-08 — End: 2018-10-08

## 2018-10-08 MED ORDER — DOCUSATE SODIUM 100 MG PO CAPS
100.00 | ORAL_CAPSULE | ORAL | Status: DC
Start: ? — End: 2018-10-08

## 2018-10-08 MED ORDER — POLYETHYLENE GLYCOL 3350 17 G PO PACK
17.00 | PACK | ORAL | Status: DC
Start: 2018-10-09 — End: 2018-10-08

## 2018-10-08 MED ORDER — CIPROFLOXACIN HCL 500 MG PO TABS
500.00 | ORAL_TABLET | ORAL | Status: DC
Start: 2018-10-08 — End: 2018-10-08

## 2018-10-08 MED ORDER — ALUMINUM-MAGNESIUM-SIMETHICONE 200-200-20 MG/5ML PO SUSP
30.00 | ORAL | Status: DC
Start: ? — End: 2018-10-08

## 2018-10-08 MED ORDER — BENZONATATE 100 MG PO CAPS
100.00 | ORAL_CAPSULE | ORAL | Status: DC
Start: ? — End: 2018-10-08

## 2018-10-08 MED ORDER — ZOLPIDEM TARTRATE 5 MG PO TABS
2.50 | ORAL_TABLET | ORAL | Status: DC
Start: ? — End: 2018-10-08

## 2018-10-08 MED ORDER — SODIUM CHLORIDE FLUSH 0.9 % IV SOLN
5.00 | INTRAVENOUS | Status: DC
Start: 2018-10-08 — End: 2018-10-08

## 2018-10-08 MED ORDER — PIPERACILLIN SODIUM
10.00 | Status: DC
Start: ? — End: 2018-10-08

## 2018-10-08 MED ORDER — GENERIC EXTERNAL MEDICATION
1.00 | Status: DC
Start: 2018-10-09 — End: 2018-10-08

## 2018-10-08 MED ORDER — ENOXAPARIN SODIUM 40 MG/0.4ML ~~LOC~~ SOLN
40.00 | SUBCUTANEOUS | Status: DC
Start: 2018-10-08 — End: 2018-10-08

## 2018-10-08 MED ORDER — OXYCODONE HCL 5 MG PO TABS
5.00 | ORAL_TABLET | ORAL | Status: DC
Start: ? — End: 2018-10-08

## 2018-10-08 MED ORDER — PROMETHAZINE HCL 25 MG PO TABS
12.50 | ORAL_TABLET | ORAL | Status: DC
Start: ? — End: 2018-10-08

## 2018-10-08 MED ORDER — BRIMONIDINE TARTRATE 0.2 % OP SOLN
1.00 | OPHTHALMIC | Status: DC
Start: 2018-10-08 — End: 2018-10-08

## 2018-10-08 MED ORDER — ACETAMINOPHEN 325 MG PO TABS
650.00 | ORAL_TABLET | ORAL | Status: DC
Start: ? — End: 2018-10-08

## 2018-10-08 MED ORDER — ONDANSETRON HCL 4 MG/2ML IJ SOLN
4.00 | INTRAMUSCULAR | Status: DC
Start: ? — End: 2018-10-08

## 2018-10-08 MED ORDER — SODIUM CHLORIDE FLUSH 0.9 % IV SOLN
5.00 | INTRAVENOUS | Status: DC
Start: ? — End: 2018-10-08

## 2018-10-08 MED ORDER — FAMOTIDINE 20 MG PO TABS
20.00 | ORAL_TABLET | ORAL | Status: DC
Start: ? — End: 2018-10-08

## 2018-10-08 MED ORDER — DORZOLAMIDE HCL-TIMOLOL MAL 22.3-6.8 MG/ML OP SOLN
1.00 | OPHTHALMIC | Status: DC
Start: 2018-10-08 — End: 2018-10-08

## 2018-10-08 MED ORDER — ASPIRIN EC 81 MG PO TBEC
81.00 | DELAYED_RELEASE_TABLET | ORAL | Status: DC
Start: 2018-10-09 — End: 2018-10-08

## 2018-10-10 ENCOUNTER — Telehealth: Payer: Self-pay | Admitting: *Deleted

## 2018-10-10 NOTE — Telephone Encounter (Signed)
Thank you :)

## 2018-10-10 NOTE — Telephone Encounter (Signed)
Checked Care Everywhere and patient was in [facility] 4/23-4/28 for Knee pain.   I called and spoke with wife and she stated that patient was discharged into the [facility] on the 28th and still there. She is unsure when he will be released home but will call us to schedule a follow up once he is discharged.

## 2018-10-10 NOTE — Telephone Encounter (Signed)
-----   Message from [name], NP sent at 10/09/2018  1:39 PM EDT ----- It looks like he is getting discharged from another hospital today (can u pull it up in care everywhere?), maybe call tomorrow for TOC, I could not fwd the discharge summary. Thanks  [name]

## 2018-10-18 ENCOUNTER — Telehealth: Payer: Self-pay | Admitting: Nurse Practitioner

## 2018-10-18 MED ORDER — POLYETHYLENE GLYCOL 3350 17 G PO PACK
17.00 | PACK | ORAL | Status: DC
Start: ? — End: 2018-10-18

## 2018-10-18 MED ORDER — ONDANSETRON 4 MG PO TBDP
4.00 | ORAL_TABLET | ORAL | Status: DC
Start: ? — End: 2018-10-18

## 2018-10-18 MED ORDER — ENOXAPARIN SODIUM 40 MG/0.4ML ~~LOC~~ SOLN
40.00 | SUBCUTANEOUS | Status: DC
Start: 2018-10-18 — End: 2018-10-18

## 2018-10-18 MED ORDER — ALUMINUM-MAGNESIUM-SIMETHICONE 200-200-20 MG/5ML PO SUSP
30.00 | ORAL | Status: DC
Start: ? — End: 2018-10-18

## 2018-10-18 MED ORDER — DORZOLAMIDE HCL-TIMOLOL MAL 22.3-6.8 MG/ML OP SOLN
OPHTHALMIC | Status: DC
Start: 2018-10-18 — End: 2018-10-18

## 2018-10-18 MED ORDER — CIPROFLOXACIN HCL 500 MG PO TABS
500.00 | ORAL_TABLET | ORAL | Status: DC
Start: 2018-10-18 — End: 2018-10-18

## 2018-10-18 MED ORDER — GENERIC EXTERNAL MEDICATION
1.00 | Status: DC
Start: 2018-10-19 — End: 2018-10-18

## 2018-10-18 MED ORDER — FAMOTIDINE 20 MG PO TABS
20.00 | ORAL_TABLET | ORAL | Status: DC
Start: ? — End: 2018-10-18

## 2018-10-18 MED ORDER — MONTELUKAST SODIUM 10 MG PO TABS
5.00 | ORAL_TABLET | ORAL | Status: DC
Start: 2018-10-18 — End: 2018-10-18

## 2018-10-18 MED ORDER — BRIMONIDINE TARTRATE 0.2 % OP SOLN
1.00 | OPHTHALMIC | Status: DC
Start: 2018-10-18 — End: 2018-10-18

## 2018-10-18 MED ORDER — BISACODYL 10 MG RE SUPP
10.00 | RECTAL | Status: DC
Start: ? — End: 2018-10-18

## 2018-10-18 MED ORDER — ZOLPIDEM TARTRATE 5 MG PO TABS
2.50 | ORAL_TABLET | ORAL | Status: DC
Start: ? — End: 2018-10-18

## 2018-10-18 MED ORDER — METHYLPHENIDATE HCL POWD
25.00 | Status: DC
Start: ? — End: 2018-10-18

## 2018-10-18 MED ORDER — ACETAMINOPHEN 325 MG PO TABS
650.00 | ORAL_TABLET | ORAL | Status: DC
Start: 2018-10-18 — End: 2018-10-18

## 2018-10-18 MED ORDER — ASPIRIN EC 81 MG PO TBEC
81.00 | DELAYED_RELEASE_TABLET | ORAL | Status: DC
Start: 2018-10-19 — End: 2018-10-18

## 2018-10-18 MED ORDER — LURASIDONE HCL 40 MG PO TABS
100.00 | ORAL_TABLET | ORAL | Status: DC
Start: ? — End: 2018-10-18

## 2018-10-18 MED ORDER — POLYSACCHARIDE IRON COMPLEX 150 MG PO CAPS
150.00 | ORAL_CAPSULE | ORAL | Status: DC
Start: 2018-10-18 — End: 2018-10-18

## 2018-10-18 MED ORDER — PANTOPRAZOLE SODIUM 40 MG PO TBEC
40.00 | DELAYED_RELEASE_TABLET | ORAL | Status: DC
Start: 2018-10-19 — End: 2018-10-18

## 2018-10-18 MED ORDER — SENNOSIDES-DOCUSATE SODIUM 8.6-50 MG PO TABS
2.00 | ORAL_TABLET | ORAL | Status: DC
Start: ? — End: 2018-10-18

## 2018-10-18 NOTE — Telephone Encounter (Signed)
Pt was admitted & surgery on knee 4/23 &  transferred to inpt rehab ctr4/28 & d/c 10/18/18 at [facility].  Wife is concerned that he has appt for labs 5/14 & 32mth appt on 5/19, she wants to know if they still need to come. She's concerned about transportation & traveling from [place] at their age.  Thanks, Vilinda Blanks.

## 2018-10-18 NOTE — Telephone Encounter (Signed)
Patient wife stated that son is going to bring them to appointments.     Transition Care Management Follow-up Telephone Call  Date of discharge and from where: 10/18/2018 [facility]  How have you been since you were released from the hospital? better  Any questions or concerns? No   Items Reviewed:  Did the pt receive and understand the discharge instructions provided? Yes   Medications obtained and verified? Yes   Any new allergies since your discharge? No   Dietary orders reviewed? Yes  Do you have support at home? Yes  Wife and son  Other (ie: DME, Home Health, etc) [place]: (I = Independent and D = Dependent) ADL's: I with Assistance Risk manager)  Bathing/Dressing- I with Assistance   Meal Prep- D Wife and Son  Eating- I  Maintaining continence- I  Transferring/Ambulation- I with assistance [place]  Managing Meds- I   Follow up appointments reviewed:    PCP Hospital f/u appt confirmed? Yes  Scheduled to see [name] on 10/29/18   Specialist Hospital f/u appt confirmed? Yes  Scheduled to see Orthopaedic on 10/29/18 .  Are transportation arrangements needed? No   If their condition worsens, is the pt aware to call  their PCP or go to the ED? Yes  Was the patient provided with contact information for the PCP's office or ED? Yes  Was the pt encouraged to call back with questions or concerns? Yes

## 2018-10-18 NOTE — Telephone Encounter (Signed)
Thank you :)

## 2018-10-22 DIAGNOSIS — N183 Chronic kidney disease, stage 3 (moderate): Secondary | ICD-10-CM

## 2018-10-22 DIAGNOSIS — I129 Hypertensive chronic kidney disease with stage 1 through stage 4 chronic kidney disease, or unspecified chronic kidney disease: Secondary | ICD-10-CM

## 2018-10-22 DIAGNOSIS — Z4789 Encounter for other orthopedic aftercare: Secondary | ICD-10-CM | POA: Diagnosis not present

## 2018-10-22 DIAGNOSIS — N400 Enlarged prostate without lower urinary tract symptoms: Secondary | ICD-10-CM

## 2018-10-22 DIAGNOSIS — M00862 Arthritis due to other bacteria, left knee: Secondary | ICD-10-CM

## 2018-10-22 DIAGNOSIS — R330 Drug induced retention of urine: Secondary | ICD-10-CM

## 2018-10-22 DIAGNOSIS — K5730 Diverticulosis of large intestine without perforation or abscess without bleeding: Secondary | ICD-10-CM

## 2018-10-22 DIAGNOSIS — F0390 Unspecified dementia without behavioral disturbance: Secondary | ICD-10-CM

## 2018-10-23 ENCOUNTER — Telehealth: Payer: Self-pay

## 2018-10-23 NOTE — Telephone Encounter (Signed)
Levada Dy with [facility] called requesting verbal orders for skilled nursing.  Per [employer] standing order, verbal order given. Message will be sent to patient's provider as a FYI.

## 2018-10-24 ENCOUNTER — Other Ambulatory Visit: Payer: Medicare Other

## 2018-10-24 ENCOUNTER — Other Ambulatory Visit: Payer: Self-pay

## 2018-10-24 DIAGNOSIS — E538 Deficiency of other specified B group vitamins: Secondary | ICD-10-CM

## 2018-10-24 DIAGNOSIS — N183 Chronic kidney disease, stage 3 unspecified: Secondary | ICD-10-CM

## 2018-10-25 LAB — CBC WITH DIFFERENTIAL/PLATELET
Absolute Monocytes: 462 cells/uL (ref 200–950)
Basophils Absolute: 27 cells/uL (ref 0–200)
Basophils Relative: 0.4 %
Eosinophils Absolute: 82 cells/uL (ref 15–500)
Eosinophils Relative: 1.2 %
HCT: 32.8 % — ABNORMAL LOW (ref 38.5–50.0)
Hemoglobin: 11 g/dL — ABNORMAL LOW (ref 13.2–17.1)
Lymphs Abs: 1700 cells/uL (ref 850–3900)
MCH: 29.7 pg (ref 27.0–33.0)
MCHC: 33.5 g/dL (ref 32.0–36.0)
MCV: 88.6 fL (ref 80.0–100.0)
MPV: 9.9 fL (ref 7.5–12.5)
Monocytes Relative: 6.8 %
Neutro Abs: 4529 cells/uL (ref 1500–7800)
Neutrophils Relative %: 66.6 %
Platelets: 354 10*3/uL (ref 140–400)
RBC: 3.7 10*6/uL — ABNORMAL LOW (ref 4.20–5.80)
RDW: 11.9 % (ref 11.0–15.0)
Total Lymphocyte: 25 %
WBC: 6.8 10*3/uL (ref 3.8–10.8)

## 2018-10-25 LAB — COMPLETE METABOLIC PANEL WITH GFR
AG Ratio: 0.8 (calc) — ABNORMAL LOW (ref 1.0–2.5)
ALT: 13 U/L (ref 9–46)
AST: 20 U/L (ref 10–35)
Albumin: 3.3 g/dL — ABNORMAL LOW (ref 3.6–5.1)
Alkaline phosphatase (APISO): 114 U/L (ref 35–144)
BUN/Creatinine Ratio: 11 (calc) (ref 6–22)
BUN: 26 mg/dL — ABNORMAL HIGH (ref 7–25)
CO2: 31 mmol/L (ref 20–32)
Calcium: 9.9 mg/dL (ref 8.6–10.3)
Chloride: 99 mmol/L (ref 98–110)
Creat: 2.47 mg/dL — ABNORMAL HIGH (ref 0.70–1.11)
GFR, Est African American: 26 mL/min/{1.73_m2} — ABNORMAL LOW (ref >=60)
GFR, Est Non African American: 22 mL/min/{1.73_m2} — ABNORMAL LOW (ref >=60)
Globulin: 4.4 g/dL (calc) — ABNORMAL HIGH (ref 1.9–3.7)
Glucose, Bld: 95 mg/dL (ref 65–139)
Potassium: 4.4 mmol/L (ref 3.5–5.3)
Sodium: 137 mmol/L (ref 135–146)
Total Bilirubin: 0.5 mg/dL (ref 0.2–1.2)
Total Protein: 7.7 g/dL (ref 6.1–8.1)

## 2018-10-25 LAB — VITAMIN B12: Vitamin B-12: 1001 pg/mL (ref 200–1100)

## 2018-10-28 IMAGING — DX DG CHEST 2V
2 series · 2 of 2 positions shown · non-contrast
Comparison: Chest x-ray of April 03, 2016

CLINICAL DATA: Intermittent cough; history of emphysema.

EXAM:
CHEST  2 VIEW

[chest pa]
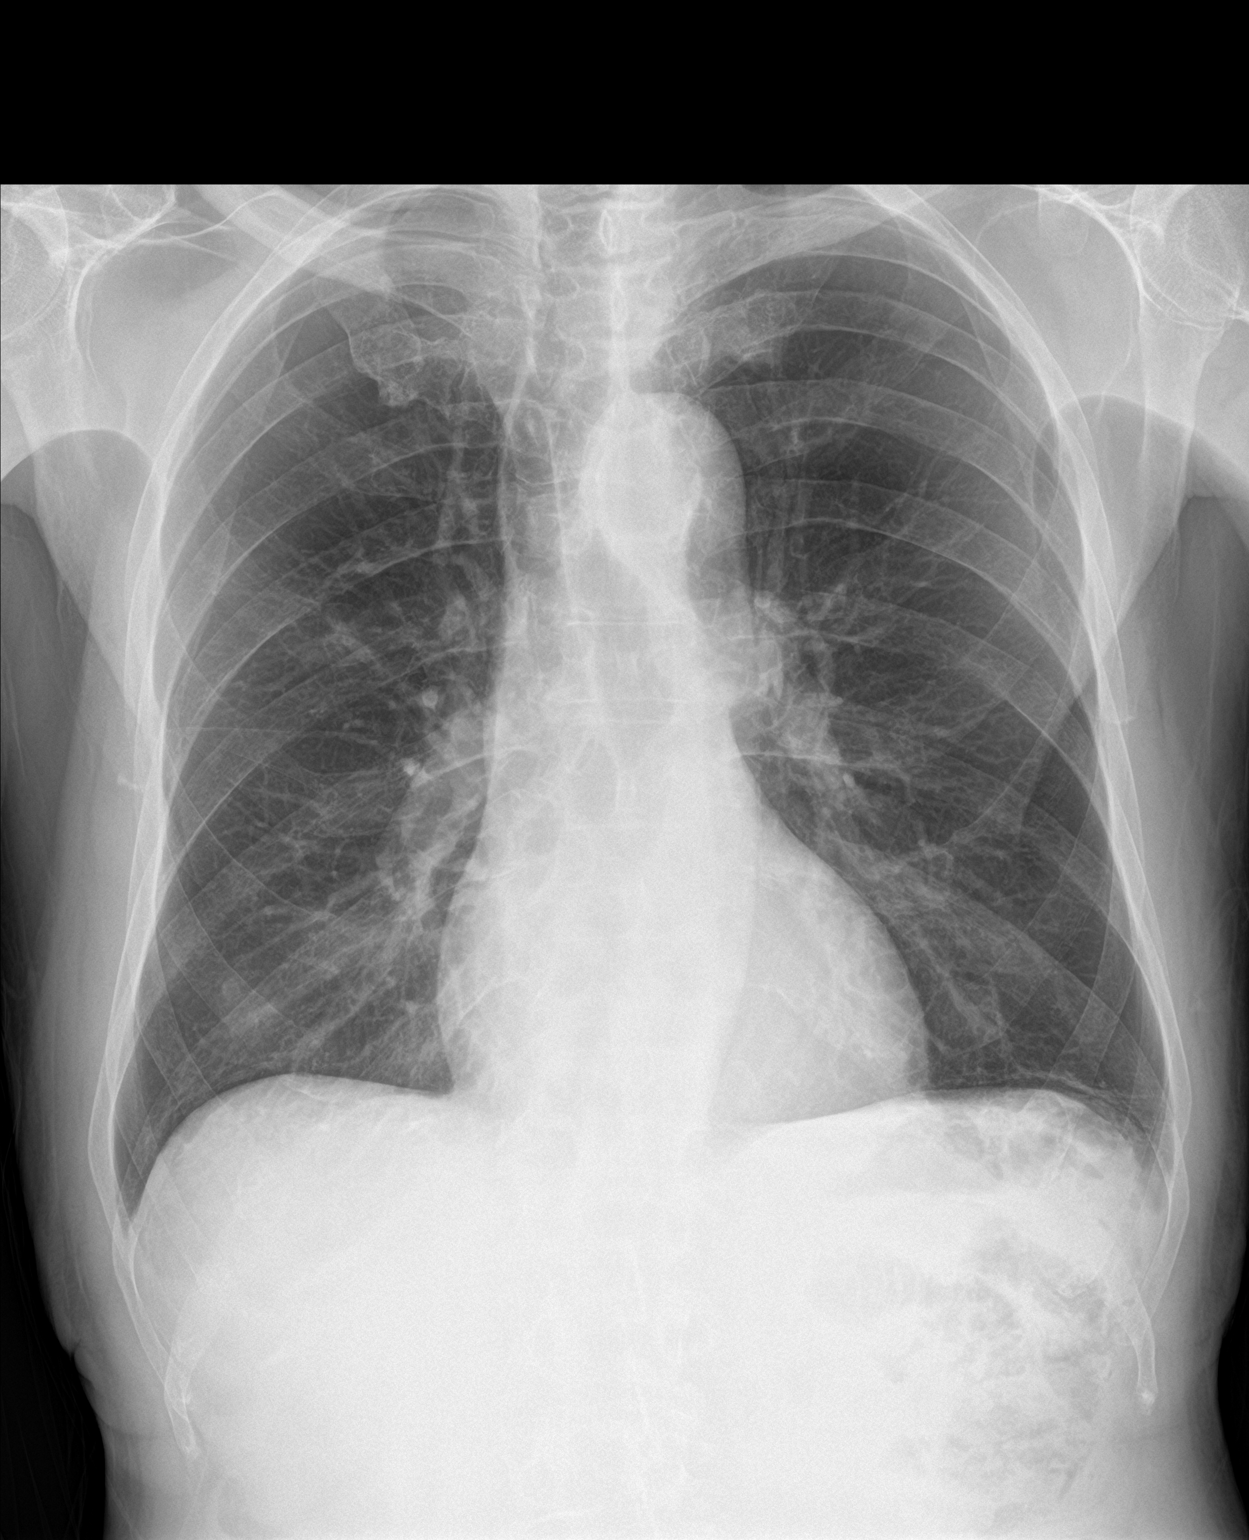

[chest lat]
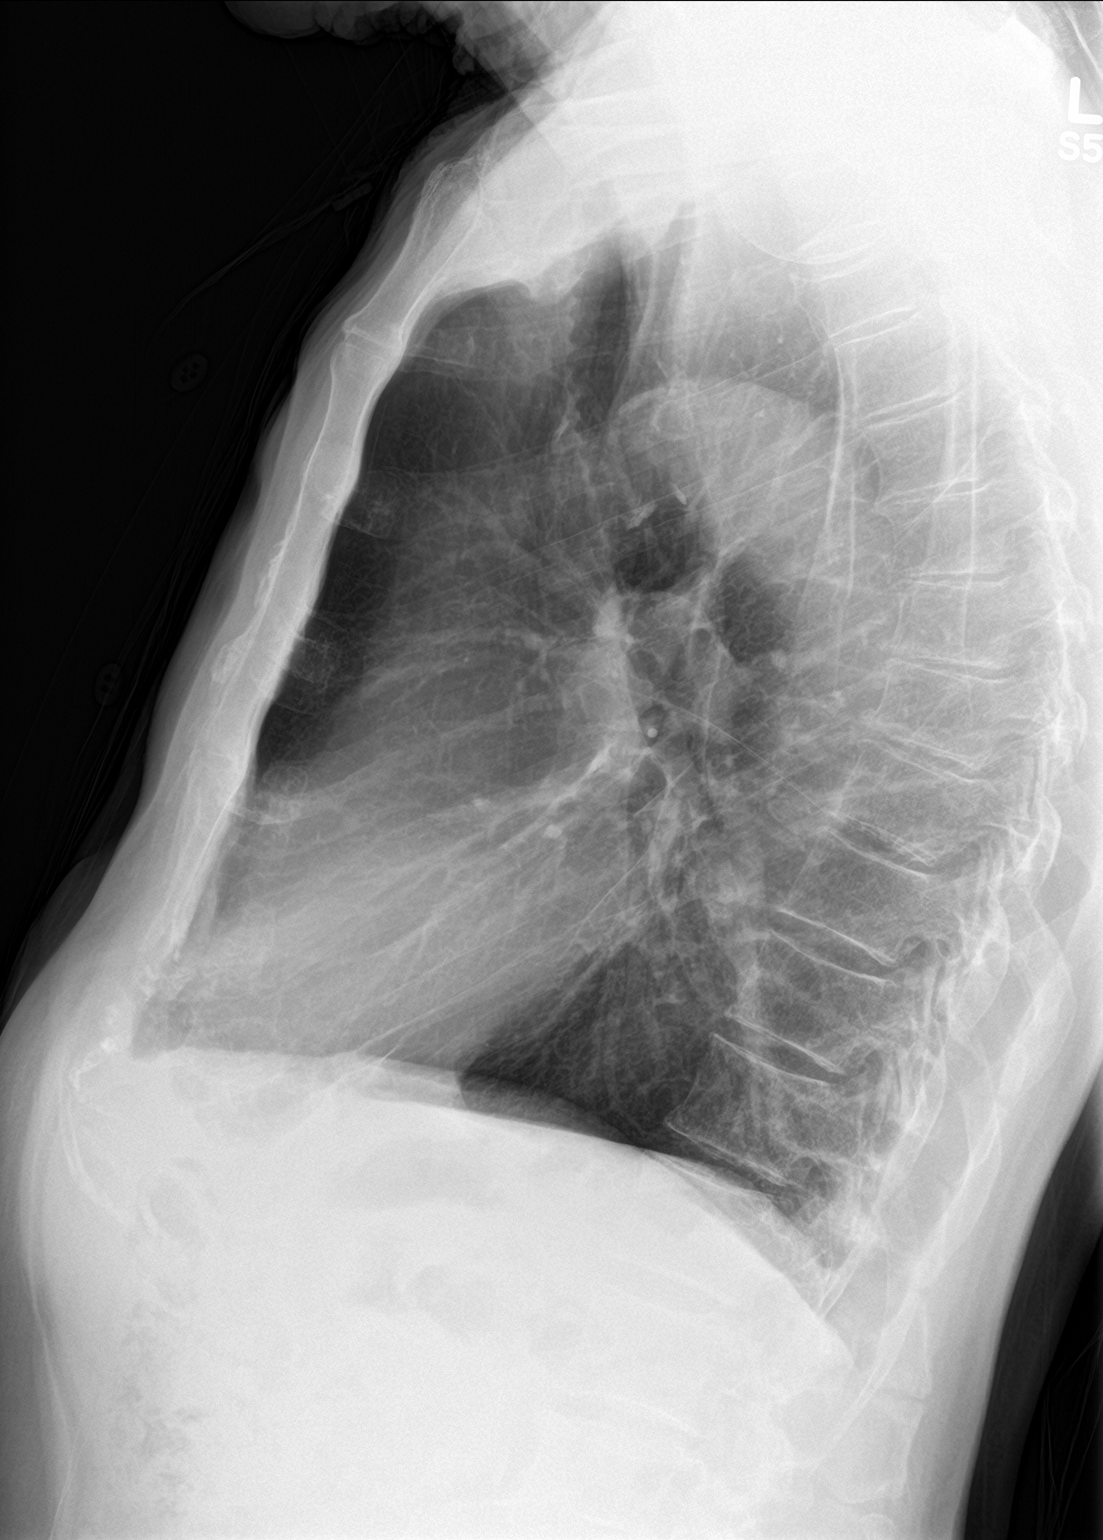

[2 of 2 positions shown; findings below may reference images not displayed]

FINDINGS: The lungs remain hyperinflated with hemidiaphragm flattening. There
is no focal infiltrate. There is no pleural effusion. The heart and
pulmonary vascularity are normal. The mediastinum is normal in
width. There is calcification in the wall of the aortic arch. The
bony thorax is unremarkable.
IMPRESSION: COPD. There is no pneumonia nor other acute cardiopulmonary
abnormality.

## 2018-10-29 ENCOUNTER — Ambulatory Visit (INDEPENDENT_AMBULATORY_CARE_PROVIDER_SITE_OTHER): Payer: Medicare Other | Admitting: Nurse Practitioner

## 2018-10-29 ENCOUNTER — Encounter: Payer: Self-pay | Admitting: Nurse Practitioner

## 2018-10-29 ENCOUNTER — Other Ambulatory Visit: Payer: Self-pay

## 2018-10-29 VITALS — BP 130/82 | HR 72 | Temp 97.6°F | Ht 67.0 in | Wt 115.0 lb

## 2018-10-29 DIAGNOSIS — F0390 Unspecified dementia without behavioral disturbance: Secondary | ICD-10-CM

## 2018-10-29 DIAGNOSIS — N183 Chronic kidney disease, stage 3 unspecified: Secondary | ICD-10-CM

## 2018-10-29 DIAGNOSIS — M009 Pyogenic arthritis, unspecified: Secondary | ICD-10-CM | POA: Diagnosis not present

## 2018-10-29 DIAGNOSIS — R531 Weakness: Secondary | ICD-10-CM

## 2018-10-29 DIAGNOSIS — D649 Anemia, unspecified: Secondary | ICD-10-CM

## 2018-10-29 DIAGNOSIS — N138 Other obstructive and reflux uropathy: Secondary | ICD-10-CM

## 2018-10-29 DIAGNOSIS — N401 Enlarged prostate with lower urinary tract symptoms: Secondary | ICD-10-CM

## 2018-10-29 DIAGNOSIS — M25562 Pain in left knee: Secondary | ICD-10-CM

## 2018-10-29 DIAGNOSIS — R634 Abnormal weight loss: Secondary | ICD-10-CM

## 2018-10-29 LAB — BASIC METABOLIC PANEL WITH GFR
BUN/Creatinine Ratio: 11 (calc) (ref 6–22)
BUN: 26 mg/dL — ABNORMAL HIGH (ref 7–25)
CO2: 28 mmol/L (ref 20–32)
Calcium: 9.6 mg/dL (ref 8.6–10.3)
Chloride: 101 mmol/L (ref 98–110)
Creat: 2.34 mg/dL — ABNORMAL HIGH (ref 0.70–1.11)
GFR, Est African American: 27 mL/min/{1.73_m2} — ABNORMAL LOW (ref >=60)
GFR, Est Non African American: 24 mL/min/{1.73_m2} — ABNORMAL LOW (ref >=60)
Glucose, Bld: 93 mg/dL (ref 65–139)
Potassium: 4.1 mmol/L (ref 3.5–5.3)
Sodium: 139 mmol/L (ref 135–146)

## 2018-10-29 MED ORDER — POLYSACCHARIDE IRON COMPLEX 150 MG PO CAPS: 150.0000 mg | ORAL_CAPSULE | Freq: Every day | ORAL | 0 refills | Status: DC

## 2018-10-29 NOTE — Progress Notes (Signed)
Careteam: [name], NP as PCP - General (Geriatric Medicine) [name], MD as Consulting Physician (Ophthalmology) [name], MD as Consulting Physician (Neurology) [name], MD as Consulting Physician (Pulmonary Disease)  Advanced Directive information Does Patient Have a Medical Advance Directive?: Yes, Type of Advance Directive: [place], Does patient want to make changes to medical advance directive?: No - Patient declined  No Known Allergies  Chief Complaint  Patient presents with  . Transitions Of Care    Released from [facility] on 10/18/2018 (x 10 days). Here with daughter Juventino Slovak  . Medical Management of Chronic Issues    3 month follow-up. Examine left arm. Patient had surgery on left knee, patient c/o knee swelling. Patient seen ortho for a follow-up last Tuesday.   . Immunizations    Discuss need for Shingrix      HPI: Patient is a 85 y.o. male seen in the office today for [facility], discharged from rehab on 5/8 after hospital stay for left septic knee.    pt with history of chronic indwelling Foley catheter for BPH (status post TURP), essential hypertension. Patient presented to [facility] ED On 10/03/2018 with left knee pain (lives with wife), a large left knee effusion, 33,000 WBC 90% neutrophils. Negative for crystals. 10/03/2018 patient underwent left knee arthrotomy/washout for pyogenic arthritis of the left knee joint, per Dr. Gerarda Fraction. ID consulted 10/04/2018 and has been following. Patient was treated with vancomycin and ceftriaxone. Synovial fluid wbc 33k, 90%PMNS, gram stain and culture negative, esr/crp elevated, AFB stain on synovial fluid negative, GC/Chlaymedia neg. bcx negative to date, synovial fluid positive for E. Coli; ID recommended cipro x 4 weeks. Patient was also noted to have Enterococcus faecalis in his urine which is felt likely to colonization due to his chronic Foley catheter. He did have  episode of hypotension which was treated bolus of fluid. He has acute encephalopathy with senile dementia and his daughter has been present with him for part of his hospitalization. His head CT was negative. Patient has been seen by PT/OT and is below normal functional level. Multidisciplinary inpatient rehabilitation has been recommended. He was admitted to rehab on 4/23. He was discharged on 10/18/2018. ID consult recommending 4 weeks of ciprofloxacin-approximate end date (10/31/2018).  Ortho has continued to follow up. Continues to have some swelling but overall ROM is good. No pain. Will follow up again with ortho in 3 weeks.  No heat and swelling continues to improve.  Patient was being followed by ID, Dr. Vanessa [place] but on review of epic it looks like they have currently signed off and Cipro has been completed.   He is not eating much, 3 days started eating better. No BM in 3 days.  When he had his last BM was regular.   Home health was following but has not been out since out of rehab. Nursing, OT, PT reached out, [facility] out of [place]. [phone]. Improving every day. Still weak and fatigued.   Weight loss ongoing, giving boost and ensure since from the hospital. Weight 115lbs today.   Dementia- ongoing, he has been evaluated by neurology. MMSE 21/30.  Memory has been stable. A little worse when he was in the hospital but better at home.   He saw the liver clinic and imaging revealed cyst but drainage not recommended. MRI revealed large right hepatic lobe hemorrhagic cyst (12.4 x 9.2 x 11.5 cm); per liver clinic, low likelihood of  malignancy;f/u MRI in May 2019 showed stable right hepatic lobe complex/hemorrhagic cyst 11cm/stable renal cysts/stable distended bladder with moderate trabeculation likely due to chronic bladder outlet obstruction and liver clinic recommends no further w/u.LFTs nml; alk phos 198- last visit in December.  NSVT - followed by cardio.Last OV  05/28/18,family does not desire aggressive care.  CKD - stage 3. Cr 1.22 on 07/17/2018 now up to 2.47 on recent labs, drinking well and urine is clear.   Urinary retention- had TURP surgery  07/16/2018. However still requiring chronic foley, unable to go without. Following with urologist. Next appt this week. Continues to have long term foley.  Debility- ongoing, needs new order with home health.    Review of Systems:  Review of Systems  Unable to perform ROS: Dementia    Past Medical History:  Diagnosis Date  . BPH (benign prostatic hypertrophy)   . Cataract    Both  . Dementia ([place])   . Diverticula of colon   . Elevated lactic acid level   . Gallbladder polyp   . Glaucoma 1986   Dr. Mila Merry  . Gram negative sepsis ([place])   . Hearing loss   . Hx of adenomatous colonic polyps   . Internal hemorrhoids   . Left varicocele   . Liver cyst   . Obesity   . Pseudodementia   . RBBB (right bundle branch block)    Past Surgical History:  Procedure Laterality Date  . CATARACT EXTRACTION Right   . GLAUCOMA REPAIR Left   . TRANSURETHRAL RESECTION OF PROSTATE  07/16/2018   [facility]   Social History:   reports that he quit smoking about 62 years ago. His smoking use included cigarettes. He has a 3.00 pack-year smoking history. He has never used smokeless tobacco. He reports that he does not drink alcohol or use drugs.  Family History  Problem Relation Age of Onset  . Heart disease Father 55  . Parkinson's disease Father   . Cancer Mother        ? type  . Colon cancer Neg Hx     Medications: Patient's Medications  New Prescriptions   No medications on file  Previous Medications   ACETAMINOPHEN (TYLENOL) 500 MG TABLET    Take 500 mg by mouth as needed.   ASPIRIN EC 81 MG TABLET    Take 81 mg by mouth daily.   BRIMONIDINE (ALPHAGAN) 0.2 % OPHTHALMIC SOLUTION    Place 1 drop into the left eye Twice daily.    CLOTRIMAZOLE (LOTRIMIN) 1 % CREAM    Apply 1 application  topically 2 (two) times daily as needed.   DORZOLAMIDE-TIMOLOL (COSOPT) 22.3-6.8 MG/ML OPHTHALMIC SOLUTION    Place 1 drop into the left eye 2 (two) times daily.   FAMOTIDINE (PEPCID) 20 MG TABLET    Take 20 mg by mouth at bedtime as needed for heartburn or indigestion.   IRON POLYSACCHARIDES (NIFEREX) 150 MG CAPSULE    Take 150 mg by mouth 2 (two) times daily. For 30 days   MISC. DEVICES (COMMODE 3-IN-1) MISC    Use 3-in-1 bedside commode daily as needed   MONTELUKAST (SINGULAIR) 5 MG CHEWABLE TABLET    Chew 5 mg by mouth as needed.   MULTIPLE VITAMIN (MULTIVITAMIN) TABLET    Take 1 tablet by mouth daily.  Modified Medications   No medications on file  Discontinued Medications   MONTELUKAST (SINGULAIR) 5 MG CHEWABLE TABLET    CHEW 1 TABLET (5 MG TOTAL) BY MOUTH  AT BEDTIME.     Physical Exam:  Vitals:   10/29/18 1002  BP: 130/82  Pulse: 72  Temp: 97.6 F (36.4 C)  TempSrc: Oral  Weight: 115 lb (52.2 kg)  Height: _0  (1.702 m)   Body mass index is 18.01 kg/m.  Physical Exam Constitutional:      Appearance: He is well-developed.     Comments: Thin frail male, NAD  Eyes:     General: No scleral icterus.    Pupils: Pupils are equal, round, and reactive to light.     Comments: OS corneal clouding  Neck:     Musculoskeletal: Neck supple.     Thyroid: No thyromegaly.     Vascular: No carotid bruit.  Cardiovascular:     Rate and Rhythm: Normal rate and regular rhythm.     Heart sounds: Murmur (3/6 SEM) present. No friction rub. No gallop.   Pulmonary:     Effort: Pulmonary effort is normal.     Breath sounds: Normal breath sounds. No wheezing or rales.  Chest:     Chest wall: No tenderness.  Abdominal:     General: Bowel sounds are normal. There is no distension or abdominal bruit.     Palpations: Abdomen is soft. There is no hepatomegaly, mass or pulsatile mass.     Tenderness: There is no abdominal tenderness. There is no guarding or rebound.  Musculoskeletal:      Left knee: He exhibits swelling. He exhibits normal range of motion. No tenderness found.  Lymphadenopathy:     Cervical: No cervical adenopathy.  Skin:    General: Skin is warm and dry.     Findings: No rash.  Neurological:     Mental Status: He is alert.     Deep Tendon Reflexes: Reflexes are normal and symmetric.     Labs reviewed: Basic Metabolic Panel: Recent Labs    04/16/18 1408 05/03/18 1127 05/28/18 1631 10/24/18 1010  NA 136 139  --  137  K 3.7 3.5  --  4.4  CL 99 101  --  99  CO2 30 31  --  31  GLUCOSE 114 91  --  95  BUN 18 8  --  26*  CREATININE 2.01* 1.18*  --  2.47*  CALCIUM 8.8 9.0  --  9.9  TSH  --   --  4.520*  --    Liver Function Tests: Recent Labs    01/15/18 1218 04/02/18 04/16/18 1408 10/24/18 1010  AST _1 ALT _2 ALKPHOS  --  179*  --   --   BILITOT 0.6  --  0.6 0.5  PROT 6.8  --  6.4 7.7   No results for input(s): LIPASE, AMYLASE in the last 8760 hours. No results for input(s): AMMONIA in the last 8760 hours. CBC: Recent Labs    04/05/18 04/16/18 1408 10/24/18 1010  WBC 9.9 5.6 6.8  NEUTROABS  --  4,217 4,529  HGB 10.5* 11.4* 11.0*  HCT 31* 33.2* 32.8*  MCV  --  87.4 88.6  PLT 88* 430* 354   Lipid Panel: No results for input(s): CHOL, HDL, LDLCALC, TRIG, CHOLHDL, LDLDIRECT in the last 8760 hours. TSH: Recent Labs    05/28/18 1631  TSH 4.520*   A1C: No results found for: HGBA1C   Assessment/Plan 1. Dementia without behavioral disturbance, unspecified dementia type ([place]) -progressive decline. Worsening memory loss since hospitalization but has seemed to improve some now that he  is home. Lives with wife with lots of support from family.   2. Benign prostatic hyperplasia with lower urinary tract symptoms, symptom details unspecified -continues with chronic foley. Continues to follow up with urology.  - Ambulatory referral to Home Health  3. Pyogenic arthritis of left knee joint, due to unspecified  organism [facility]) S/p left knee arthrotomy/washout. Completed 4 weeks of cipro. ID has signed off and ongoing follow up with ortho. Pain and mobility have improved.  - Ambulatory referral to [facility]  4. Acute pain of left knee Improved after treatment for septic arthritis. Continues with ortho follow up and home health PT.  - Ambulatory referral to [facility]  5. CKD (chronic kidney disease) stage 3, GFR 30-59 ml/min (HCC) -worsening renal function on last labs, will follow up today. - BASIC METABOLIC PANEL WITH GFR  6. Weight loss Decrease appetite and intake during hospitalization, now has increased PO intake, family offering supplements as well.   7. Anemia, unspecified type -hgb stable on last lab, will continue iron supplement for now.  - iron polysaccharides (NIFEREX) 150 MG capsule; Take 1 capsule (150 mg total) by mouth daily. For 30 days  Dispense: 90 capsule; Refill: 0  8. Generalized weakness - has made significant improvement in rehab, now at home and needing home health. Order placed at this time.  - Ambulatory referral to [facility]  Next appt: 01/29/2019 [name]. [place], [facility] [phone]

## 2018-10-30 ENCOUNTER — Other Ambulatory Visit: Payer: Self-pay | Admitting: Nurse Practitioner

## 2018-10-30 DIAGNOSIS — N183 Chronic kidney disease, stage 3 unspecified: Secondary | ICD-10-CM

## 2018-10-31 ENCOUNTER — Telehealth: Payer: Self-pay | Admitting: *Deleted

## 2018-10-31 MED ORDER — DOCUSATE SODIUM 100 MG PO CAPS: 100.0000 mg | ORAL_CAPSULE | Freq: Every day | ORAL | 0 refills | Status: DC

## 2018-10-31 MED ORDER — FERROUS SULFATE 325 (65 FE) MG PO TABS: 325.0000 mg | ORAL_TABLET | Freq: Every day | ORAL | 0 refills | Status: DC

## 2018-10-31 NOTE — Telephone Encounter (Signed)
Wife called and stated that [name] called in patient's iron medication yesterday, Niferex and his insurance company will not cover. [facility] told her that it will costs him $1200 out of pocket. Wife cannot afford this. Wife stated that patient DOES have 2 weeks of the medication left from where he was getting it from the facility and will continue to take those until they run out. Please Advise.

## 2018-10-31 NOTE — Telephone Encounter (Signed)
Start on ferrous sulfate 325 mg tablet one by mouth daily along with stool softener colace 100 mg capsule one by mouth daily to prevent constipation from iron. Also encourage to eat dark leafy vegetables and iron rich foods such as beets,black beans etc.

## 2018-10-31 NOTE — Telephone Encounter (Signed)
Patient wife notified and agreed.  Medication list updated and Rx sent to mail order

## 2018-11-06 ENCOUNTER — Telehealth: Payer: Self-pay | Admitting: *Deleted

## 2018-11-06 NOTE — Telephone Encounter (Signed)
Is he having any abdominal pain?  Can use a fleets enema to help stimulate. We can see him in office or do a telephone visit if need.

## 2018-11-06 NOTE — Telephone Encounter (Signed)
Patient wife called and stated that patient has not had a Bowel Movement in the last 2 weeks.  Stated that the little bit that he did go was jet black and oozing out. Stated that he states he has the urge but can't go.  Has not taken the iron or stool softner due to not receiving them from [facility] yet.   Gave Miralax this morning with no relief. Stomach not distended. Please Advise.

## 2018-11-06 NOTE — Telephone Encounter (Signed)
No abdominal pain. Appointment scheduled for tomorrow morning.

## 2018-11-07 ENCOUNTER — Ambulatory Visit (INDEPENDENT_AMBULATORY_CARE_PROVIDER_SITE_OTHER): Payer: Medicare Other | Admitting: Nurse Practitioner

## 2018-11-07 ENCOUNTER — Encounter: Payer: Self-pay | Admitting: Nurse Practitioner

## 2018-11-07 ENCOUNTER — Other Ambulatory Visit: Payer: Self-pay

## 2018-11-07 VITALS — BP 118/70 | HR 64 | Temp 97.6°F | Ht 67.0 in | Wt 116.0 lb

## 2018-11-07 DIAGNOSIS — N183 Chronic kidney disease, stage 3 unspecified: Secondary | ICD-10-CM

## 2018-11-07 DIAGNOSIS — R5381 Other malaise: Secondary | ICD-10-CM

## 2018-11-07 DIAGNOSIS — D649 Anemia, unspecified: Secondary | ICD-10-CM | POA: Diagnosis not present

## 2018-11-07 DIAGNOSIS — K5903 Drug induced constipation: Secondary | ICD-10-CM | POA: Diagnosis not present

## 2018-11-07 MED ORDER — FERROUS SULFATE 325 (65 FE) MG PO TABS: 325.0000 mg | ORAL_TABLET | Freq: Every day | ORAL | 0 refills | Status: DC

## 2018-11-07 MED ORDER — DOCUSATE SODIUM 100 MG PO CAPS: 100.0000 mg | ORAL_CAPSULE | Freq: Every day | ORAL | 0 refills | Status: DC

## 2018-11-07 NOTE — Patient Instructions (Signed)
Continue mirlax 17 gm daily Continue to increase fluid To use fleets enema daily until he starts to have good bowel movements.   Seek medical attention for fever, vomiting, nausea, abdominal pain.

## 2018-11-07 NOTE — Progress Notes (Signed)
Careteam: Patient Care Team: Dave Chandler, NP as PCP - General (Geriatric Medicine) Dave Natal, MD as Consulting Physician (Ophthalmology) Dave Sprang, MD as Consulting Physician (Neurology) Dave Rockers, MD as Consulting Physician (Pulmonary Disease)  Advanced Directive information    No Known Allergies  Chief Complaint  Patient presents with  . Acute Visit    Constipation x 2 weeks and dark stool.   . Medication Management    Need RX for ferrous sulfate and colace at local pharmacy([facility]), patient has not received mailorder suppy     HPI: Patient is a 85 y.o. male seen in the office today due to constipation. No BM in 2 weeks.  Pt has recently been started on iron but was not taking a stool softener.  Wife recently started miralax 5 days ago. Did fleets enema 3 days ago with no effect. Did not repeat enema.  No abdominal pain. Passing gas. Denies nausea or vomiting.  Continues to eat well. Eating better and drinking more fluids.  Weakness has improved. Walking better. Still has not heard from home health.   Review of Systems:  Review of Systems  Unable to perform ROS: Dementia    Past Medical History:  Diagnosis Date  . BPH (benign prostatic hypertrophy)   . Cataract    Both  . Dementia ([place])   . Diverticula of colon   . Elevated lactic acid level   . Gallbladder polyp   . Glaucoma 1986   Dr. Mila Gutierrez  . Gram negative sepsis ([place])   . Hearing loss   . Hx of adenomatous colonic polyps   . Internal hemorrhoids   . Left varicocele   . Liver cyst   . Obesity   . Pseudodementia   . RBBB (right bundle branch block)    Past Surgical History:  Procedure Laterality Date  . CATARACT EXTRACTION Right   . GLAUCOMA REPAIR Left   . TRANSURETHRAL RESECTION OF PROSTATE  07/16/2018   [facility]   Social History:   reports that he quit smoking about 62 years ago. His smoking use included cigarettes. He has a 3.00 pack-year smoking  history. He has never used smokeless tobacco. He reports that he does not drink alcohol or use drugs.  Family History  Problem Relation Age of Onset  . Heart disease Father 34  . Parkinson's disease Father   . Cancer Mother        ? type  . Colon cancer Neg Hx     Medications: Patient's Medications  New Prescriptions   No medications on file  Previous Medications   ACETAMINOPHEN (TYLENOL) 500 MG TABLET    Take 500 mg by mouth as needed.   ASPIRIN EC 81 MG TABLET    Take 81 mg by mouth daily.   BRIMONIDINE (ALPHAGAN) 0.2 % OPHTHALMIC SOLUTION    Place 1 drop into the left eye Twice daily.    CLOTRIMAZOLE (LOTRIMIN) 1 % CREAM    Apply 1 application topically 2 (two) times daily as needed.   DOCUSATE SODIUM (COLACE) 100 MG CAPSULE    Take 1 capsule (100 mg total) by mouth daily.   DORZOLAMIDE-TIMOLOL (COSOPT) 22.3-6.8 MG/ML OPHTHALMIC SOLUTION    Place 1 drop into the left eye 2 (two) times daily.   FAMOTIDINE (PEPCID) 20 MG TABLET    Take 20 mg by mouth at bedtime as needed for heartburn or indigestion.   FERROUS SULFATE 325 (65 FE) MG TABLET  Take 1 tablet (325 mg total) by mouth daily with breakfast.   MISC. DEVICES (COMMODE 3-IN-1) MISC    Use 3-in-1 bedside commode daily as needed   MONTELUKAST (SINGULAIR) 5 MG CHEWABLE TABLET    Chew 5 mg by mouth as needed.   MULTIPLE VITAMIN (MULTIVITAMIN) TABLET    Take 1 tablet by mouth daily.  Modified Medications   No medications on file  Discontinued Medications   No medications on file     Physical Exam:  Vitals:   11/07/18 1035  BP: 118/70  Pulse: 64  Temp: 97.6 F (36.4 C)  TempSrc: Oral  Weight: 116 lb (52.6 kg)  Height: 5\' 7"  (1.702 m)   Body mass index is 18.17 kg/m.  Physical Exam Exam conducted with a chaperone present.  Constitutional:      Appearance: He is well-developed.     Comments: Thin frail male, NAD  Eyes:     General: No scleral icterus.    Pupils: Pupils are equal, round, and reactive to light.      Comments: OS corneal clouding  Neck:     Musculoskeletal: Neck supple.     Thyroid: No thyromegaly.     Vascular: No carotid bruit.  Cardiovascular:     Rate and Rhythm: Normal rate and regular rhythm.     Heart sounds: Murmur (3/6 SEM) present. No friction rub. No gallop.   Pulmonary:     Effort: Pulmonary effort is normal.     Breath sounds: Normal breath sounds. No wheezing or rales.  Chest:     Chest wall: No tenderness.  Abdominal:     General: Bowel sounds are normal. There is no distension or abdominal bruit.     Palpations: Abdomen is soft. There is no hepatomegaly, mass or pulsatile mass.     Tenderness: There is no abdominal tenderness. There is no guarding or rebound.  Genitourinary:    Rectum: Normal. Guaiac result negative.     Comments: Large amount of soft stool in rectal vault.  Musculoskeletal:     Left knee: He exhibits swelling. He exhibits normal range of motion. No tenderness found.  Lymphadenopathy:     Cervical: No cervical adenopathy.  Skin:    General: Skin is warm and dry.     Findings: No rash.  Neurological:     Mental Status: He is alert.     Labs reviewed: Basic Metabolic Panel: Recent Labs    05/03/18 1127 05/28/18 1631 10/24/18 1010 10/29/18 1047  NA 139  --  137 139  K 3.5  --  4.4 4.1  CL 101  --  99 101  CO2 31  --  31 28  GLUCOSE 91  --  95 93  BUN 8  --  26* 26*  CREATININE 1.18*  --  2.47* 2.34*  CALCIUM 9.0  --  9.9 9.6  TSH  --  4.520*  --   --    Liver Function Tests: Recent Labs    01/15/18 1218 04/02/18 04/16/18 1408 10/24/18 1010  AST 22 21 22 20   ALT 9 11 11 13   ALKPHOS  --  179*  --   --   BILITOT 0.6  --  0.6 0.5  PROT 6.8  --  6.4 7.7   No results for input(s): LIPASE, AMYLASE in the last 8760 hours. No results for input(s): AMMONIA in the last 8760 hours. CBC: Recent Labs    04/05/18 04/16/18 1408 10/24/18 1010  WBC 9.9 5.6 6.8  NEUTROABS  --  4,217 4,529  HGB 10.5* 11.4* 11.0*  HCT 31* 33.2*  32.8*  MCV  --  87.4 88.6  PLT 88* 430* 354   Lipid Panel: No results for input(s): CHOL, HDL, LDLCALC, TRIG, CHOLHDL, LDLDIRECT in the last 8760 hours. TSH: Recent Labs    05/28/18 1631  TSH 4.520*   A1C: No results found for: HGBA1C   Assessment/Plan 1. Anemia, unspecified type -currently on iron supplement causing constipation. - ferrous sulfate 325 (65 FE) MG tablet; Take 1 tablet (325 mg total) by mouth daily with breakfast.  Dispense: 30 tablet; Refill: 0  2. Drug-induced constipation -due to iron supplement and poor po intake. Has increased fluids and more active.  -to use fleets enema daily at this time until BM occurs  -continues mirlax 17 gm daily at this time - docusate sodium (COLACE) 100 MG capsule; Take 1 capsule (100 mg total) by mouth daily.  Dispense: 30 capsule; Refill: 0 sent to  Pharmacy however to hold while giving enemas but will start once bowels start moving to avoid worsening constipation.   3. Debility -will have referral coordinator look into home health referral.   4. CKD -acute on chronic kidney disease noted, has increased fluid, will follow up lab today if continues to be elevated to stage 4 will need nephrology referral.    Next appt: 3 months, sooner if needed Tonjua Rossetti K. [place], [facility] [phone]

## 2018-11-08 LAB — BASIC METABOLIC PANEL WITH GFR
BUN/Creatinine Ratio: 10 (calc) (ref 6–22)
BUN: 15 mg/dL (ref 7–25)
CO2: 27 mmol/L (ref 20–32)
Calcium: 9.6 mg/dL (ref 8.6–10.3)
Chloride: 103 mmol/L (ref 98–110)
Creat: 1.53 mg/dL — ABNORMAL HIGH (ref 0.70–1.11)
GFR, Est African American: 46 mL/min/{1.73_m2} — ABNORMAL LOW (ref >=60)
GFR, Est Non African American: 39 mL/min/{1.73_m2} — ABNORMAL LOW (ref >=60)
Glucose, Bld: 91 mg/dL (ref 65–99)
Potassium: 4.1 mmol/L (ref 3.5–5.3)
Sodium: 139 mmol/L (ref 135–146)

## 2018-11-13 ENCOUNTER — Other Ambulatory Visit: Payer: Self-pay

## 2018-11-22 ENCOUNTER — Telehealth: Payer: Self-pay | Admitting: Cardiology

## 2018-11-22 NOTE — Telephone Encounter (Signed)
Virtual Visit Pre-Appointment Phone Call  "(Name), I am calling you today to discuss your upcoming appointment. We are currently trying to limit exposure to the virus that causes COVID-19 by seeing patients at home rather than in the office."  1. "What is the BEST phone number to call the day of the visit?" - include this in appointment notes  2. Do you have or have access to (through a family member/friend) a smartphone with video capability that we can use for your visit?" a. If yes - list this number in appt notes as cell (if different from BEST phone #) and list the appointment type as a VIDEO visit in appointment notes b. If no - list the appointment type as a PHONE visit in appointment notes  3. Confirm consent - "In the setting of the current Covid19 crisis, you are scheduled for a (phone or video) visit with your provider on (date) at (time).  Just as we do with many in-office visits, in order for you to participate in this visit, we must obtain consent.  If you'd like, I can send this to your mychart (if signed up) or email for you to review.  Otherwise, I can obtain your verbal consent now.  All virtual visits are billed to your insurance company just like a normal visit would be.  By agreeing to a virtual visit, we'd like you to understand that the technology does not allow for your provider to perform an examination, and thus may limit your provider's ability to fully assess your condition. If your provider identifies any concerns that need to be evaluated in person, we will make arrangements to do so.  Finally, though the technology is pretty good, we cannot assure that it will always work on either your or our end, and in the setting of a video visit, we may have to convert it to a phone-only visit.  In either situation, we cannot ensure that we have a secure connection.  Are you willing to proceed?" STAFF: Did the patient verbally acknowledge consent to telehealth visit? Document  YES/NO here: Yes per spouse  4. Advise patient to be prepared - "Two hours prior to your appointment, go ahead and check your blood pressure, pulse, oxygen saturation, and your weight (if you have the equipment to check those) and write them all down. When your visit starts, your provider will ask you for this information. If you have an Apple Watch or Kardia device, please plan to have heart rate information ready on the day of your appointment. Please have a pen and paper handy nearby the day of the visit as well."  5. Give patient instructions for MyChart download to smartphone OR Doximity/Doxy.me as below if video visit (depending on what platform provider is using)  6. Inform patient they will receive a phone call 15 minutes prior to their appointment time (may be from unknown caller ID) so they should be prepared to answer    TELEPHONE CALL NOTE  Luchiano E Portela has been deemed a candidate for a follow-up tele-health visit to limit community exposure during the Covid-19 pandemic. I spoke with the patient via phone to ensure availability of phone/video source, confirm preferred email & phone number, and discuss instructions and expectations.  I reminded [name] to be prepared with any vital sign and/or heart rhythm information that could potentially be obtained via home monitoring, at the time of his visit. I reminded [name] to expect a phone call  prior to his visit.  Calla Kicks 11/22/2018 9:12 AM   INSTRUCTIONS FOR DOWNLOADING THE MYCHART APP TO SMARTPHONE  - The patient must first make sure to have activated MyChart and know their login information - If Apple, go to [employer] and type in MyChart in the search bar and download the app. If Android, ask patient to go to [employer] and type in [place] in the search bar and download the app. The app is free but as with any other app downloads, their phone may require them to verify saved payment information or  Apple/Android password.  - The patient will need to then log into the app with their MyChart username and password, and select [facility] as their healthcare provider to link the account. When it is time for your visit, go to the MyChart app, find appointments, and click Begin Video Visit. Be sure to Select Allow for your device to access the Microphone and Camera for your visit. You will then be connected, and your provider will be with you shortly.  **If they have any issues connecting, or need assistance please contact MyChart service desk [phone])**  **If using a computer, in order to ensure the best quality for their visit they will need to use either of the following Internet Browsers: [employer], or Google Chrome**  IF USING DOXIMITY or DOXY.ME - The patient will receive a link just prior to their visit by text.     FULL LENGTH CONSENT FOR TELE-HEALTH VISIT   I hereby voluntarily request, consent and authorize [facility] and its employed or contracted physicians, physician assistants, nurse practitioners or other licensed health care professionals (the Practitioner), to provide me with telemedicine health care services (the Services") as deemed necessary by the treating Practitioner. I acknowledge and consent to receive the Services by the Practitioner via telemedicine. I understand that the telemedicine visit will involve communicating with the Practitioner through live audiovisual communication technology and the disclosure of certain medical information by electronic transmission. I acknowledge that I have been given the opportunity to request an in-person assessment or other available alternative prior to the telemedicine visit and am voluntarily participating in the telemedicine visit.  I understand that I have the right to withhold or withdraw my consent to the use of telemedicine in the course of my care at any time, without affecting my right to future care  or treatment, and that the Practitioner or I may terminate the telemedicine visit at any time. I understand that I have the right to inspect all information obtained and/or recorded in the course of the telemedicine visit and may receive copies of available information for a reasonable fee.  I understand that some of the potential risks of receiving the Services via telemedicine include:   Delay or interruption in medical evaluation due to technological equipment failure or disruption;  Information transmitted may not be sufficient (e.g. poor resolution of images) to allow for appropriate medical decision making by the Practitioner; and/or   In rare instances, security protocols could fail, causing a breach of personal health information.  Furthermore, I acknowledge that it is my responsibility to provide information about my medical history, conditions and care that is complete and accurate to the best of my ability. I acknowledge that Practitioner's advice, recommendations, and/or decision may be based on factors not within their control, such as incomplete or inaccurate data provided by me or distortions of diagnostic images or specimens that may result from electronic  transmissions. I understand that the practice of medicine is not an exact science and that Practitioner makes no warranties or guarantees regarding treatment outcomes. I acknowledge that I will receive a copy of this consent concurrently upon execution via email to the email address I last provided but may also request a printed copy by calling the office of [facility].    I understand that my insurance will be billed for this visit.   I have read or had this consent read to me.  I understand the contents of this consent, which adequately explains the benefits and risks of the Services being provided via telemedicine.   I have been provided ample opportunity to ask questions regarding this consent and the Services and have had  my questions answered to my satisfaction.  I give my informed consent for the services to be provided through the use of telemedicine in my medical care  By participating in this telemedicine visit I agree to the above.

## 2018-11-26 ENCOUNTER — Encounter

## 2018-11-26 ENCOUNTER — Ambulatory Visit: Payer: Medicare Other | Admitting: Neurology

## 2018-11-27 ENCOUNTER — Telehealth (INDEPENDENT_AMBULATORY_CARE_PROVIDER_SITE_OTHER): Payer: Medicare Other | Admitting: Cardiology

## 2018-11-27 ENCOUNTER — Other Ambulatory Visit: Payer: Self-pay

## 2018-11-27 ENCOUNTER — Encounter: Payer: Self-pay | Admitting: Cardiology

## 2018-11-27 VITALS — BP 120/74 | HR 98 | Temp 96.8°F

## 2018-11-27 DIAGNOSIS — E781 Pure hyperglyceridemia: Secondary | ICD-10-CM

## 2018-11-27 DIAGNOSIS — I4510 Unspecified right bundle-branch block: Secondary | ICD-10-CM | POA: Diagnosis not present

## 2018-11-27 DIAGNOSIS — I472 Ventricular tachycardia: Secondary | ICD-10-CM

## 2018-11-27 DIAGNOSIS — I10 Essential (primary) hypertension: Secondary | ICD-10-CM | POA: Diagnosis not present

## 2018-11-27 DIAGNOSIS — I4729 Other ventricular tachycardia: Secondary | ICD-10-CM

## 2018-11-27 NOTE — Progress Notes (Signed)
Virtual Visit via Telephone Note   This visit type was conducted due to national recommendations for restrictions regarding the COVID-19 Pandemic (e.g. social distancing) in an effort to limit this patient's exposure and mitigate transmission in our community.  Due to his co-morbid illnesses, this patient is at least at moderate risk for complications without adequate follow up.  This format is felt to be most appropriate for this patient at this time.  The patient did not have access to video technology/had technical difficulties with video requiring transitioning to audio format only (telephone).  All issues noted in this document were discussed and addressed.  No physical exam could be performed with this format.  Please refer to the patient's chart for his  consent to telehealth for [facility].  Evaluation Performed:  Follow-up visit  This visit type was conducted due to national recommendations for restrictions regarding the COVID-19 Pandemic (e.g. social distancing).  This format is felt to be most appropriate for this patient at this time.  All issues noted in this document were discussed and addressed.  No physical exam was performed (except for noted visual exam findings with Video Visits).  Please refer to the patient's chart (MyChart message for video visits and phone note for telephone visits) for the patient's consent to telehealth for [facility].  Date:  11/27/2018  ID: [name], DOB Aug 15, 1925, MRN [id number]   Patient Location: [address] [place] [address]   Provider location:   [place] [facility]  PCP:  [name], NP  Cardiologist:  Jenne Campus, MD     Chief Complaint: Doing okay  History of Present Illness:    [name] is a 85 y.o. male  who presents via audio/video conferencing for a telehealth visit today.  Quite complex past medical history which include bifascicular block, likely so far investigation was negative.   Recently he had multiple admissions to hospital for different reasons but not cardiac.  I am talked to his wife over the phone and she is telling me that he lost appetite he lost significant amount of weight about 20 pounds became very weak however after recent admission to hospital because of GI problem he is getting better he actually gained some weight his appetite is better he is improving   The patient does not have symptoms concerning for COVID-19 infection (fever, chills, cough, or new SHORTNESS OF BREATH).    Prior CV studies:   The following studies were reviewed today:       Past Medical History:  Diagnosis Date  . BPH (benign prostatic hypertrophy)   . Cataract    Both  . Dementia ([place])   . Diverticula of colon   . Elevated lactic acid level   . Gallbladder polyp   . Glaucoma 1986   Dr. Mila Merry  . Gram negative sepsis ([place])   . Hearing loss   . Hx of adenomatous colonic polyps   . Internal hemorrhoids   . Left varicocele   . Liver cyst   . Obesity   . Pseudodementia   . RBBB (right bundle branch block)     Past Surgical History:  Procedure Laterality Date  . CATARACT EXTRACTION Right   . GLAUCOMA REPAIR Left   . TRANSURETHRAL RESECTION OF PROSTATE  07/16/2018   [facility]     Current Meds  Medication Sig  . acetaminophen (TYLENOL) 500 MG tablet Take 500 mg by mouth as needed.  Marland Kitchen aspirin  EC 81 MG tablet Take 81 mg by mouth daily.  . brimonidine (ALPHAGAN) 0.2 % ophthalmic solution Place 1 drop into the left eye Twice daily.   . clotrimazole (LOTRIMIN) 1 % cream Apply 1 application topically 2 (two) times daily as needed.  . docusate sodium (COLACE) 100 MG capsule Take 1 capsule (100 mg total) by mouth daily.  . dorzolamide-timolol (COSOPT) 22.3-6.8 MG/ML ophthalmic solution Place 1 drop into the left eye 2 (two) times daily.  . famotidine (PEPCID) 20 MG tablet Take 20 mg by mouth at bedtime as needed for heartburn or indigestion.  . ferrous  sulfate 325 (65 FE) MG tablet Take 1 tablet (325 mg total) by mouth daily with breakfast.  . Misc. Devices (COMMODE 3-IN-1) MISC Use 3-in-1 bedside commode daily as needed  . montelukast (SINGULAIR) 5 MG chewable tablet Chew 5 mg by mouth as needed.  . Multiple Vitamin (MULTIVITAMIN) tablet Take 1 tablet by mouth daily.      Family History: The patient's family history includes Cancer in his mother; Heart disease (age of onset: 75) in his father; Parkinson's disease in his father. There is no history of Colon cancer.   ROS:   Please see the history of present illness.     All other systems reviewed and are negative.   Labs/Other Tests and Data Reviewed:     Recent Labs: 05/28/2018: TSH 4.520 10/24/2018: ALT 13; Hemoglobin 11.0; Platelets 354 11/07/2018: BUN 15; Creat 1.53; Potassium 4.1; Sodium 139  Recent Lipid Panel    Component Value Date/Time   CHOL 186 03/16/2017 0924   TRIG 49 03/16/2017 0924   TRIG 61 05/11/2006 1035   HDL 76 03/16/2017 0924   CHOLHDL 2.4 03/16/2017 0924   VLDL 12.6 09/15/2015 1359   LDLCALC 96 03/16/2017 0924   LDLDIRECT 106.9 12/26/2011 0946      Exam:    Vital Signs:  BP 120/74   Pulse 98   Temp (!) 96.8 F (36 C)     Wt Readings from Last 3 Encounters:  11/07/18 116 lb (52.6 kg)  10/29/18 115 lb (52.2 kg)  08/01/18 126 lb 6.4 oz (57.3 kg)     Well nourished, well developed in no acute distress. Alert awake and x3 I talked to his wife mostly over the phone they had no technical ability to establish video link but with talk of the phone.  I also talked to him he is very cheerful he talks about what he likes to eat.  He likes fish  Diagnosis for this visit:   1. BUNDLE BRANCH BLOCK, RIGHT   2. Essential hypertension   3. Nonsustained ventricular tachycardia ([place])   4. Pure hypertriglyceridemia      ASSESSMENT & PLAN:    1.  Bundle branch block but no syncope last monitor did not show any critical arrhythmia.  Within the next 3  months we will repeat another monitor. 2.  Essential hypertension blood pressure controlled continue present management. 3.  Nonsustained ventricular tachycardia because of his advanced age and comorbidities we decided to continue conservative approach.  No syncope. 4.  Dyslipidemia with his poor nutritional status will be just watching it  COVID-19 Education: The signs and symptoms of COVID-19 were discussed with the patient and how to seek care for testing (follow up with PCP or arrange E-visit).  The importance of social distancing was discussed today.  Patient Risk:   After full review of this patients clinical status, I feel that they are at least  moderate risk at this time.  Time:   Today, I have spent 15 minutes with the patient with telehealth technology discussing pt health issues.  I spent 5 minutes reviewing her chart before the visit.  Visit was finished at 4:50 PM.    Medication Adjustments/Labs and Tests Ordered: Current medicines are reviewed at length with the patient today.  Concerns regarding medicines are outlined above.  No orders of the defined types were placed in this encounter.  Medication changes: No orders of the defined types were placed in this encounter.    Disposition: Follow-up 3 months in the office  Signed, Park Liter, MD, [facility] 11/27/2018 4:50 PM    [place]

## 2018-11-27 NOTE — Patient Instructions (Signed)
Medication Instructions:  Your physician recommends that you continue on your current medications as directed. Please refer to the Current Medication list given to you today.  If you need a refill on your cardiac medications before your next appointment, please call your pharmacy.   Lab work: None ordered  If you have labs (blood work) drawn today and your tests are completely normal, you will receive your results only by: Marland Kitchen MyChart Message (if you have MyChart) OR . A paper copy in the mail If you have any lab test that is abnormal or we need to change your treatment, we will call you to review the results.  Testing/Procedures: None ordered  Follow-Up: At [facility], you and your health needs are our priority.  As part of our continuing mission to provide you with exceptional heart care, we have created designated Provider Care Teams.  These Care Teams include your primary Cardiologist (physician) and Advanced Practice Providers (APPs -  Physician Assistants and Nurse Practitioners) who all work together to provide you with the care you need, when you need it. You will need a follow up appointment in 3 months. You may see  Jenne Campus or another member of our [employer] Provider Team in [place]: Shirlee More, MD . Jyl Heinz, MD

## 2018-11-28 DIAGNOSIS — Z7409 Other reduced mobility: Secondary | ICD-10-CM

## 2018-11-28 HISTORY — DX: Other reduced mobility: Z74.09

## 2018-11-29 ENCOUNTER — Other Ambulatory Visit: Payer: Self-pay

## 2018-11-29 ENCOUNTER — Telehealth (INDEPENDENT_AMBULATORY_CARE_PROVIDER_SITE_OTHER): Payer: Medicare Other | Admitting: Neurology

## 2018-11-29 DIAGNOSIS — F0390 Unspecified dementia without behavioral disturbance: Secondary | ICD-10-CM

## 2018-11-29 DIAGNOSIS — F03B0 Unspecified dementia, moderate, without behavioral disturbance, psychotic disturbance, mood disturbance, and anxiety: Secondary | ICD-10-CM

## 2018-11-29 NOTE — Progress Notes (Signed)
Virtual Visit via Video Note The purpose of this virtual visit is to provide medical care while limiting exposure to the novel coronavirus.    Consent was obtained for video visit:  Yes.   Answered questions that patient had about telehealth interaction:  Yes.   I discussed the limitations, risks, security and privacy concerns of performing an evaluation and management service by telemedicine. I also discussed with the patient that there may be a patient responsible charge related to this service. The patient expressed understanding and agreed to proceed.  Pt location: Home Physician Location: office Name of referring provider:  [name], NP I connected with Carron Curie at patients initiation/request on 11/29/2018 at  2:30 PM EDT by video enabled telemedicine application and verified that I am speaking with the correct person using two identifiers. Pt MRN:  [id number] Pt DOB:  03-14-1926 Video Participants:  Carron Curie;  [name] (wife)   History of Present Illness: The patient was last seen in October 2019 for moderate dementia without behavioral disturbance. His wife is present during the e-visit to provide information. MMSE 21/30 in October 2019 (April 2019 was 21/30 (23/30 in April 2018, 22/30 in October 2017, 20/30 in April 2017, 24/30 in October 2016). He has declined dementia medications in the past and is not on any prescription medication except for his eye drops. He had a rocky course the past 4 months in and out of the hospital. His wife reports he is doing much better, he is in good spirits and is gaining weight. His appetite and sleep are good. His wife is his "private nurse" and manages finances, meals, dressing and helps with bathing. He denies any headaches, dizziness, vision changes, focal numbness/tingling/weakness, no recent falls. No paranoia or hallucinations.   Current Outpatient Medications on File Prior to Visit  Medication Sig Dispense Refill   . acetaminophen (TYLENOL) 500 MG tablet Take 500 mg by mouth as needed.    Marland Kitchen aspirin EC 81 MG tablet Take 81 mg by mouth daily.    . brimonidine (ALPHAGAN) 0.2 % ophthalmic solution Place 1 drop into the left eye Twice daily.     . clotrimazole (LOTRIMIN) 1 % cream Apply 1 application topically 2 (two) times daily as needed.    . docusate sodium (COLACE) 100 MG capsule Take 1 capsule (100 mg total) by mouth daily. (Patient not taking: Reported on 11/28/2018) 30 capsule 0  . dorzolamide-timolol (COSOPT) 22.3-6.8 MG/ML ophthalmic solution Place 1 drop into the left eye 2 (two) times daily.    . famotidine (PEPCID) 20 MG tablet Take 20 mg by mouth at bedtime as needed for heartburn or indigestion.    . montelukast (SINGULAIR) 5 MG chewable tablet Chew 5 mg by mouth as needed.    . Multiple Vitamin (MULTIVITAMIN) tablet Take 1 tablet by mouth daily.    . polyethylene glycol (MIRALAX / GLYCOLAX) 17 g packet Take 17 g by mouth daily.     No current facility-administered medications on file prior to visit.      Observations/Objective:   GEN:  The patient appears stated age and is in NAD.  Neurological examination: Patient is awake, alert, oriented to person, place. No aphasia or dysarthria. Intact fluency and comprehension. Remote and recent memory impaired. Able to name, difficulty with repetition. Cranial nerves: Extraocular movements intact with no nystagmus. No facial asymmetry. Motor: moves all extremities symmetrically, at least anti-gravity x 4. No incoordination on finger to nose testing.   [place]  Cognitive Assessment Blind 11/28/2018 (done over phone)  Attention: Read list of digits (0/2) 2  Attention: Read list of letters (0/1) 1  Attention: Serial 7 subtraction starting at 100 (0/3) 0  Language: Repeat phrase (0/2) 0  Language : Fluency (0/1) 0  Abstraction (0/2) 0  Delayed Recall (0/5) 0  Orientation (0/6) 2  Total 5  Adjusted Score (based on education) 6/22    Assessment and  Plan:   This is a very pleasant 85 yo RH man with no significant past medical history, with moderate vascular dementia without behavioral disturbance. [place] (done over phone) today 6/22 (MMSE 21/30 in October 2019). This appears to be a significant decline, he has been in and out of the hospital the past few months. He has been doing better physically, he continues to be pleasant and denies any complaints. He has declined dementia medications in the past and we have agreed to hold off at this point. Continue 24/7 care. He does not drive. Continue control of vascular risk factors, physical exercise, and brain stimulation exercises for brain health. He will follow-up in 6 months or earlier if needed.    Follow Up Instructions:    -I discussed the assessment and treatment plan with the patient. The patient was provided an opportunity to ask questions and all were answered. The patient agreed with the plan and demonstrated an understanding of the instructions.   The patient was advised to call back or seek an in-person evaluation if the symptoms worsen or if the condition fails to improve as anticipated.    [name], MD

## 2018-12-01 ENCOUNTER — Other Ambulatory Visit: Payer: Self-pay | Admitting: Nurse Practitioner

## 2018-12-01 DIAGNOSIS — D649 Anemia, unspecified: Secondary | ICD-10-CM

## 2018-12-02 NOTE — Telephone Encounter (Signed)
Patient was seen on 11/07/2018 and number 30 with no refills provided. Please advise if ok to give additional refills.  Forwarded to [name], NP

## 2018-12-21 DIAGNOSIS — H409 Unspecified glaucoma: Secondary | ICD-10-CM

## 2018-12-21 DIAGNOSIS — N401 Enlarged prostate with lower urinary tract symptoms: Secondary | ICD-10-CM

## 2018-12-21 DIAGNOSIS — N183 Chronic kidney disease, stage 3 (moderate): Secondary | ICD-10-CM

## 2018-12-21 DIAGNOSIS — E538 Deficiency of other specified B group vitamins: Secondary | ICD-10-CM

## 2018-12-21 DIAGNOSIS — I129 Hypertensive chronic kidney disease with stage 1 through stage 4 chronic kidney disease, or unspecified chronic kidney disease: Secondary | ICD-10-CM

## 2018-12-21 DIAGNOSIS — F0390 Unspecified dementia without behavioral disturbance: Secondary | ICD-10-CM

## 2018-12-21 DIAGNOSIS — R338 Other retention of urine: Secondary | ICD-10-CM

## 2018-12-21 DIAGNOSIS — K5730 Diverticulosis of large intestine without perforation or abscess without bleeding: Secondary | ICD-10-CM

## 2019-01-29 ENCOUNTER — Other Ambulatory Visit: Payer: Self-pay

## 2019-01-29 ENCOUNTER — Ambulatory Visit (INDEPENDENT_AMBULATORY_CARE_PROVIDER_SITE_OTHER): Payer: Medicare Other | Admitting: Nurse Practitioner

## 2019-01-29 ENCOUNTER — Encounter: Payer: Self-pay | Admitting: Nurse Practitioner

## 2019-01-29 VITALS — BP 112/68 | HR 56 | Temp 97.7°F | Ht 67.0 in | Wt 129.0 lb

## 2019-01-29 DIAGNOSIS — N138 Other obstructive and reflux uropathy: Secondary | ICD-10-CM

## 2019-01-29 DIAGNOSIS — K5903 Drug induced constipation: Secondary | ICD-10-CM

## 2019-01-29 DIAGNOSIS — K219 Gastro-esophageal reflux disease without esophagitis: Secondary | ICD-10-CM

## 2019-01-29 DIAGNOSIS — F0390 Unspecified dementia without behavioral disturbance: Secondary | ICD-10-CM

## 2019-01-29 DIAGNOSIS — N183 Chronic kidney disease, stage 3 unspecified: Secondary | ICD-10-CM

## 2019-01-29 DIAGNOSIS — R5381 Other malaise: Secondary | ICD-10-CM

## 2019-01-29 DIAGNOSIS — D649 Anemia, unspecified: Secondary | ICD-10-CM | POA: Diagnosis not present

## 2019-01-29 DIAGNOSIS — K7689 Other specified diseases of liver: Secondary | ICD-10-CM

## 2019-01-29 DIAGNOSIS — N401 Enlarged prostate with lower urinary tract symptoms: Secondary | ICD-10-CM

## 2019-01-29 DIAGNOSIS — Z23 Encounter for immunization: Secondary | ICD-10-CM

## 2019-01-29 DIAGNOSIS — R634 Abnormal weight loss: Secondary | ICD-10-CM

## 2019-01-29 DIAGNOSIS — L821 Other seborrheic keratosis: Secondary | ICD-10-CM

## 2019-01-29 LAB — COMPLETE METABOLIC PANEL WITH GFR
AG Ratio: 1.1 (calc) (ref 1.0–2.5)
ALT: 12 U/L (ref 9–46)
AST: 21 U/L (ref 10–35)
Albumin: 3.9 g/dL (ref 3.6–5.1)
Alkaline phosphatase (APISO): 155 U/L — ABNORMAL HIGH (ref 35–144)
BUN/Creatinine Ratio: 16 (calc) (ref 6–22)
BUN: 18 mg/dL (ref 7–25)
CO2: 29 mmol/L (ref 20–32)
Calcium: 9.8 mg/dL (ref 8.6–10.3)
Chloride: 104 mmol/L (ref 98–110)
Creat: 1.16 mg/dL — ABNORMAL HIGH (ref 0.70–1.11)
GFR, Est African American: 64 mL/min/{1.73_m2} (ref >=60)
GFR, Est Non African American: 55 mL/min/{1.73_m2} — ABNORMAL LOW (ref >=60)
Globulin: 3.4 g/dL (calc) (ref 1.9–3.7)
Glucose, Bld: 83 mg/dL (ref 65–99)
Potassium: 4.3 mmol/L (ref 3.5–5.3)
Sodium: 139 mmol/L (ref 135–146)
Total Bilirubin: 0.5 mg/dL (ref 0.2–1.2)
Total Protein: 7.3 g/dL (ref 6.1–8.1)

## 2019-01-29 LAB — CBC WITH DIFFERENTIAL/PLATELET
Absolute Monocytes: 382 cells/uL (ref 200–950)
Basophils Absolute: 51 cells/uL (ref 0–200)
Basophils Relative: 1.1 %
Eosinophils Absolute: 202 cells/uL (ref 15–500)
Eosinophils Relative: 4.4 %
HCT: 38.7 % (ref 38.5–50.0)
Hemoglobin: 12.9 g/dL — ABNORMAL LOW (ref 13.2–17.1)
Lymphs Abs: 2259 cells/uL (ref 850–3900)
MCH: 30.9 pg (ref 27.0–33.0)
MCHC: 33.3 g/dL (ref 32.0–36.0)
MCV: 92.6 fL (ref 80.0–100.0)
MPV: 10.9 fL (ref 7.5–12.5)
Monocytes Relative: 8.3 %
Neutro Abs: 1707 cells/uL (ref 1500–7800)
Neutrophils Relative %: 37.1 %
Platelets: 155 10*3/uL (ref 140–400)
RBC: 4.18 10*6/uL — ABNORMAL LOW (ref 4.20–5.80)
RDW: 13.1 % (ref 11.0–15.0)
Total Lymphocyte: 49.1 %
WBC: 4.6 10*3/uL (ref 3.8–10.8)

## 2019-01-29 MED ORDER — FERROUS SULFATE 325 (65 FE) MG PO TABS: ORAL_TABLET | ORAL | 1 refills | Status: DC

## 2019-01-29 NOTE — Progress Notes (Signed)
Careteam: [name], NP as PCP - General (Geriatric Medicine) [name], MD as Consulting Physician (Ophthalmology) [name], MD as Consulting Physician (Neurology) [name], MD as Consulting Physician (Pulmonary Disease)  Advanced Directive information    No Known Allergies  Chief Complaint  Patient presents with  . Medical Management of Chronic Issues    3 month follow-up   . Nevus    Examine moles on back, discolored. [place] recommended that moles be examined  . Pruritis    Itching on back and groin area, using hydrocortisone cream. Patient has a foley cath (FYI). Patient will see Urologist this Friday gfor cath change  . Immunizations    Flu vaccine and discuss 2nd shingrix vaccine   . Medication Refill    Refill Iron Supp at [facility] in [place]     HPI: Patient is a 85 y.o. male seen in the office today for routine follow up.   Urinary retention/bph- continues with chronic foley and urology follow up for exchanged.   Weight loss/fTT- eating well, has gained 10 lbs. Strength is up but still weak overall.   weakness improved but uses assistance, continues to participate in PT  Constipation- maintained on with miralax 17 gm PRN  GERD-occasional symptoms, will use pepcid if he starts burping.   Anemia- will need follow up cbc, continues on iron daily   Dementia- advanced, followed by neurology   Slight redness in left eye that started after sitting outside. No drainage or itching. 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   Review of Systems:  Review of Systems  Unable to perform ROS: Dementia    Past Medical History:  Diagnosis Date  . BPH (benign prostatic hypertrophy)   . Cataract    Both  . Dementia ([place])   . Diverticula of colon   . Elevated lactic acid level   . Gallbladder polyp   . Glaucoma 1986   Dr. Mila Merry  . Gram negative sepsis ([place])   . Hearing loss   . Hx of  adenomatous colonic polyps   . Internal hemorrhoids   . Left varicocele   . Liver cyst   . Obesity   . Pseudodementia   . RBBB (right bundle branch block)   . Urine retention   . UTI (urinary tract infection)    Past Surgical History:  Procedure Laterality Date  . CATARACT EXTRACTION Right   . GLAUCOMA REPAIR Left   . KNEE SURGERY    . TRANSURETHRAL RESECTION OF PROSTATE  07/16/2018   [facility]   Social History:   reports that he quit smoking about 63 years ago. His smoking use included cigarettes. He has a 3.00 pack-year smoking history. He has never used smokeless tobacco. He reports that he does not drink alcohol or use drugs.  Family History  Problem Relation Age of Onset  . Heart disease Father 83  . Parkinson's disease Father   . Cancer Mother        ? type  . Colon cancer Neg Hx     Medications: Patient's Medications  New Prescriptions   No medications on file  Previous Medications   ACETAMINOPHEN (TYLENOL) 500 MG TABLET    Take 500 mg by mouth as needed.   ASPIRIN EC 81 MG TABLET    Take 81 mg by mouth daily.   BRIMONIDINE (ALPHAGAN) 0.2 % OPHTHALMIC SOLUTION    Place 1 drop into  the left eye Twice daily.    CLOTRIMAZOLE (LOTRIMIN) 1 % CREAM    Apply 1 application topically 2 (two) times daily as needed.   DOCUSATE SODIUM (COLACE) 100 MG CAPSULE    Take 100 mg by mouth daily as needed for mild constipation.   DORZOLAMIDE-TIMOLOL (COSOPT) 22.3-6.8 MG/ML OPHTHALMIC SOLUTION    Place 1 drop into the left eye 2 (two) times daily.   FAMOTIDINE (PEPCID) 20 MG TABLET    Take 20 mg by mouth at bedtime as needed for heartburn or indigestion.   MONTELUKAST (SINGULAIR) 5 MG CHEWABLE TABLET    Chew 5 mg by mouth as needed.   MULTIPLE VITAMIN (MULTIVITAMIN) TABLET    Take 1 tablet by mouth daily.   POLYETHYLENE GLYCOL (MIRALAX / GLYCOLAX) 17 G PACKET    Take 17 g by mouth daily.  Modified Medications   Modified Medication Previous Medication   FERROUS SULFATE 325 (65 FE)  MG TABLET ferrous sulfate 325 (65 FE) MG tablet      TAKE 1 TABLET BY MOUTH EVERY DAY WITH BREAKFAST    TAKE 1 TABLET BY MOUTH EVERY DAY WITH BREAKFAST  Discontinued Medications   DOCUSATE SODIUM (COLACE) 100 MG CAPSULE    Take 1 capsule (100 mg total) by mouth daily.    Physical Exam:  Vitals:   01/29/19 1035  BP: 112/68  Pulse: (!) 56  Temp: 97.7 F (36.5 C)  TempSrc: Oral  SpO2: 91%  Weight: 129 lb (58.5 kg)  Height: '5\' 7"'  (1.702 m)   Body mass index is 20.2 kg/m. Wt Readings from Last 3 Encounters:  01/29/19 129 lb (58.5 kg)  11/28/18 115 lb (52.2 kg)  11/07/18 116 lb (52.6 kg)    Physical Exam Exam conducted with a chaperone present.  Constitutional:      Appearance: He is well-developed.     Comments: Thin frail male, NAD  Eyes:     General: No scleral icterus.    Pupils: Pupils are equal, round, and reactive to light.     Comments: OS corneal clouding  Neck:     Musculoskeletal: Neck supple.     Thyroid: No thyromegaly.     Vascular: No carotid bruit.  Cardiovascular:     Rate and Rhythm: Normal rate and regular rhythm.     Heart sounds: Murmur (3/6 SEM) present. No friction rub. No gallop.   Pulmonary:     Effort: Pulmonary effort is normal.     Breath sounds: Normal breath sounds. No wheezing or rales.  Chest:     Chest wall: No tenderness.  Abdominal:     General: Bowel sounds are normal. There is no distension or abdominal bruit.     Palpations: Abdomen is soft. There is no hepatomegaly, mass or pulsatile mass.     Tenderness: There is no abdominal tenderness. There is no guarding or rebound.  Lymphadenopathy:     Cervical: No cervical adenopathy.  Skin:    General: Skin is warm and dry.     Findings: No rash.     Comments:  Seborrheic keratosis scattered on back  Neurological:     Mental Status: He is alert.     Labs reviewed: Basic Metabolic Panel: Recent Labs    05/28/18 1631 10/24/18 1010 10/29/18 1047 11/07/18 1136  NA  --  137  139 139  K  --  4.4 4.1 4.1  CL  --  99 101 103  CO2  --  '31 28 27  ' GLUCOSE  --  95 93 91  BUN  --  26* 26* 15  CREATININE  --  2.47* 2.34* 1.53*  CALCIUM  --  9.9 9.6 9.6  TSH 4.520*  --   --   --    Liver Function Tests: Recent Labs    04/02/18 04/16/18 1408 10/24/18 1010  AST '21 22 20  ' ALT '11 11 13  ' ALKPHOS 179*  --   --   BILITOT  --  0.6 0.5  PROT  --  6.4 7.7   No results for input(s): LIPASE, AMYLASE in the last 8760 hours. No results for input(s): AMMONIA in the last 8760 hours. CBC: Recent Labs    04/05/18 04/16/18 1408 10/24/18 1010  WBC 9.9 5.6 6.8  NEUTROABS  --  4,217 4,529  HGB 10.5* 11.4* 11.0*  HCT 31* 33.2* 32.8*  MCV  --  87.4 88.6  PLT 88* 430* 354   Lipid Panel: No results for input(s): CHOL, HDL, LDLCALC, TRIG, CHOLHDL, LDLDIRECT in the last 8760 hours. TSH: Recent Labs    05/28/18 1631  TSH 4.520*   A1C: No results found for: HGBA1C   Assessment/Plan 1. Anemia, unspecified type - ferrous sulfate 325 (65 FE) MG tablet; TAKE 1 TABLET BY MOUTH EVERY DAY WITH BREAKFAST  Dispense: 90 tablet; Refill: 1 - CMP with eGFR([facility])  2. CKD (chronic kidney disease) stage 3, GFR 30-59 ml/min (HCC) -Encourage proper hydration and to avoid NSAIDS (Aleve, Advil, Motrin, Ibuprofen)  - CBC with Differential/Platelet  3. Drug-induced constipation Improved, uses miralax as needed  4. Debility Improving, continues to work with PT. Weight is up but still with weakness.  5. Dementia without behavioral disturbance, unspecified dementia type ([place]) -progressive decline. Wife is caregiver   6. Benign prostatic hyperplasia with urinary obstruction Stable with  chronic foley, urology following and has upcoming scheduled appt   7. Weight loss Stable he has recently gained weight and eating well.  8. Liver cyst -stable, evaluated by liver clinic and benign in appearence and therefore no other workup needed   9. Gastroesophageal reflux disease without  esophagitis Controlled, occasionally will use OTC  10. Seborrheic keratosis Noted on back, reassurance provided. To use Eucerin or cereve cream to back as needed for dry itchy skin  11. Need for immunization against influenza - Flu Vaccine QUAD High Dose(Fluad)    Next appt: 3 months  Honour Schwieger K. [place], [facility] [phone]

## 2019-01-29 NOTE — Patient Instructions (Addendum)
Contact [facility] to schedule an appointment for second Shingrix Vaccine @ [phone]  To use Eucerin or cereve cream to back as needed for dry itchy skin   Seborrheic Keratosis A seborrheic keratosis is a common, noncancerous (benign) skin growth. These growths are velvety, waxy, rough, tan, brown, or black spots that appear on the skin. These skin growths can be flat or raised, and scaly. What are the causes? The cause of this condition is not known. What increases the risk? You are more likely to develop this condition if you:  Have a family history of seborrheic keratosis.  Are 50 or older.  Are pregnant.  Have had estrogen replacement therapy. What are the signs or symptoms? Symptoms of this condition include growths on the face, chest, shoulders, back, or other areas. These growths:  Are usually painless, but may become irritated and itchy.  Can be yellow, brown, black, or other colors.  Are slightly raised or have a flat surface.  Are sometimes rough or wart-like in texture.  Are often velvety or waxy on the surface.  Are round or oval-shaped.  Often occur in groups, but may occur as a single growth. How is this diagnosed? This condition is diagnosed with a medical history and physical exam.  A sample of the growth may be tested (skin biopsy).  You may need to see a skin specialist (dermatologist). How is this treated? Treatment is not usually needed for this condition, unless the growths are irritated or bleed often.  You may also choose to have the growths removed if you do not like their appearance. ? Most commonly, these growths are treated with a procedure in which liquid nitrogen is applied to "freeze" off the growth (cryosurgery). ? They may also be burned off with electricity (electrocautery) or removed by scraping (curettage). Follow these instructions at home:  Watch your growth for any changes.  Keep all follow-up visits as told by  your health care provider. This is important.  Do not scratch or pick at the growth or growths. This can cause them to become irritated or infected. Contact a health care provider if:  You suddenly have many new growths.  Your growth bleeds, itches, or hurts.  Your growth suddenly becomes larger or changes color. Summary  A seborrheic keratosis is a common, noncancerous (benign) skin growth.  Treatment is not usually needed for this condition, unless the growths are irritated or bleed often.  Watch your growth for any changes.  Contact a health care provider if you suddenly have many new growths or your growth suddenly becomes larger or changes color.  Keep all follow-up visits as told by your health care provider. This is important. This information is not intended to replace advice given to you by your health care provider. Make sure you discuss any questions you have with your health care provider. Document Released: 07/01/2010 Document Revised: 10/11/2017 Document Reviewed: 10/11/2017 Elsevier Patient Education  2020 [employer].

## 2019-01-30 NOTE — Addendum Note (Signed)
Addended by: Logan Bores on: 01/30/2019 08:44 AM   Modules accepted: Orders

## 2019-02-11 ENCOUNTER — Other Ambulatory Visit: Payer: Self-pay | Admitting: *Deleted

## 2019-02-11 DIAGNOSIS — D649 Anemia, unspecified: Secondary | ICD-10-CM

## 2019-02-11 MED ORDER — FERROUS SULFATE 325 (65 FE) MG PO TABS: ORAL_TABLET | ORAL | 1 refills | Status: DC

## 2019-02-11 NOTE — Telephone Encounter (Signed)
Wife called and stated that pharmacy did not receive refill. Refaxed.

## 2019-02-19 DIAGNOSIS — Y92009 Unspecified place in unspecified non-institutional (private) residence as the place of occurrence of the external cause: Secondary | ICD-10-CM | POA: Insufficient documentation

## 2019-02-19 DIAGNOSIS — W19XXXA Unspecified fall, initial encounter: Secondary | ICD-10-CM

## 2019-02-19 HISTORY — DX: Unspecified place in unspecified non-institutional (private) residence as the place of occurrence of the external cause: Y92.009

## 2019-02-19 HISTORY — DX: Unspecified fall, initial encounter: W19.XXXA

## 2019-02-21 MED ORDER — ACETAMINOPHEN 325 MG PO TABS
650.00 | ORAL_TABLET | ORAL | Status: DC
Start: ? — End: 2019-02-21

## 2019-02-21 MED ORDER — FAMOTIDINE 20 MG PO TABS
20.00 | ORAL_TABLET | ORAL | Status: DC
Start: 2019-02-22 — End: 2019-02-21

## 2019-02-21 MED ORDER — SODIUM CHLORIDE 0.9 % IV SOLN
INTRAVENOUS | Status: DC
Start: ? — End: 2019-02-21

## 2019-02-21 MED ORDER — SOTRADECOL 1 % IV SOLN
325.00 | INTRAVENOUS | Status: DC
Start: 2019-02-22 — End: 2019-02-21

## 2019-02-21 MED ORDER — ENOXAPARIN SODIUM 40 MG/0.4ML ~~LOC~~ SOLN
40.00 | SUBCUTANEOUS | Status: DC
Start: 2019-02-22 — End: 2019-02-21

## 2019-02-21 MED ORDER — BRIMONIDINE TARTRATE 0.2 % OP SOLN
1.00 | OPHTHALMIC | Status: DC
Start: 2019-02-21 — End: 2019-02-21

## 2019-02-21 MED ORDER — ASPIRIN EC 81 MG PO TBEC
81.00 | DELAYED_RELEASE_TABLET | ORAL | Status: DC
Start: 2019-02-22 — End: 2019-02-21

## 2019-02-21 MED ORDER — TIMOLOL MALEATE 0.25 % OP SOLN
1.00 | OPHTHALMIC | Status: DC
Start: 2019-02-21 — End: 2019-02-21

## 2019-02-21 MED ORDER — GENERIC EXTERNAL MEDICATION
1.00 | Status: DC
Start: 2019-02-21 — End: 2019-02-21

## 2019-02-25 ENCOUNTER — Ambulatory Visit (INDEPENDENT_AMBULATORY_CARE_PROVIDER_SITE_OTHER): Payer: Medicare Other | Admitting: Nurse Practitioner

## 2019-02-25 ENCOUNTER — Encounter: Payer: Self-pay | Admitting: Nurse Practitioner

## 2019-02-25 ENCOUNTER — Telehealth: Payer: Self-pay | Admitting: *Deleted

## 2019-02-25 ENCOUNTER — Other Ambulatory Visit: Payer: Self-pay

## 2019-02-25 VITALS — BP 126/68 | HR 59 | Temp 97.7°F | Ht 67.0 in | Wt 131.2 lb

## 2019-02-25 DIAGNOSIS — T83511S Infection and inflammatory reaction due to indwelling urethral catheter, sequela: Secondary | ICD-10-CM

## 2019-02-25 DIAGNOSIS — N183 Chronic kidney disease, stage 3 unspecified: Secondary | ICD-10-CM

## 2019-02-25 DIAGNOSIS — L290 Pruritus ani: Secondary | ICD-10-CM

## 2019-02-25 DIAGNOSIS — F0390 Unspecified dementia without behavioral disturbance: Secondary | ICD-10-CM | POA: Diagnosis not present

## 2019-02-25 DIAGNOSIS — R55 Syncope and collapse: Secondary | ICD-10-CM

## 2019-02-25 DIAGNOSIS — B372 Candidiasis of skin and nail: Secondary | ICD-10-CM

## 2019-02-25 DIAGNOSIS — N390 Urinary tract infection, site not specified: Secondary | ICD-10-CM

## 2019-02-25 MED ORDER — NYSTATIN 100000 UNIT/GM EX POWD: Freq: Three times a day (TID) | CUTANEOUS | 2 refills | Status: DC

## 2019-02-25 NOTE — Telephone Encounter (Signed)
Transition Care Management Follow-up Telephone Call  Date of discharge and from where: 02/21/19 [facility]  How have you been since you were released from the hospital? A lot better  Any questions or concerns? Yes  Concerns of diarrhea  Items Reviewed:  Did the pt receive and understand the discharge instructions provided? Yes   Medications obtained and verified? Yes   Any new allergies since your discharge? No   Dietary orders reviewed? Yes  Do you have support at home? Yes   Other (ie: DME, Home Health, etc) Home Health  Functional Questionnaire: (I = Independent and D = Dependent) ADL's: I with assistance  Bathing/Dressing- I with assistance   Meal Prep- D wife  Eating- I  Maintaining continence- I  Transferring/Ambulation- I with assistance  Managing Meds- I with assistance   Follow up appointments reviewed:    PCP Hospital f/u appt confirmed? Yes  Scheduled to see [name] on 02/25/19 @ 3:15.  [facility] f/u appt confirmed? Yes    Are transportation arrangements needed? No   If their condition worsens, is the pt aware to call  their PCP or go to the ED? Yes  Was the patient provided with contact information for the PCP's office or ED? Yes  Was the pt encouraged to call back with questions or concerns? Yes

## 2019-02-25 NOTE — Patient Instructions (Addendum)
START probiotic (over the counter at drug store- florastor) twice daily for 7 days and start eating yogurt daily  To use nystatin powder to groin area 2-3 times daily as needed for rash  To make sure his rectum is cleaned well after bowel movement, monitor for hemorrhoids, can use preparation H if needed

## 2019-02-25 NOTE — Progress Notes (Signed)
Careteam: [name], NP as PCP - General (Geriatric Medicine) [name], MD as Consulting Physician (Ophthalmology) [name], MD as Consulting Physician (Neurology) [name], MD as Consulting Physician (Pulmonary Disease)  Advanced Directive information Does Patient Have a Medical Advance Directive?: No(Wife would like paperwork to start this.)  No Known Allergies  Chief Complaint  Patient presents with  . Transitions Of Care    Followup of hospitalization at [facility] 9/9-9/11/20     HPI: Patient is a 85 y.o. male seen in the office today for hospital follow up.  Pt with a past medical history of advanced dementia, BPH with indwelling catheter, bundle branch block, history of nonsustained VT, admitted to hospital for syncopal episode at home   It was felt like the syncopal episode could have been multifactorial-infection versus rhythm related, he does have a history of nonsustained VT with conservative management plan as per outpatient cardiologist, he was given IV fluid. He had a no episode of syncope while he was there. As per wife his mental status has been improved. His echo was normal. He has follow up with cardiologist later this week.   Found to have UTI-has chronic indwelling catheter, urine culture grew E. Coli, treated with IV rocephin, pending sensitivity( delayed result due to issue of contamination at lab) he was discharged him on oral ciprolfloxacin. Wife reports he was back to his baseline on discharge. Ciprofloxacin with intermediate susceptibility noted.   He is feeling much better, eating well, and remains alert. Overall getting better. Last night he had 2 episodes of diarrhea. No miralax or stool softener recently. Not on probiotics  Reports itchy rash to groin area.   Wife states he complains of rectum itching   Review of Systems:  Review of Systems  Unable to perform ROS: Dementia    Past Medical  History:  Diagnosis Date  . BPH (benign prostatic hypertrophy)   . Cataract    Both  . Dementia ([place])   . Diverticula of colon   . Elevated lactic acid level   . Gallbladder polyp   . Glaucoma 1986   Dr. Mila Merry  . Gram negative sepsis ([place])   . Hearing loss   . Hx of adenomatous colonic polyps   . Internal hemorrhoids   . Left varicocele   . Liver cyst   . Obesity   . Pseudodementia   . RBBB (right bundle branch block)   . Urine retention   . UTI (urinary tract infection)    Past Surgical History:  Procedure Laterality Date  . CATARACT EXTRACTION Right   . GLAUCOMA REPAIR Left   . KNEE SURGERY    . TRANSURETHRAL RESECTION OF PROSTATE  07/16/2018   [facility]   Social History:   reports that he quit smoking about 63 years ago. His smoking use included cigarettes. He has a 3.00 pack-year smoking history. He has never used smokeless tobacco. He reports that he does not drink alcohol or use drugs.  Family History  Problem Relation Age of Onset  . Heart disease Father 31  . Parkinson's disease Father   . Cancer Mother        ? type  . Colon cancer Neg Hx     Medications: Patient's Medications  New Prescriptions   No medications on file  Previous Medications   ACETAMINOPHEN (TYLENOL) 500 MG TABLET    Take 500 mg by mouth as needed.   ASPIRIN EC  81 MG TABLET    Take 81 mg by mouth daily.   BRIMONIDINE (ALPHAGAN) 0.2 % OPHTHALMIC SOLUTION    Place 1 drop into the left eye Twice daily.    CIPROFLOXACIN (CIPRO) 250 MG TABLET    Take 250 mg by mouth 2 (two) times daily. x7 days, [facility] at [facility]   CLOTRIMAZOLE (LOTRIMIN) 1 % CREAM    Apply 1 application topically 2 (two) times daily as needed.   DOCUSATE SODIUM (COLACE) 100 MG CAPSULE    Take 100 mg by mouth daily as needed for mild constipation.   DORZOLAMIDE-TIMOLOL (COSOPT) 22.3-6.8 MG/ML OPHTHALMIC SOLUTION    Place 1 drop into the left eye 2 (two) times daily.   FAMOTIDINE (PEPCID) 20 MG TABLET    Take 20 mg by  mouth at bedtime as needed for heartburn or indigestion.   FERROUS SULFATE 325 (65 FE) MG TABLET    TAKE 1 TABLET BY MOUTH EVERY DAY WITH BREAKFAST   MULTIPLE VITAMIN (MULTIVITAMIN) TABLET    Take 1 tablet by mouth daily.   POLYETHYLENE GLYCOL (MIRALAX / GLYCOLAX) 17 G PACKET    Take 17 g by mouth daily.  Modified Medications   No medications on file  Discontinued Medications   MONTELUKAST (SINGULAIR) 5 MG CHEWABLE TABLET    Chew 5 mg by mouth as needed.    Physical Exam:  Vitals:   02/25/19 1508  BP: 126/68  Pulse: (!) 59  Temp: 97.7 F (36.5 C)  TempSrc: Oral  SpO2: 90%  Weight: 131 lb 3.2 oz (59.5 kg)  Height: 5\' 7"  (1.702 m)   Body mass index is 20.55 kg/m. Wt Readings from Last 3 Encounters:  02/25/19 131 lb 3.2 oz (59.5 kg)  01/29/19 129 lb (58.5 kg)  11/28/18 115 lb (52.2 kg)    Physical Exam Exam conducted with a chaperone present.  Constitutional:      Appearance: He is well-developed.     Comments: Thin frail male, NAD  HENT:     Head: Normocephalic and atraumatic.  Eyes:     General: No scleral icterus.    Pupils: Pupils are equal, round, and reactive to light.     Comments: OS corneal clouding  Neck:     Musculoskeletal: Neck supple.     Thyroid: No thyromegaly.     Vascular: No carotid bruit.  Cardiovascular:     Rate and Rhythm: Normal rate and regular rhythm.     Heart sounds: Murmur (3/6 SEM) present. No friction rub. No gallop.   Pulmonary:     Effort: Pulmonary effort is normal.     Breath sounds: Normal breath sounds. No wheezing or rales.  Chest:     Chest wall: No tenderness.  Abdominal:     General: Bowel sounds are normal. There is no distension or abdominal bruit.     Palpations: Abdomen is soft. There is no hepatomegaly, mass or pulsatile mass.     Tenderness: There is no abdominal tenderness. There is no guarding or rebound.  Genitourinary:    Pubic Area: Rash present.     Penis: Normal.      Rectum: Normal.  Lymphadenopathy:      Cervical: No cervical adenopathy.  Skin:    General: Skin is warm and dry.     Findings: No rash.     Comments:  Seborrheic keratosis scattered on back  Neurological:     Mental Status: He is alert.     Labs reviewed: Basic Metabolic Panel: Recent  Labs    05/28/18 1631  10/29/18 1047 11/07/18 1136 01/29/19 1121  NA  --    < > 139 139 139  K  --    < > 4.1 4.1 4.3  CL  --    < > 101 103 104  CO2  --    < > 28 27 29   GLUCOSE  --    < > 93 91 83  BUN  --    < > 26* 15 18  CREATININE  --    < > 2.34* 1.53* 1.16*  CALCIUM  --    < > 9.6 9.6 9.8  TSH 4.520*  --   --   --   --    < > = values in this interval not displayed.   Liver Function Tests: Recent Labs    04/02/18 04/16/18 1408 10/24/18 1010 01/29/19 1121  AST 21 22 20 21   ALT 11 11 13 12   ALKPHOS 179*  --   --   --   BILITOT  --  0.6 0.5 0.5  PROT  --  6.4 7.7 7.3   No results for input(s): LIPASE, AMYLASE in the last 8760 hours. No results for input(s): AMMONIA in the last 8760 hours. CBC: Recent Labs    04/16/18 1408 10/24/18 1010 01/29/19 1121  WBC 5.6 6.8 4.6  NEUTROABS 4,217 4,529 1,707  HGB 11.4* 11.0* 12.9*  HCT 33.2* 32.8* 38.7  MCV 87.4 88.6 92.6  PLT 430* 354 155   Lipid Panel: No results for input(s): CHOL, HDL, LDLCALC, TRIG, CHOLHDL, LDLDIRECT in the last 8760 hours. TSH: Recent Labs    05/28/18 1631  TSH 4.520*   A1C: No results found for: HGBA1C   Assessment/Plan 1. CKD (chronic kidney disease) stage 3, GFR 30-59 ml/min (HCC) -continue to push fluids - BASIC METABOLIC PANEL WITH GFR  2. Dementia without behavioral disturbance, unspecified dementia type ([place]) Ongoing and back to baseline.  3. Syncope, unspecified syncope type No further episodes noted, has follow up with cardiology scheduled for this week  - CBC with Differential/Platelet - BASIC METABOLIC PANEL WITH GFR  4. Urinary tract infection associated with indwelling urethral catheter, sequela -continues on  cipro, continues to improve, some diarrhea noted over night. To hold any stool softeners or laxatives at this time. To add probiotic and yogurt - CBC with Differential/Platelet - BASIC METABOLIC PANEL WITH GFR  5. Yeast dermatitis To groin. To wash and dry area thoroughly then to apply powder - nystatin (MYCOSTATIN/NYSTOP) powder; Apply topically 3 (three) times daily.  Dispense: 15 g; Refill: 2  6. Rectal itching No hemorrhoids noted. Educated wife to clean rectal area good after BM. To monitor for hemorrhoids and can use prep H as needed if needed  Next appt: 03/03/2019 for AWV Flavio Lindroth K. [place], [facility] [phone]

## 2019-02-26 DIAGNOSIS — F0390 Unspecified dementia without behavioral disturbance: Secondary | ICD-10-CM

## 2019-02-26 DIAGNOSIS — N3000 Acute cystitis without hematuria: Secondary | ICD-10-CM

## 2019-02-26 DIAGNOSIS — K5730 Diverticulosis of large intestine without perforation or abscess without bleeding: Secondary | ICD-10-CM

## 2019-02-26 DIAGNOSIS — I129 Hypertensive chronic kidney disease with stage 1 through stage 4 chronic kidney disease, or unspecified chronic kidney disease: Secondary | ICD-10-CM

## 2019-02-26 DIAGNOSIS — R338 Other retention of urine: Secondary | ICD-10-CM

## 2019-02-26 DIAGNOSIS — N183 Chronic kidney disease, stage 3 (moderate): Secondary | ICD-10-CM

## 2019-02-26 DIAGNOSIS — N138 Other obstructive and reflux uropathy: Secondary | ICD-10-CM

## 2019-02-26 DIAGNOSIS — N401 Enlarged prostate with lower urinary tract symptoms: Secondary | ICD-10-CM

## 2019-02-26 LAB — CBC WITH DIFFERENTIAL/PLATELET
Absolute Monocytes: 468 cells/uL (ref 200–950)
Basophils Absolute: 41 cells/uL (ref 0–200)
Basophils Relative: 0.9 %
Eosinophils Absolute: 140 cells/uL (ref 15–500)
Eosinophils Relative: 3.1 %
HCT: 39.7 % (ref 38.5–50.0)
Hemoglobin: 13.4 g/dL (ref 13.2–17.1)
Lymphs Abs: 1818 cells/uL (ref 850–3900)
MCH: 31.1 pg (ref 27.0–33.0)
MCHC: 33.8 g/dL (ref 32.0–36.0)
MCV: 92.1 fL (ref 80.0–100.0)
MPV: 10.3 fL (ref 7.5–12.5)
Monocytes Relative: 10.4 %
Neutro Abs: 2034 cells/uL (ref 1500–7800)
Neutrophils Relative %: 45.2 %
Platelets: 201 10*3/uL (ref 140–400)
RBC: 4.31 10*6/uL (ref 4.20–5.80)
RDW: 11.8 % (ref 11.0–15.0)
Total Lymphocyte: 40.4 %
WBC: 4.5 10*3/uL (ref 3.8–10.8)

## 2019-02-26 LAB — BASIC METABOLIC PANEL WITH GFR
BUN/Creatinine Ratio: 15 (calc) (ref 6–22)
BUN: 19 mg/dL (ref 7–25)
CO2: 28 mmol/L (ref 20–32)
Calcium: 9.6 mg/dL (ref 8.6–10.3)
Chloride: 102 mmol/L (ref 98–110)
Creat: 1.26 mg/dL — ABNORMAL HIGH (ref 0.70–1.11)
GFR, Est African American: 58 mL/min/{1.73_m2} — ABNORMAL LOW (ref >=60)
GFR, Est Non African American: 50 mL/min/{1.73_m2} — ABNORMAL LOW (ref >=60)
Glucose, Bld: 83 mg/dL (ref 65–139)
Potassium: 4.2 mmol/L (ref 3.5–5.3)
Sodium: 139 mmol/L (ref 135–146)

## 2019-02-27 ENCOUNTER — Encounter: Payer: Self-pay | Admitting: Cardiology

## 2019-02-27 ENCOUNTER — Ambulatory Visit (INDEPENDENT_AMBULATORY_CARE_PROVIDER_SITE_OTHER): Payer: Medicare Other | Admitting: Cardiology

## 2019-02-27 ENCOUNTER — Other Ambulatory Visit: Payer: Self-pay

## 2019-02-27 VITALS — BP 126/64 | HR 73 | Ht 67.0 in | Wt 130.0 lb

## 2019-02-27 DIAGNOSIS — R339 Retention of urine, unspecified: Secondary | ICD-10-CM

## 2019-02-27 DIAGNOSIS — I4510 Unspecified right bundle-branch block: Secondary | ICD-10-CM | POA: Diagnosis not present

## 2019-02-27 DIAGNOSIS — I472 Ventricular tachycardia: Secondary | ICD-10-CM | POA: Diagnosis not present

## 2019-02-27 DIAGNOSIS — R55 Syncope and collapse: Secondary | ICD-10-CM | POA: Diagnosis not present

## 2019-02-27 DIAGNOSIS — I4729 Other ventricular tachycardia: Secondary | ICD-10-CM

## 2019-02-27 NOTE — Progress Notes (Signed)
Cardiology Office Note:    Date:  02/27/2019   ID:  [name], DOB January 06, 1926, MRN [id number]  PCP:  [name], NP  Cardiologist:  Jenne Campus, MD    Referring MD: [name], NP   Chief Complaint  Patient presents with  . Follow-up  Doing better  History of Present Illness:    [name] is a 85 y.o. male with history of right bundle branch block and a stated reason for initial evaluation.  Recently he got a lot of setbacks.  He does have recurrent urinary tract infection also some joint infection all distinct make him tired weak and exhausted he lost significant amount of weight but now slowly gradually getting better.  Recently he end up going to emergency room in the [facility] secondary to subluxed of syncope he was profoundly weak and tired.  Echocardiogram being done which showed preserved left ventricular ejection fraction.  He was also told to have slow heart rate but above 50.  His wife was like always this with him and she is very concerned about it.  Past Medical History:  Diagnosis Date  . BPH (benign prostatic hypertrophy)   . Cataract    Both  . Dementia ([place])   . Diverticula of colon   . Elevated lactic acid level   . Gallbladder polyp   . Glaucoma 1986   Dr. Mila Merry  . Gram negative sepsis ([place])   . Hearing loss   . Hx of adenomatous colonic polyps   . Internal hemorrhoids   . Left varicocele   . Liver cyst   . Obesity   . Pseudodementia   . RBBB (right bundle branch block)   . Urine retention   . UTI (urinary tract infection)     Past Surgical History:  Procedure Laterality Date  . CATARACT EXTRACTION Right   . GLAUCOMA REPAIR Left   . KNEE SURGERY    . TRANSURETHRAL RESECTION OF PROSTATE  07/16/2018   [facility]    Current Medications: Current Meds  Medication Sig  . acetaminophen (TYLENOL) 500 MG tablet Take 500 mg by mouth as needed.  Marland Kitchen aspirin EC 81 MG tablet Take 81 mg by mouth daily.  .  brimonidine (ALPHAGAN) 0.2 % ophthalmic solution Place 1 drop into the left eye Twice daily.   . ciprofloxacin (CIPRO) 250 MG tablet Take 250 mg by mouth 2 (two) times daily. x7 days, started at [facility]  . clotrimazole (LOTRIMIN) 1 % cream Apply 1 application topically 2 (two) times daily as needed.  . docusate sodium (COLACE) 100 MG capsule Take 100 mg by mouth daily as needed for mild constipation.  . dorzolamide-timolol (COSOPT) 22.3-6.8 MG/ML ophthalmic solution Place 1 drop into the left eye 2 (two) times daily.  . famotidine (PEPCID) 20 MG tablet Take 20 mg by mouth at bedtime as needed for heartburn or indigestion.  . ferrous sulfate 325 (65 FE) MG tablet TAKE 1 TABLET BY MOUTH EVERY DAY WITH BREAKFAST  . Multiple Vitamin (MULTIVITAMIN) tablet Take 1 tablet by mouth daily.  Marland Kitchen nystatin (MYCOSTATIN/NYSTOP) powder Apply topically 3 (three) times daily.  . polyethylene glycol (MIRALAX / GLYCOLAX) 17 g packet Take 17 g by mouth daily.     Allergies:   Patient has no known allergies.   Social History   Socioeconomic History  . Marital status: Married    Spouse name: Not on file  . Number of children: 4  . Years of education: Not  on file  . Highest education level: Not on file  Occupational History  . Not on file  Social Needs  . Financial resource strain: Not hard at all  . Food insecurity    Worry: Never true    Inability: Never true  . Transportation needs    Medical: No    Non-medical: No  Tobacco Use  . Smoking status: Former Smoker    Packs/day: 0.30    Years: 10.00    Pack years: 3.00    Types: Cigarettes    Quit date: 06/13/1955    Years since quitting: 63.7  . Smokeless tobacco: Never Used  Substance and Sexual Activity  . Alcohol use: No  . Drug use: No  . Sexual activity: Not Currently  Lifestyle  . Physical activity    Days per week: 5 days    Minutes per session: 20 min  . Stress: Only a little  Relationships  . Social connections    Talks on phone: More  than three times a week    Gets together: More than three times a week    Attends religious service: More than 4 times per year    Active member of club or organization: No    Attends meetings of clubs or organizations: Never    Relationship status: Married  Other Topics Concern  . Not on file  Social History Narrative   Social History      Diet?       Do you drink/eat things with caffeine? Coffee, tea      Marital status?           married                         What year were you married? 1957      Do you live in a house, apartment, assisted living, condo, trailer, etc.? house      Is it one or more stories? one      How many persons live in your home? 2      Do you have any pets in your home? (please list) no      Highest level of education completed? High school      Current or past profession: Camera operator      Do you exercise?           yes                           Type & how often? Walking everyday weather permitting      Do you have a living will? yes      Do you have a DNR form?      no                            If not, do you want to discuss one?      Do you have signed POA/HPOA for forms? yes      Functional Status      Do you have difficulty bathing or dressing yourself? no      Do you have difficulty preparing food or eating? no      Do you have difficulty managing your medications? no      Do you have difficulty managing your finances? yes      Do you have difficulty  affording your medications? no     Family History: The patient's family history includes Cancer in his mother; Heart disease (age of onset: 20) in his father; Parkinson's disease in his father. There is no history of Colon cancer. ROS:   Please see the history of present illness.    All 14 point review of systems negative except as described per history of present illness  EKGs/Labs/Other Studies Reviewed:      Recent Labs: 05/28/2018: TSH 4.520  01/29/2019: ALT 12 02/25/2019: BUN 19; Creat 1.26; Hemoglobin 13.4; Platelets 201; Potassium 4.2; Sodium 139  Recent Lipid Panel    Component Value Date/Time   CHOL 186 03/16/2017 0924   TRIG 49 03/16/2017 0924   TRIG 61 05/11/2006 1035   HDL 76 03/16/2017 0924   CHOLHDL 2.4 03/16/2017 0924   VLDL 12.6 09/15/2015 1359   LDLCALC 96 03/16/2017 0924   LDLDIRECT 106.9 12/26/2011 0946    Physical Exam:    VS:  BP 126/64   Pulse 73   Ht 5\' 7"  (1.702 m)   Wt 130 lb (59 kg)   SpO2 94%   BMI 20.36 kg/m     Wt Readings from Last 3 Encounters:  02/27/19 130 lb (59 kg)  02/25/19 131 lb 3.2 oz (59.5 kg)  01/29/19 129 lb (58.5 kg)     GEN:  Well nourished, well developed in no acute distress HEENT: Normal NECK: No JVD; No carotid bruits LYMPHATICS: No lymphadenopathy CARDIAC: RRR, no murmurs, no rubs, no gallops RESPIRATORY:  Clear to auscultation without rales, wheezing or rhonchi  ABDOMEN: Soft, non-tender, non-distended MUSCULOSKELETAL:  No edema; No deformity  SKIN: Warm and dry LOWER EXTREMITIES: no swelling NEUROLOGIC:  Alert and oriented x 3 PSYCHIATRIC:  Normal affect   ASSESSMENT:    1. Syncope, unspecified syncope type   2. Nonsustained ventricular tachycardia ([place])   3. BUNDLE BRANCH BLOCK, RIGHT   4. Urinary retention    PLAN:    In order of problems listed above:  1. Syncope questionable, I think it is important to put another monitor on him to see if there is any more significant arrhythmia.  Good news is the fact that the latest echocardiogram that he reviewed from [facility] showed preserved left ventricular ejection fraction. 2. Nonsustained ventricular tachycardia obviously concerning.  Left ventricular ejection fraction preserved overall because of comorbidity and fragile state I think conservative approach is in order. 3. Chronic right bundle branch block.  Monitor will be placed 4. Urinary retention.  That seems to be getting better.    Medication Adjustments/Labs and Tests Ordered: Current medicines are reviewed at length with the patient today.  Concerns regarding medicines are outlined above.  No orders of the defined types were placed in this encounter.  Medication changes: No orders of the defined types were placed in this encounter.   Signed, Park Liter, MD, [facility] 02/27/2019 11:15 AM    [place]

## 2019-02-27 NOTE — Addendum Note (Signed)
Addended by: Ashok Norris on: 02/27/2019 11:23 AM   Modules accepted: Orders

## 2019-02-27 NOTE — Patient Instructions (Signed)
Medication Instructions:  Your physician recommends that you continue on your current medications as directed. Please refer to the Current Medication list given to you today.  If you need a refill on your cardiac medications before your next appointment, please call your pharmacy.   Lab work: None.  If you have labs (blood work) drawn today and your tests are completely normal, you will receive your results only by: Marland Kitchen MyChart Message (if you have MyChart) OR . A paper copy in the mail If you have any lab test that is abnormal or we need to change your treatment, we will call you to review the results.  Testing/Procedures: Your physician has recommended that you wear a holter monitor. Holter monitors are medical devices that record the heart's electrical activity. Doctors most often use these monitors to diagnose arrhythmias. Arrhythmias are problems with the speed or rhythm of the heartbeat. The monitor is a small, portable device. You can wear one while you do your normal daily activities. This is usually used to diagnose what is causing palpitations/syncope (passing out). Wear for 7 days.      Follow-Up: At [facility], you and your health needs are our priority.  As part of our continuing mission to provide you with exceptional heart care, we have created designated Provider Care Teams.  These Care Teams include your primary Cardiologist (physician) and Advanced Practice Providers (APPs -  Physician Assistants and Nurse Practitioners) who all work together to provide you with the care you need, when you need it. You will need a follow up appointment in 5 months.  Please call our office 2 months in advance to schedule this appointment.  You may see No primary care provider on file. or another member of our [employer] Provider Team in [place]: Shirlee More, MD . Jyl Heinz, MD  Any Other Special Instructions Will Be Listed Below (If Applicable).

## 2019-03-03 ENCOUNTER — Other Ambulatory Visit: Payer: Self-pay

## 2019-03-03 ENCOUNTER — Ambulatory Visit (INDEPENDENT_AMBULATORY_CARE_PROVIDER_SITE_OTHER): Payer: Medicare Other | Admitting: Nurse Practitioner

## 2019-03-03 ENCOUNTER — Ambulatory Visit: Payer: Self-pay

## 2019-03-03 ENCOUNTER — Encounter: Payer: Self-pay | Admitting: Nurse Practitioner

## 2019-03-03 DIAGNOSIS — Z0000 Encounter for general adult medical examination without abnormal findings: Secondary | ICD-10-CM

## 2019-03-03 NOTE — Progress Notes (Signed)
    This service is provided via telemedicine  No vital signs collected/recorded due to the encounter was a telemedicine visit.   Location of patient (ex: home, work):  At daughter's house   Patient consents to a telephone visit: Yes  Location of the provider (ex: office, home): [facility], Office   Name of any referring provider:  N/A  Names of all persons participating in the telemedicine service and their role in the encounter: S.Chrae B/CMA, Sherrie Mustache, NP, Vincente Liberty (daughter), Spouse,  and Patient   Time spent on call: 11 min with medical assistant

## 2019-03-03 NOTE — Patient Instructions (Addendum)
[name] , Thank you for taking time to come for your Medicare Wellness Visit. I appreciate your ongoing commitment to your health goals. Please review the following plan we discussed and let me know if I can assist you in the future.   Screening recommendations/referrals: Colonoscopy aged out Recommended yearly ophthalmology/optometry visit for glaucoma screening and checkup Recommended yearly dental visit for hygiene and checkup  Vaccinations: Influenza vaccine up to date Pneumococcal vaccine up to date Tdap vaccine up to date Shingles vaccine up to date    Advanced directives: please complete paperwork and bring back to office so we can place on file  Conditions/risks identified: progressive dementia  Next appointment: not recommended follow up due to age and advanced dementia   Preventive Care 22 Years and Older, Male Preventive care refers to lifestyle choices and visits with your health care provider that can promote health and wellness. What does preventive care include?  A yearly physical exam. This is also called an annual well check.  Dental exams once or twice a year.  Routine eye exams. Ask your health care provider how often you should have your eyes checked.  Personal lifestyle choices, including:  Daily care of your teeth and gums.  Regular physical activity.  Eating a healthy diet.  Avoiding tobacco and drug use.  Limiting alcohol use.  Practicing safe sex.  Taking low doses of aspirin every day.  Taking vitamin and mineral supplements as recommended by your health care provider. What happens during an annual well check? The services and screenings done by your health care provider during your annual well check will depend on your age, overall health, lifestyle risk factors, and family history of disease. Counseling  Your health care provider may ask you questions about your:  Alcohol use.  Tobacco use.  Drug use.  Emotional well-being.   Home and relationship well-being.  Sexual activity.  Eating habits.  History of falls.  Memory and ability to understand (cognition).  Work and work Statistician. Screening  You may have the following tests or measurements:  Height, weight, and BMI.  Blood pressure.  Lipid and cholesterol levels. These may be checked every 5 years, or more frequently if you are over 5 years old.  Skin check.  Lung cancer screening. You may have this screening every year starting at age 57 if you have a 30-pack-year history of smoking and currently smoke or have quit within the past 15 years.  Fecal occult blood test (FOBT) of the stool. You may have this test every year starting at age 23.  Flexible sigmoidoscopy or colonoscopy. You may have a sigmoidoscopy every 5 years or a colonoscopy every 10 years starting at age 51.  Prostate cancer screening. Recommendations will vary depending on your family history and other risks.  Hepatitis C blood test.  Hepatitis B blood test.  Sexually transmitted disease (STD) testing.  Diabetes screening. This is done by checking your blood sugar (glucose) after you have not eaten for a while (fasting). You may have this done every 1-3 years.  Abdominal aortic aneurysm (AAA) screening. You may need this if you are a current or former smoker.  Osteoporosis. You may be screened starting at age 79 if you are at high risk. Talk with your health care provider about your test results, treatment options, and if necessary, the need for more tests. Vaccines  Your health care provider may recommend certain vaccines, such as:  Influenza vaccine. This is recommended every year.  Tetanus, diphtheria,  and acellular pertussis (Tdap, Td) vaccine. You may need a Td booster every 10 years.  Zoster vaccine. You may need this after age 6.  Pneumococcal 13-valent conjugate (PCV13) vaccine. One dose is recommended after age 45.  Pneumococcal polysaccharide (PPSV23)  vaccine. One dose is recommended after age 22. Talk to your health care provider about which screenings and vaccines you need and how often you need them. This information is not intended to replace advice given to you by your health care provider. Make sure you discuss any questions you have with your health care provider. Document Released: 06/25/2015 Document Revised: 02/16/2016 Document Reviewed: 03/30/2015 Elsevier Interactive Patient Education  2017 [employer] Prevention in the Home Falls can cause injuries. They can happen to people of all ages. There are many things you can do to make your home safe and to help prevent falls. What can I do on the outside of my home?  Regularly fix the edges of walkways and driveways and fix any cracks.  Remove anything that might make you trip as you walk through a door, such as a raised step or threshold.  Trim any bushes or trees on the path to your home.  Use bright outdoor lighting.  Clear any walking paths of anything that might make someone trip, such as rocks or tools.  Regularly check to see if handrails are loose or broken. Make sure that both sides of any steps have handrails.  Any raised decks and porches should have guardrails on the edges.  Have any leaves, snow, or ice cleared regularly.  Use sand or salt on walking paths during winter.  Clean up any spills in your garage right away. This includes oil or grease spills. What can I do in the bathroom?  Use night lights.  Install grab bars by the toilet and in the tub and shower. Do not use towel bars as grab bars.  Use non-skid mats or decals in the tub or shower.  If you need to sit down in the shower, use a plastic, non-slip stool.  Keep the floor dry. Clean up any water that spills on the floor as soon as it happens.  Remove soap buildup in the tub or shower regularly.  Attach bath mats securely with double-sided non-slip rug tape.  Do not have throw rugs  and other things on the floor that can make you trip. What can I do in the bedroom?  Use night lights.  Make sure that you have a light by your bed that is easy to reach.  Do not use any sheets or blankets that are too big for your bed. They should not hang down onto the floor.  Have a firm chair that has side arms. You can use this for support while you get dressed.  Do not have throw rugs and other things on the floor that can make you trip. What can I do in the kitchen?  Clean up any spills right away.  Avoid walking on wet floors.  Keep items that you use a lot in easy-to-reach places.  If you need to reach something above you, use a strong step stool that has a grab bar.  Keep electrical cords out of the way.  Do not use floor polish or wax that makes floors slippery. If you must use wax, use non-skid floor wax.  Do not have throw rugs and other things on the floor that can make you trip. What can I do with my  stairs?  Do not leave any items on the stairs.  Make sure that there are handrails on both sides of the stairs and use them. Fix handrails that are broken or loose. Make sure that handrails are as long as the stairways.  Check any carpeting to make sure that it is firmly attached to the stairs. Fix any carpet that is loose or worn.  Avoid having throw rugs at the top or bottom of the stairs. If you do have throw rugs, attach them to the floor with carpet tape.  Make sure that you have a light switch at the top of the stairs and the bottom of the stairs. If you do not have them, ask someone to add them for you. What else can I do to help prevent falls?  Wear shoes that:  Do not have high heels.  Have rubber bottoms.  Are comfortable and fit you well.  Are closed at the toe. Do not wear sandals.  If you use a stepladder:  Make sure that it is fully opened. Do not climb a closed stepladder.  Make sure that both sides of the stepladder are locked into  place.  Ask someone to hold it for you, if possible.  Clearly mark and make sure that you can see:  Any grab bars or handrails.  First and last steps.  Where the edge of each step is.  Use tools that help you move around (mobility aids) if they are needed. These include:  Canes.  Walkers.  Scooters.  Crutches.  Turn on the lights when you go into a dark area. Replace any light bulbs as soon as they burn out.  Set up your furniture so you have a clear path. Avoid moving your furniture around.  If any of your floors are uneven, fix them.  If there are any pets around you, be aware of where they are.  Review your medicines with your doctor. Some medicines can make you feel dizzy. This can increase your chance of falling. Ask your doctor what other things that you can do to help prevent falls. This information is not intended to replace advice given to you by your health care provider. Make sure you discuss any questions you have with your health care provider. Document Released: 03/25/2009 Document Revised: 11/04/2015 Document Reviewed: 07/03/2014 Elsevier Interactive Patient Education  2017 [employer].

## 2019-03-03 NOTE — Progress Notes (Signed)
Subjective:   MAXIMILLAN BAIAMONTE is a 85 y.o. male who presents for Medicare Annual/Subsequent preventive examination.  Review of Systems:   Cardiac Risk Factors include: male gender;advanced age (>64men, >4 women);sedentary lifestyle     Objective:    Vitals: There were no vitals taken for this visit.  There is no height or weight on file to calculate BMI.  Advanced Directives 03/03/2019 02/25/2019 11/28/2018 10/29/2018 10/02/2018 08/01/2018 04/16/2018  Does Patient Have a Medical Advance Directive? Yes No No Yes Yes Yes No  Type of Advance Directive Healthcare Power of [place] -  Does patient want to make changes to medical advance directive? No - Patient declined - - No - Patient declined No - Patient declined No - Patient declined -  Copy of [place] in Chart? Yes - validated most recent copy scanned in chart (See row information) - - Yes - validated most recent copy scanned in chart (See row information) No - copy requested Yes - validated most recent copy scanned in chart (See row information) -    Tobacco Social History   Tobacco Use  Smoking Status Former Smoker  . Packs/day: 0.30  . Years: 10.00  . Pack years: 3.00  . Types: Cigarettes  . Quit date: 06/13/1955  . Years since quitting: 63.7  Smokeless Tobacco Never Used     Counseling given: Not Answered   Clinical Intake:  Pre-visit preparation completed: Yes  Pain : No/denies pain     BMI - recorded: 20.36 Nutritional Status: BMI of 19-24  Normal Nutritional Risks: None Diabetes: No  How often do you need to have someone help you when you read instructions, pamphlets, or other written materials from your doctor or pharmacy?: 5 - Always What is the last grade level you completed in school?: 12th grade and community college  Interpreter Needed?: No     Past Medical History:  Diagnosis Date  . BPH  (benign prostatic hypertrophy)   . Cataract    Both  . Dementia ([place])   . Diverticula of colon   . Elevated lactic acid level   . Gallbladder polyp   . Glaucoma 1986   Dr. Mila Merry  . Gram negative sepsis ([place])   . Hearing loss   . Hx of adenomatous colonic polyps   . Internal hemorrhoids   . Left varicocele   . Liver cyst   . Obesity   . Pseudodementia   . RBBB (right bundle branch block)   . Urine retention   . UTI (urinary tract infection)    Past Surgical History:  Procedure Laterality Date  . CATARACT EXTRACTION Right   . GLAUCOMA REPAIR Left   . KNEE SURGERY    . TRANSURETHRAL RESECTION OF PROSTATE  07/16/2018   [facility]   Family History  Problem Relation Age of Onset  . Heart disease Father 29  . Parkinson's disease Father   . Cancer Mother        ? type  . Colon cancer Neg Hx    Social History   Socioeconomic History  . Marital status: Married    Spouse name: Not on file  . Number of children: 4  . Years of education: Not on file  . Highest education level: Not on file  Occupational History  . Not on file  Social Needs  . Financial resource strain: Not hard at all  . Food  insecurity    Worry: Never true    Inability: Never true  . Transportation needs    Medical: No    Non-medical: No  Tobacco Use  . Smoking status: Former Smoker    Packs/day: 0.30    Years: 10.00    Pack years: 3.00    Types: Cigarettes    Quit date: 06/13/1955    Years since quitting: 63.7  . Smokeless tobacco: Never Used  Substance and Sexual Activity  . Alcohol use: No  . Drug use: No  . Sexual activity: Not Currently  Lifestyle  . Physical activity    Days per week: 5 days    Minutes per session: 20 min  . Stress: Only a little  Relationships  . Social connections    Talks on phone: More than three times a week    Gets together: More than three times a week    Attends religious service: More than 4 times per year    Active member of club or organization:  No    Attends meetings of clubs or organizations: Never    Relationship status: Married  Other Topics Concern  . Not on file  Social History Narrative   Social History      Diet?       Do you drink/eat things with caffeine? Coffee, tea      Marital status?           married                         What year were you married? 1957      Do you live in a house, apartment, assisted living, condo, trailer, etc.? house      Is it one or more stories? one      How many persons live in your home? 2      Do you have any pets in your home? (please list) no      Highest level of education completed? High school      Current or past profession: Camera operator      Do you exercise?           yes                           Type & how often? Walking everyday weather permitting      Do you have a living will? yes      Do you have a DNR form?      no                            If not, do you want to discuss one?      Do you have signed POA/HPOA for forms? yes      Functional Status      Do you have difficulty bathing or dressing yourself? no      Do you have difficulty preparing food or eating? no      Do you have difficulty managing your medications? no      Do you have difficulty managing your finances? yes      Do you have difficulty affording your medications? no    Outpatient Encounter Medications as of 03/03/2019  Medication Sig  . acetaminophen (TYLENOL) 500 MG tablet Take 500 mg by mouth as needed.  Marland Kitchen aspirin  EC 81 MG tablet Take 81 mg by mouth daily.  . brimonidine (ALPHAGAN) 0.2 % ophthalmic solution Place 1 drop into the left eye Twice daily.   . clotrimazole (LOTRIMIN) 1 % cream Apply 1 application topically 2 (two) times daily as needed.  . dorzolamide-timolol (COSOPT) 22.3-6.8 MG/ML ophthalmic solution Place 1 drop into the left eye 2 (two) times daily.  . famotidine (PEPCID) 20 MG tablet Take 20 mg by mouth at bedtime as needed for  heartburn or indigestion.  . ferrous sulfate 325 (65 FE) MG tablet TAKE 1 TABLET BY MOUTH EVERY DAY WITH BREAKFAST  . Multiple Vitamin (MULTIVITAMIN) tablet Take 1 tablet by mouth daily.  Marland Kitchen nystatin (MYCOSTATIN/NYSTOP) powder Apply topically 3 (three) times daily.  . polyethylene glycol (MIRALAX / GLYCOLAX) 17 g packet Take 17 g by mouth daily.  . [DISCONTINUED] docusate sodium (COLACE) 100 MG capsule Take 100 mg by mouth daily as needed for mild constipation.   No facility-administered encounter medications on file as of 03/03/2019.     Activities of Daily Living In your present state of health, do you have any difficulty performing the following activities: 03/03/2019  Hearing? Y  Comment will not wear hearing aides  Vision? Y  Comment hx of glaucoma  Difficulty concentrating or making decisions? Y  Comment daughter and wife helps  Walking or climbing stairs? Y  Comment daughter and wife helps  Dressing or bathing? Y  Comment daughter and wife helps  Doing errands, shopping? Y  Comment daughter and wife helps  Conservation officer, nature and eating ? Y  Comment daughter and wife helps  Using the Toilet? Y  In the past six months, have you accidently leaked urine? N  Comment chronic catheter  Do you have problems with loss of bowel control? N  Managing your Medications? Y  Comment daughter and wife helps  Managing your Finances? Y  Comment daughter and wife helps  Housekeeping or managing your Housekeeping? Y  Comment daughter and wife helps  Some recent data might be hidden    [name], NP as PCP - General (Geriatric Medicine) [name], MD as Consulting Physician (Ophthalmology) [name], MD as Consulting Physician (Neurology) [name], MD as Consulting Physician (Pulmonary Disease)   Assessment:   This is a routine wellness examination for Chael.  Exercise Activities and Dietary recommendations Current Exercise Habits: Home  exercise routine, Type of exercise: walking;strength training/weights, Time (Minutes): 30, Frequency (Times/Week): 7, Weekly Exercise (Minutes/Week): 210, Intensity: Mild  Goals   None     Fall Risk Fall Risk  03/03/2019 01/29/2019 11/28/2018 11/07/2018 10/29/2018  Falls in the past year? 1 0 1 0 0  Number falls in past yr: 0 0 1 0 0  Injury with Fall? 0 0 0 0 0  Risk Factor Category  - - - - -   Is the patient's home free of loose throw rugs in walkways, pet beds, electrical cords, etc?   yes      Grab bars in the bathroom? yes      Handrails on the stairs?   yes      Adequate lighting?   yes  Timed Get Up and Go Performed: na  Depression Screen PHQ 2/9 Scores 03/03/2019 02/28/2018 06/22/2017  PHQ - 2 Score 0 0 0    Cognitive Function MMSE - Mini Mental State Exam 04/02/2018 09/27/2017 03/27/2017 09/19/2016 03/14/2016  Not completed: - - Unable to complete - -  Orientation  to time 1 2 - 4 1  Orientation to Place 4 4 - 4 5  Registration 3 3 - 3 3  Attention/ Calculation 4 3 - 4 4  Recall 0 0 - 0 0  Language- name 2 objects 2 2 - 2 2  Language- repeat 1 1 - 1 1  Language- follow 3 step command 3 3 - 3 3  Language- read & follow direction 1 1 - 1 1  Write a sentence 1 1 - 1 1  Copy design 1 1 - 0 1  Total score 21 21 - 23 22   Montreal Cognitive Assessment  11/28/2018  Attention: Read list of digits (0/2) 2  Attention: Read list of letters (0/1) 1  Attention: Serial 7 subtraction starting at 100 (0/3) 0  Language: Repeat phrase (0/2) 0  Language : Fluency (0/1) 0  Abstraction (0/2) 0  Delayed Recall (0/5) 0  Orientation (0/6) 2   6CIT Screen 03/03/2019  What Year? 4 points  What month? 3 points  What time? 3 points    Immunization History  Administered Date(s) Administered  . Fluad Quad(high Dose 65+) 01/29/2019  . Influenza Split 03/29/2011, 03/13/2012  . Influenza Whole 03/11/2008, 03/26/2009, 02/24/2010  . Influenza, High Dose Seasonal PF 04/02/2013, 03/17/2014,  02/12/2015, 03/16/2016, 02/27/2017, 02/28/2018  . Influenza,inj,Quad PF,6+ Mos 04/02/2013, 03/17/2014, 02/12/2015, 02/27/2017, 02/28/2018  . Pneumococcal Conjugate-13 04/28/2015  . Pneumococcal Polysaccharide-23 09/10/2000  . Tdap 12/26/2011  . Zoster Recombinat (Shingrix) 04/27/2017, 01/29/2019    Qualifies for Shingles Vaccine? completed  Screening Tests Health Maintenance  Topic Date Due  . TETANUS/TDAP  12/25/2021  . INFLUENZA VACCINE  Completed  . PNA vac Low Risk Adult  Completed   Cancer Screenings: Lung: Low Dose CT Chest recommended if Age 66-80 years, 30 pack-year currently smoking OR have quit w/in 15years. Patient does not qualify. Colorectal: aged out  Additional Screenings: Hepatitis C Screening:na      Plan:     I have personally reviewed and noted the following in the patient's chart:   . Medical and social history . Use of alcohol, tobacco or illicit drugs  . Current medications and supplements . Functional ability and status . Nutritional status . Physical activity . Advanced directives . List of other physicians . Hospitalizations, surgeries, and ER visits in previous 12 months . Vitals . Screenings to include cognitive, depression, and falls . Referrals and appointments  In addition, I have reviewed and discussed with patient certain preventive protocols, quality metrics, and best practice recommendations. A written personalized care plan for preventive services as well as general preventive health recommendations were provided to patient.     [name], NP  03/03/2019

## 2019-03-11 ENCOUNTER — Ambulatory Visit (INDEPENDENT_AMBULATORY_CARE_PROVIDER_SITE_OTHER): Payer: Medicare Other

## 2019-03-11 DIAGNOSIS — R55 Syncope and collapse: Secondary | ICD-10-CM | POA: Diagnosis not present

## 2019-03-19 ENCOUNTER — Telehealth: Payer: Self-pay | Admitting: Nurse Practitioner

## 2019-03-19 NOTE — Telephone Encounter (Signed)
He can have nursing for catheter care

## 2019-03-19 NOTE — Telephone Encounter (Signed)
[name] has been comiing in fro approx 2 wks now for PT & [name] mentioned that they would possibly get nursing as well for after dc from hospital.  Could they get that order faxed over to Dave?  Thanks, Lattie Haw

## 2019-03-20 NOTE — Telephone Encounter (Signed)
Verbal orders given to Santiago Glad with [facility] [phone]

## 2019-03-26 ENCOUNTER — Telehealth: Payer: Self-pay

## 2019-03-26 NOTE — Telephone Encounter (Signed)
Katie with [facility] called as a FYI to inform [name], NP that patient/family declined nursing services for catheter care.

## 2019-03-26 NOTE — Telephone Encounter (Signed)
Noted thank you

## 2019-03-27 ENCOUNTER — Telehealth: Payer: Self-pay

## 2019-03-27 NOTE — Telephone Encounter (Signed)
Called Anderson Malta back to give verbal order for the week extension per [name]

## 2019-03-27 NOTE — Telephone Encounter (Signed)
[name] from [facility] called to ask for a verbal order for extended 1 week for therapy for the patient she is waiting for my call back  Please advise

## 2019-03-27 NOTE — Telephone Encounter (Signed)
Yes this is okay 

## 2019-04-07 ENCOUNTER — Telehealth: Payer: Self-pay | Admitting: Emergency Medicine

## 2019-04-07 ENCOUNTER — Telehealth: Payer: Self-pay

## 2019-04-07 NOTE — Telephone Encounter (Signed)
[name] is a Physical therapy assistant with [facility] reporting a missed appointment last week.

## 2019-04-07 NOTE — Telephone Encounter (Signed)
Left message for patient to return call regarding results  

## 2019-06-03 ENCOUNTER — Encounter: Payer: Self-pay | Admitting: Neurology

## 2019-06-04 ENCOUNTER — Encounter: Payer: Self-pay | Admitting: Nurse Practitioner

## 2019-06-04 ENCOUNTER — Ambulatory Visit (INDEPENDENT_AMBULATORY_CARE_PROVIDER_SITE_OTHER): Payer: Medicare Other | Admitting: Nurse Practitioner

## 2019-06-04 ENCOUNTER — Other Ambulatory Visit: Payer: Self-pay

## 2019-06-04 DIAGNOSIS — R339 Retention of urine, unspecified: Secondary | ICD-10-CM

## 2019-06-04 DIAGNOSIS — Z7409 Other reduced mobility: Secondary | ICD-10-CM

## 2019-06-04 DIAGNOSIS — D649 Anemia, unspecified: Secondary | ICD-10-CM

## 2019-06-04 DIAGNOSIS — F0390 Unspecified dementia without behavioral disturbance: Secondary | ICD-10-CM

## 2019-06-04 DIAGNOSIS — K7689 Other specified diseases of liver: Secondary | ICD-10-CM

## 2019-06-04 DIAGNOSIS — J302 Other seasonal allergic rhinitis: Secondary | ICD-10-CM

## 2019-06-04 DIAGNOSIS — N1832 Chronic kidney disease, stage 3b: Secondary | ICD-10-CM

## 2019-06-04 DIAGNOSIS — K5903 Drug induced constipation: Secondary | ICD-10-CM

## 2019-06-04 NOTE — Progress Notes (Signed)
This service is provided via telemedicine  No vital signs collected/recorded due to the encounter was a telemedicine visit.   Location of patient (ex: home, work):  Home  Patient consents to a telephone visit:  Yes  Location of the provider (ex: office, home):  Office  Name of any referring provider: N/A  Names of all persons participating in the telemedicine service and their role in the encounter:  Sherrie Mustache, NP, Ruthell Rummage CMA, and patient   Time spent on call:  Ruthell Rummage CMA spent 8 minutes on phone with patient      Careteam: [name], NP as PCP - General (Geriatric Medicine) [name], MD as Consulting Physician (Ophthalmology) [name], MD as Consulting Physician (Neurology) [name], MD as Consulting Physician (Pulmonary Disease)  Advanced Directive information Does Patient Have a Medical Advance Directive?: Yes, Type of Advance Directive: [place], Does patient want to make changes to medical advance directive?: No - Patient declined  No Known Allergies  Chief Complaint  Patient presents with  . Medical Management of Chronic Issues    3 month follow up , wife would like to request something for allergies (over the counter) patient has been congested and sneezing      HPI: Patient is a 85 y.o. male seen via virtual visit for 3 month follow up. Unable to connect virtually so telephone visit was done.   Pt with hx of advanced dementia, BPH with indwelling catheter and bundle brand block, GERD, iron def anemia, constipation.  Wife reports things are going well. A few ED visit due to UTI with catheter.  Nov 15th and Dec 9th. He was check out thoroughly, given antibiotic and sent home.  Ongoing follow up with urologist for catheter change, changing every 20-25 days.    Gaining strength, appetite is good.   He has nasal congestion and coughing and sneezing due to allergies.    Dementia- following with neurologist due to progressive dementia.   Anemia- continues on iron.   Constipated related to iron- controlled on miralax and prune juice.  GERD- not needing pepcid, controlled.   Oa knee- no further issues or pain.   Review of Systems: provided by wife. Review of Systems  Constitutional: Negative for chills, fever and weight loss.  HENT: Positive for congestion. Negative for tinnitus.        Sneezing  Respiratory: Positive for cough (dry). Negative for sputum production and shortness of breath.   Cardiovascular: Negative for chest pain, palpitations and leg swelling.  Gastrointestinal: Negative for abdominal pain, constipation, diarrhea and heartburn.  Genitourinary: Negative for dysuria, frequency and urgency.       Chronic indwelling catheter   Musculoskeletal: Negative for back pain, falls, joint pain and myalgias.  Skin: Negative.   Neurological: Negative for dizziness and headaches.  Endo/Heme/Allergies: Positive for environmental allergies.  Psychiatric/Behavioral: Positive for memory loss. Negative for depression. The patient is not nervous/anxious and does not have insomnia.     Past Medical History:  Diagnosis Date  . BPH (benign prostatic hypertrophy)   . Cataract    Both  . Dementia ([place])   . Diverticula of colon   . Elevated lactic acid level   . Gallbladder polyp   . Glaucoma 1986   Dr. Mila Merry  . Gram negative sepsis ([place])   . Hearing loss   . Hx of adenomatous colonic polyps   . Internal hemorrhoids   . Left varicocele   .  Liver cyst   . Obesity   . Pseudodementia   . RBBB (right bundle branch block)   . Urine retention   . UTI (urinary tract infection)    Past Surgical History:  Procedure Laterality Date  . CATARACT EXTRACTION Right   . GLAUCOMA REPAIR Left   . KNEE SURGERY    . TRANSURETHRAL RESECTION OF PROSTATE  07/16/2018   [facility]   Social History:   reports that he quit smoking about 64 years ago.  His smoking use included cigarettes. He has a 3.00 pack-year smoking history. He has never used smokeless tobacco. He reports that he does not drink alcohol or use drugs.  Family History  Problem Relation Age of Onset  . Heart disease Father 47  . Parkinson's disease Father   . Cancer Mother        ? type  . Colon cancer Neg Hx     Medications: Patient's Medications  New Prescriptions   No medications on file  Previous Medications   ACETAMINOPHEN (TYLENOL) 500 MG TABLET    Take 500 mg by mouth as needed.   ASPIRIN EC 81 MG TABLET    Take 81 mg by mouth daily.   BRIMONIDINE (ALPHAGAN) 0.2 % OPHTHALMIC SOLUTION    Place 1 drop into the left eye Twice daily.    CLOTRIMAZOLE (LOTRIMIN) 1 % CREAM    Apply 1 application topically 2 (two) times daily as needed.   DORZOLAMIDE-TIMOLOL (COSOPT) 22.3-6.8 MG/ML OPHTHALMIC SOLUTION    Place 1 drop into the left eye 2 (two) times daily.   FAMOTIDINE (PEPCID) 20 MG TABLET    Take 20 mg by mouth at bedtime as needed for heartburn or indigestion.   FERROUS SULFATE 325 (65 FE) MG TABLET    TAKE 1 TABLET BY MOUTH EVERY DAY WITH BREAKFAST   MULTIPLE VITAMIN (MULTIVITAMIN) TABLET    Take 1 tablet by mouth daily.   NYSTATIN (MYCOSTATIN/NYSTOP) POWDER    Apply topically 3 (three) times daily.   POLYETHYLENE GLYCOL (MIRALAX / GLYCOLAX) 17 G PACKET    Take 17 g by mouth daily.  Modified Medications   No medications on file  Discontinued Medications   No medications on file    Physical Exam:  There were no vitals filed for this visit. There is no height or weight on file to calculate BMI. Wt Readings from Last 3 Encounters:  02/27/19 130 lb (59 kg)  02/25/19 131 lb 3.2 oz (59.5 kg)  01/29/19 129 lb (58.5 kg)      Labs reviewed: Basic Metabolic Panel: Recent Labs    11/07/18 1136 01/29/19 1121 02/25/19 1558  NA 139 139 139  K 4.1 4.3 4.2  CL 103 104 102  CO2 27 29 28   GLUCOSE 91 83 83  BUN 15 18 19   CREATININE 1.53* 1.16* 1.26*   CALCIUM 9.6 9.8 9.6   Liver Function Tests: Recent Labs    10/24/18 1010 01/29/19 1121  AST 20 21  ALT 13 12  BILITOT 0.5 0.5  PROT 7.7 7.3   No results for input(s): LIPASE, AMYLASE in the last 8760 hours. No results for input(s): AMMONIA in the last 8760 hours. CBC: Recent Labs    10/24/18 1010 01/29/19 1121 02/25/19 1558  WBC 6.8 4.6 4.5  NEUTROABS 4,529 1,707 2,034  HGB 11.0* 12.9* 13.4  HCT 32.8* 38.7 39.7  MCV 88.6 92.6 92.1  PLT 354 155 201   Lipid Panel: No results for input(s): CHOL, HDL, LDLCALC, TRIG,  CHOLHDL, LDLDIRECT in the last 8760 hours. TSH: No results for input(s): TSH in the last 8760 hours. A1C: No results found for: HGBA1C   Assessment/Plan 1. Other reduced mobility Improving, continues with PT  2. Urinary retention Ongoing foley catheter, going to urologist routinely for change. Educated regarding routine catheter care and keeping bag below the level of the bladder.   3. Liver cyst -stable, followed by GI for monitoring.   4. Dementia without behavioral disturbance, unspecified dementia type ([place]) Doing well without acute changes, when mental status changes wife aware this is likely due to UTI. Has follow up scheduled with neurologist.   5. Stage 3b chronic kidney disease Stable on recent labs. Encourage proper hydration and to avoid NSAIDS (Aleve, Advil, Motrin, Ibuprofen)   6. Anemia Has been stable. Iron tablets very constipation.  Can decrease to three times weekly and we will follow hgb.   7. Constipation Related to iron supplement. Continues on miralax as needed   8. Allergic rhinitis Can use zyrtec 10 mg by mouth daily as needed  Next appt: 4 months for routine follow up  [place]. Malakhi Markwood, [facility] [phone]     Virtual Visit via Telephone Note  I connected with pt and wife on 06/04/19 at  9:00 AM EST by telephone and verified that I am speaking with the correct person using  two identifiers.  Location: Patient: home Provider: office   I discussed the limitations, risks, security and privacy concerns of performing an evaluation and management service by telephone and the availability of in person appointments. I also discussed with the patient that there may be a patient responsible charge related to this service. The patient expressed understanding and agreed to proceed.   I discussed the assessment and treatment plan with the patient. The patient was provided an opportunity to ask questions and all were answered. The patient agreed with the plan and demonstrated an understanding of the instructions.   The patient was advised to call back or seek an in-person evaluation if the symptoms worsen or if the condition fails to improve as anticipated.  I provided 16 minutes of non-face-to-face time during this encounter.  [name]. [name] Av[employer] printed and mailed

## 2019-06-05 ENCOUNTER — Telehealth (INDEPENDENT_AMBULATORY_CARE_PROVIDER_SITE_OTHER): Payer: Medicare Other | Admitting: Neurology

## 2019-06-05 ENCOUNTER — Other Ambulatory Visit: Payer: Self-pay

## 2019-06-05 VITALS — Ht 67.0 in | Wt 129.0 lb

## 2019-06-05 DIAGNOSIS — F0390 Unspecified dementia without behavioral disturbance: Secondary | ICD-10-CM

## 2019-06-05 DIAGNOSIS — F03B0 Unspecified dementia, moderate, without behavioral disturbance, psychotic disturbance, mood disturbance, and anxiety: Secondary | ICD-10-CM

## 2019-06-05 NOTE — Progress Notes (Signed)
Virtual Visit via Video Note The purpose of this virtual visit is to provide medical care while limiting exposure to the novel coronavirus.    Consent was obtained for video visit:  Yes.   Answered questions that patient had about telehealth interaction:  Yes.   I discussed the limitations, risks, security and privacy concerns of performing an evaluation and management service by telemedicine. I also discussed with the patient that there may be a patient responsible charge related to this service. The patient expressed understanding and agreed to proceed.  Pt location: Home Physician Location: office Name of referring provider:  [name], NP I connected with [name] at patients initiation/request on 06/05/2019 at 11:30 AM EST by video enabled telemedicine application and verified that I am speaking with the correct person using two identifiers. Pt MRN:  [id number] Pt DOB:  Sep 04, 1925 Video Participants:  [name];  [name] (wife)   History of Present Illness: The patient was seen as a virtual video visit on 06/05/2019. He was last seen 6 months ago for moderate dementia without behavioral disturbance. His wife is present during the e-visit to provide information. MOCA blind in June 2020 was 6/22, however he had recently been discharged from the hospital at that time. Since his last visit, he has been to the ER a few times for UTI where he would have decreased responsiveness. He would usually come to when EMS arrives. His last ER visit was 3 weeks ago. He becomes more confused and lethargic with the recurrent UTIs. He is in good spirits today, his wife reports he has been recovering well. His legs are getting stronger, he can ambulate with family standing beside him. Appetite and sleep are good. His wife states he does not have a lot of hallucinations, he likes to relive his past. He gets frustrated at times when he cannot do things he used to do. He denies any  headaches, dizziness, no falls. They will be celebrating 79 years of marriage next month.    Current Outpatient Medications on File Prior to Visit  Medication Sig Dispense Refill  . acetaminophen (TYLENOL) 500 MG tablet Take 500 mg by mouth as needed.    Marland Kitchen aspirin EC 81 MG tablet Take 81 mg by mouth daily.    . brimonidine (ALPHAGAN) 0.2 % ophthalmic solution Place 1 drop into the left eye Twice daily.     . clotrimazole (LOTRIMIN) 1 % cream Apply 1 application topically 2 (two) times daily as needed.    . dorzolamide-timolol (COSOPT) 22.3-6.8 MG/ML ophthalmic solution Place 1 drop into the left eye 2 (two) times daily.    . famotidine (PEPCID) 20 MG tablet Take 20 mg by mouth at bedtime as needed for heartburn or indigestion.    . ferrous sulfate 325 (65 FE) MG tablet TAKE 1 TABLET BY MOUTH EVERY DAY WITH BREAKFAST 90 tablet 1  . Multiple Vitamin (MULTIVITAMIN) tablet Take 1 tablet by mouth daily.    Marland Kitchen nystatin (MYCOSTATIN/NYSTOP) powder Apply topically 3 (three) times daily. 15 g 2  . polyethylene glycol (MIRALAX / GLYCOLAX) 17 g packet Take 17 g by mouth daily.     No current facility-administered medications on file prior to visit.    Observations/Objective:   GEN:  The patient appears stated age and is in NAD.  Neurological examination: Patient is awake, alert, oriented to person, place. No aphasia or dysarthria. Intact fluency and comprehension. Remote and recent memory impaired. Able to name, difficulty with  repetition.  MMSE - Mini Mental State Exam 06/03/2019 04/02/2018 09/27/2017  Not completed: - - -  Orientation to time 0 1 2  Orientation to Place 4 4 4   Registration 3 3 3   Attention/ Calculation 0 4 3  Recall 0 0 0  Language- name 2 objects 2 2 2   Language- repeat 0 1 1  Language- follow 3 step command 3 3 3   Language- read & follow direction 0 1 1  Write a sentence 0 1 1  Copy design 0 1 1  Total score 12 21 21    Cranial nerves: Extraocular movements intact with no  nystagmus. No facial asymmetry. Motor: moves all extremities symmetrically, at least anti-gravity x 4. No incoordination on finger to nose testing. Gait: slow and cautious, no ataxia.  Assessment and Plan:   This is a very pleasant 85 yo RH man with no significant past medical history, with moderate vascular dementia without behavioral disturbance. MMSE today 12/30. He has recurrent UTIs where he would have transient worsening mental status, continue supportive care. Continue trying to mobilize him with assistance. We again discussed and agreed that side effects/risks of dementia medication at this point outweigh potential benefits. Continue 24/7 care. He will follow-up in 6 months or earlier if needed.    Follow Up Instructions:   -I discussed the assessment and treatment plan with the patient/wife. The patient/wife were provided an opportunity to ask questions and all were answered. The patient/wife agreed with the plan and demonstrated an understanding of the instructions.   The patient/wife were advised to call back or seek an in-person evaluation if the symptoms worsen or if the condition fails to improve as anticipated.    [name], MD

## 2019-06-27 ENCOUNTER — Telehealth: Payer: Self-pay | Admitting: *Deleted

## 2019-06-27 NOTE — Telephone Encounter (Signed)
Daughter, [name] dropped off FMLA Paperwork to be filled out for [employer].  Employer Contact:  Gretel Acre (Sr. Benefits Consultant) Fax: [phone] or [phone] #: [phone] Placed paperwork in [place] folder to review, fill out and sign. To call daughter once ready for pickup [phone]

## 2019-07-04 DIAGNOSIS — Z029 Encounter for administrative examinations, unspecified: Secondary | ICD-10-CM

## 2019-07-09 ENCOUNTER — Telehealth: Payer: Self-pay

## 2019-07-09 NOTE — Telephone Encounter (Signed)
Patient wife "[name]" called and states that she's not sure if her husband should receive the vaccination because of his medical history. Patient want's to know your opinion. Patient was given phone number for Covid waiting list as well. Please Advise.

## 2019-07-09 NOTE — Telephone Encounter (Signed)
Patient wife states that he has Kidney issues,  Liver issues,  Heart issues, Dementia, and Bladder Retention. She wants to know if you think the benefit of the vaccine will out weigh the risk? Please Advise.

## 2019-07-09 NOTE — Telephone Encounter (Signed)
Can you clarify what exactly in his medical history she can concerns about?

## 2019-07-09 NOTE — Telephone Encounter (Signed)
I told patient that I would give them a call in the morning with the answer.

## 2019-07-10 NOTE — Telephone Encounter (Signed)
Yes I would recommend the vaccine. Getting the virus would be much worse then the side effects from the vaccine.

## 2019-07-10 NOTE — Telephone Encounter (Signed)
Discussed response with patients wife and she verbalized understanding. Mrs.Nappo was very appreciative to [place] attention to her concerns

## 2019-07-30 ENCOUNTER — Encounter: Payer: Self-pay | Admitting: Nurse Practitioner

## 2019-07-30 ENCOUNTER — Other Ambulatory Visit: Payer: Self-pay

## 2019-07-30 ENCOUNTER — Ambulatory Visit (INDEPENDENT_AMBULATORY_CARE_PROVIDER_SITE_OTHER): Payer: Medicare Other | Admitting: Nurse Practitioner

## 2019-07-30 VITALS — BP 122/74 | HR 56 | Temp 96.4°F | Ht 67.0 in | Wt 130.0 lb

## 2019-07-30 DIAGNOSIS — R339 Retention of urine, unspecified: Secondary | ICD-10-CM

## 2019-07-30 DIAGNOSIS — F0390 Unspecified dementia without behavioral disturbance: Secondary | ICD-10-CM

## 2019-07-30 DIAGNOSIS — J309 Allergic rhinitis, unspecified: Secondary | ICD-10-CM

## 2019-07-30 DIAGNOSIS — R42 Dizziness and giddiness: Secondary | ICD-10-CM | POA: Diagnosis not present

## 2019-07-30 DIAGNOSIS — D649 Anemia, unspecified: Secondary | ICD-10-CM

## 2019-07-30 DIAGNOSIS — N1832 Chronic kidney disease, stage 3b: Secondary | ICD-10-CM

## 2019-07-30 DIAGNOSIS — R001 Bradycardia, unspecified: Secondary | ICD-10-CM

## 2019-07-30 NOTE — Progress Notes (Signed)
Careteam: [name], NP as PCP - General (Geriatric Medicine) [name], MD as Consulting Physician (Ophthalmology) [name], MD as Consulting Physician (Neurology) [name], MD as Consulting Physician (Pulmonary Disease)  Advanced Directive information    No Known Allergies  Chief Complaint  Patient presents with  . Acute Visit    Dizziness in the am x 1.5 weeks   . Skin Problem    Examine sore area on back      HPI: Patient is a 86 y.o. male for dizziness. Pt with dementia. Wife reports when he wakes up and sits up he complaints of being dizzy. He is more unsteady when they go to the bathroom. After he is up for a little while he is okay and no other issues.  Wife reports she can not get him to drink enough fluids.  Question UTI and going today to get his catheter changed and looked at his urine by urology. Also questioned blood pressure- normally this is okay at home Eating good for dinner.   Pt had a syncopal episode in September, monitor revealed HR ranging from 49-150 with episodes of SVT.   Wife reports they have gotten first COVID vaccine and scheduled to get second.  Review of Systems:  Review of Systems  Constitutional: Negative for chills, fever, malaise/fatigue and weight loss.  HENT: Positive for hearing loss. Negative for congestion, sore throat and tinnitus.   Eyes: Positive for blurred vision (chronic).  Respiratory: Negative for cough, sputum production and shortness of breath.   Cardiovascular: Negative for chest pain, palpitations and leg swelling.  Gastrointestinal: Negative for abdominal pain, constipation, diarrhea and heartburn.  Genitourinary: Negative for dysuria, frequency and urgency.  Musculoskeletal: Negative for back pain, falls, joint pain and myalgias.  Skin: Negative.   Neurological: Positive for dizziness (in the am only). Negative for headaches.  Endo/Heme/Allergies: Positive for  environmental allergies.  Psychiatric/Behavioral: Positive for memory loss.    Past Medical History:  Diagnosis Date  . BPH (benign prostatic hypertrophy)   . Cataract    Both  . Dementia ([place])   . Diverticula of colon   . Elevated lactic acid level   . Gallbladder polyp   . Glaucoma 1986   Dr. Mila Merry  . Gram negative sepsis ([place])   . Hearing loss   . Hx of adenomatous colonic polyps   . Internal hemorrhoids   . Left varicocele   . Liver cyst   . Obesity   . Pseudodementia   . RBBB (right bundle branch block)   . Urine retention   . UTI (urinary tract infection)    Past Surgical History:  Procedure Laterality Date  . CATARACT EXTRACTION Right   . GLAUCOMA REPAIR Left   . KNEE SURGERY    . TRANSURETHRAL RESECTION OF PROSTATE  07/16/2018   [facility]   Social History:   reports that he quit smoking about 64 years ago. His smoking use included cigarettes. He has a 3.00 pack-year smoking history. He has never used smokeless tobacco. He reports that he does not drink alcohol or use drugs.  Family History  Problem Relation Age of Onset  . Heart disease Father 6  . Parkinson's disease Father   . Cancer Mother        ? type  . Colon cancer Neg Hx     Medications: Patient's Medications  New Prescriptions   No medications on file  Previous Medications  ACETAMINOPHEN (TYLENOL) 500 MG TABLET    Take 500 mg by mouth as needed.   ASPIRIN EC 81 MG TABLET    Take 81 mg by mouth daily.   BRIMONIDINE (ALPHAGAN) 0.2 % OPHTHALMIC SOLUTION    Place 1 drop into the left eye Twice daily.    CLOTRIMAZOLE (LOTRIMIN) 1 % CREAM    Apply 1 application topically 2 (two) times daily as needed.   DORZOLAMIDE-TIMOLOL (COSOPT) 22.3-6.8 MG/ML OPHTHALMIC SOLUTION    Place 1 drop into the left eye 2 (two) times daily.   FAMOTIDINE (PEPCID) 20 MG TABLET    Take 20 mg by mouth at bedtime as needed for heartburn or indigestion.   FERROUS SULFATE 325 (65 FE) MG TABLET    TAKE 1 TABLET BY  MOUTH EVERY DAY WITH BREAKFAST   MULTIPLE VITAMIN (MULTIVITAMIN) TABLET    Take 1 tablet by mouth daily.   NYSTATIN (MYCOSTATIN/NYSTOP) POWDER    Apply topically 3 (three) times daily.   POLYETHYLENE GLYCOL (MIRALAX / GLYCOLAX) 17 G PACKET    Take 17 g by mouth daily.  Modified Medications   No medications on file  Discontinued Medications   No medications on file    Physical Exam:  Vitals:   07/30/19 1107  BP: (!) 142/80  Pulse: 60  Temp: (!) 96.4 F (35.8 C)  TempSrc: Temporal  SpO2: 97%  Weight: 130 lb (59 kg)  Height: '5\' 7"'  (1.702 m)   Body mass index is 20.36 kg/m. Wt Readings from Last 3 Encounters:  07/30/19 130 lb (59 kg)  06/03/19 129 lb (58.5 kg)  02/27/19 130 lb (59 kg)    Physical Exam Constitutional:      General: He is not in acute distress.    Appearance: He is well-developed. He is not diaphoretic.  HENT:     Head: Normocephalic and atraumatic.     Mouth/Throat:     Pharynx: No oropharyngeal exudate.  Eyes:     Comments: Left eye clouding/blind  Cardiovascular:     Rate and Rhythm: Regular rhythm. Bradycardia present.     Heart sounds: Normal heart sounds.  Pulmonary:     Effort: Pulmonary effort is normal.     Breath sounds: Normal breath sounds.  Abdominal:     General: Bowel sounds are normal.     Palpations: Abdomen is soft.  Musculoskeletal:        General: No tenderness.     Cervical back: Normal range of motion and neck supple.  Skin:    General: Skin is warm and dry.  Neurological:     General: No focal deficit present.     Mental Status: He is alert. Mental status is at baseline.     Gait: Gait abnormal (slowed).  Psychiatric:        Mood and Affect: Mood normal.     Labs reviewed: Basic Metabolic Panel: Recent Labs    11/07/18 1136 01/29/19 1121 02/25/19 1558  NA 139 139 139  K 4.1 4.3 4.2  CL 103 104 102  CO2 '27 29 28  ' GLUCOSE 91 83 83  BUN '15 18 19  ' CREATININE 1.53* 1.16* 1.26*  CALCIUM 9.6 9.8 9.6   Liver  Function Tests: Recent Labs    10/24/18 1010 01/29/19 1121  AST 20 21  ALT 13 12  BILITOT 0.5 0.5  PROT 7.7 7.3   No results for input(s): LIPASE, AMYLASE in the last 8760 hours. No results for input(s): AMMONIA in the last 8760 hours.  CBC: Recent Labs    10/24/18 1010 01/29/19 1121 02/25/19 1558  WBC 6.8 4.6 4.5  NEUTROABS 4,529 1,707 2,034  HGB 11.0* 12.9* 13.4  HCT 32.8* 38.7 39.7  MCV 88.6 92.6 92.1  PLT 354 155 201   Lipid Panel: No results for input(s): CHOL, HDL, LDLCALC, TRIG, CHOLHDL, LDLDIRECT in the last 8760 hours. TSH: No results for input(s): TSH in the last 8760 hours. A1C: No results found for: HGBA1C   Assessment/Plan 1. Dementia without behavioral disturbance, unspecified dementia type ([place]) Advanced dementia. Wife is primary caregiver. Expect further decline with disease progression.   2. Dizziness -likely multifactoral but pt and wife reporting only symptoms first thing in the morning when getting out of bed. Blood pressure today in office does drop when going from sitting to standing. He has bradycardia. Wife reports he drinks minimal fluids. Also with nasal congestion/drainage.  -encouraged zyrtec or Claritin daily -plans to follow up with cardiologist next week for routine visit. Pt with hx of bradycardia. Encouraged to push fluids and change positions from laying to standing very slow.  - CBC with Differential/Platelet - TSH - CMP with eGFR([facility])  3. Allergic rhinitis, unspecified seasonality, unspecified trigger -having more sinus drainage, can start OTC claritin or zyrtec 10 mg daily, can contribute to dizziness.  4. Urinary retention Chronic foley cath, follow up with urologist this afternoon.  5. Stage 3b chronic kidney disease -continue to encourage proper hydration, avoid NSAIDs and dose adjust medication as needed  - CMP with eGFR([facility])  6. Anemia, unspecified type -continues on iron supplement, will follow up CBC  7.  Bradycardia -noted today, pts HR ranging from 50s-70s in epic. He had syncopal episode last year and wore monitor which revealed HR ranging from 49-150. Wife reports he is doing fine except first thing in the morning and states she will discuss HR further with cardiologist. This could be contributing to dizziness.   Next appt: 4 weeks to discuss advanced directives and MOST form [facility] Lorenzo K. Calvin, [facility] [phone]

## 2019-07-30 NOTE — Patient Instructions (Addendum)
To start Claritin or zyrtec 10 mg by mouth daily- can use generic- for allergies which may be contributing to dizziness  Have him get out of bed SLOWLY Sit up in bed first for a few mins, then sit at the edge of the bed for a few minutes then slowly get up to standing  To attempt to increase fluid intake.   To give eye drops once he is out of the bed   Would recommend completing advance care planning paperwork.

## 2019-08-01 ENCOUNTER — Telehealth: Payer: Self-pay

## 2019-08-01 NOTE — Telephone Encounter (Signed)
Discussed results with patients wife, Mrs.Montemurro verbalized understanding of results.  Medication list updated , iron supplement removed.   I double checked with Hassan Rowan ([place] lab tech) about adding on additional labs. Per Hassan Rowan labs have not been added for they were not written on the add on sheet and she assumed the paper with lab results that Rodena Piety (CMA) left on her desk with the added on high lighted was left accidentally by the patient or someone. (refer to labs to see Anita's comment)  In order for Hassan Rowan to process an add-on she needs the [facility] test code and the diagnosis in numeric form.   I wrote the add on request on the add on log to be further processed.

## 2019-08-01 NOTE — Telephone Encounter (Signed)
-----  Message from [name], NP sent at 07/31/2019 11:09 AM EST ----- Blood counts stable, he can stop iron as hgb has be9en stable. No signs of infection. Electrolytes, Thyroid, kidney function stable. Alk phos is mildly elevated but trending up. This could be liver or bone related, his other liver enzymes are normal. Can we add on ggt and isoenzymes?

## 2019-08-04 LAB — COMPLETE METABOLIC PANEL WITH GFR
AG Ratio: 1.2 (calc) (ref 1.0–2.5)
ALT: 14 U/L (ref 9–46)
AST: 23 U/L (ref 10–35)
Albumin: 4.3 g/dL (ref 3.6–5.1)
Alkaline phosphatase (APISO): 174 U/L — ABNORMAL HIGH (ref 35–144)
BUN/Creatinine Ratio: 15 (calc) (ref 6–22)
BUN: 20 mg/dL (ref 7–25)
CO2: 28 mmol/L (ref 20–32)
Calcium: 10.3 mg/dL (ref 8.6–10.3)
Chloride: 101 mmol/L (ref 98–110)
Creat: 1.35 mg/dL — ABNORMAL HIGH (ref 0.70–1.11)
GFR, Est African American: 53 mL/min/{1.73_m2} — ABNORMAL LOW (ref >=60)
GFR, Est Non African American: 46 mL/min/{1.73_m2} — ABNORMAL LOW (ref >=60)
Globulin: 3.6 g/dL (calc) (ref 1.9–3.7)
Glucose, Bld: 83 mg/dL (ref 65–99)
Potassium: 4.6 mmol/L (ref 3.5–5.3)
Sodium: 139 mmol/L (ref 135–146)
Total Bilirubin: 0.6 mg/dL (ref 0.2–1.2)
Total Protein: 7.9 g/dL (ref 6.1–8.1)

## 2019-08-04 LAB — TEST AUTHORIZATION 2

## 2019-08-04 LAB — CBC WITH DIFFERENTIAL/PLATELET
Absolute Monocytes: 357 cells/uL (ref 200–950)
Basophils Absolute: 21 cells/uL (ref 0–200)
Basophils Relative: 0.5 %
Eosinophils Absolute: 151 cells/uL (ref 15–500)
Eosinophils Relative: 3.6 %
HCT: 44.5 % (ref 38.5–50.0)
Hemoglobin: 15 g/dL (ref 13.2–17.1)
Lymphs Abs: 1961 cells/uL (ref 850–3900)
MCH: 31.6 pg (ref 27.0–33.0)
MCHC: 33.7 g/dL (ref 32.0–36.0)
MCV: 93.7 fL (ref 80.0–100.0)
MPV: 10.4 fL (ref 7.5–12.5)
Monocytes Relative: 8.5 %
Neutro Abs: 1709 cells/uL (ref 1500–7800)
Neutrophils Relative %: 40.7 %
Platelets: 227 10*3/uL (ref 140–400)
RBC: 4.75 10*6/uL (ref 4.20–5.80)
RDW: 11.8 % (ref 11.0–15.0)
Total Lymphocyte: 46.7 %
WBC: 4.2 10*3/uL (ref 3.8–10.8)

## 2019-08-04 LAB — TSH: TSH: 3.86 mIU/L (ref 0.40–4.50)

## 2019-08-04 LAB — GAMMA GT: GGT: 28 U/L (ref 3–70)

## 2019-08-07 ENCOUNTER — Other Ambulatory Visit: Payer: Medicare Other

## 2019-08-07 ENCOUNTER — Ambulatory Visit: Payer: Medicare Other | Admitting: Cardiology

## 2019-08-07 ENCOUNTER — Other Ambulatory Visit: Payer: Self-pay

## 2019-08-07 ENCOUNTER — Encounter: Payer: Self-pay | Admitting: Cardiology

## 2019-08-07 VITALS — BP 122/72 | HR 72 | Ht 67.0 in | Wt 130.8 lb

## 2019-08-07 DIAGNOSIS — I4510 Unspecified right bundle-branch block: Secondary | ICD-10-CM | POA: Diagnosis not present

## 2019-08-07 DIAGNOSIS — F0390 Unspecified dementia without behavioral disturbance: Secondary | ICD-10-CM

## 2019-08-07 DIAGNOSIS — E781 Pure hyperglyceridemia: Secondary | ICD-10-CM

## 2019-08-07 DIAGNOSIS — N1831 Chronic kidney disease, stage 3a: Secondary | ICD-10-CM

## 2019-08-07 DIAGNOSIS — R748 Abnormal levels of other serum enzymes: Secondary | ICD-10-CM

## 2019-08-07 NOTE — Progress Notes (Signed)
Cardiology Office Note:    Date:  08/07/2019   ID:  [name], DOB Oct 18, 1925, MRN [id number]  PCP:  [name], NP  Cardiologist:  Jenne Campus, MD    Referring MD: [name], NP   No chief complaint on file. Doing fair  History of Present Illness:    [name] is a 86 y.o. male with complex past medical history.  The reason why I see him is the fact that he does have bifascicular block.  Luckily, no episodes of syncope.  Lately he is being deteriorating from his health point of view.  He sits in the chair all the time.  He does have dementia.  His wife is with him in the room and she is taking excellent care of him.  She describes episode of dizziness.  He said sometimes in the morning when he gets up his wife help him he will get dizzy then they go to the restroom and she helped him to wash himself as well as brush his teeth and then he usually feels better.  No passing out.  Denies having any shortness of breath or chest tightness.  Past Medical History:  Diagnosis Date  . BPH (benign prostatic hypertrophy)   . Cataract    Both  . Dementia ([place])   . Diverticula of colon   . Elevated lactic acid level   . Gallbladder polyp   . Glaucoma 1986   Dr. Mila Merry  . Gram negative sepsis ([place])   . Hearing loss   . Hx of adenomatous colonic polyps   . Internal hemorrhoids   . Left varicocele   . Liver cyst   . Obesity   . Pseudodementia   . RBBB (right bundle branch block)   . Urine retention   . UTI (urinary tract infection)     Past Surgical History:  Procedure Laterality Date  . CATARACT EXTRACTION Right   . GLAUCOMA REPAIR Left   . KNEE SURGERY    . TRANSURETHRAL RESECTION OF PROSTATE  07/16/2018   [facility]    Current Medications: Current Meds  Medication Sig  . acetaminophen (TYLENOL) 500 MG tablet Take 500 mg by mouth as needed.  Marland Kitchen aspirin EC 81 MG tablet Take 81 mg by mouth daily.  . brimonidine (ALPHAGAN) 0.2 %  ophthalmic solution Place 1 drop into the left eye Twice daily.   . clotrimazole (LOTRIMIN) 1 % cream Apply 1 application topically 2 (two) times daily as needed.  . dorzolamide-timolol (COSOPT) 22.3-6.8 MG/ML ophthalmic solution Place 1 drop into the left eye 2 (two) times daily.  . famotidine (PEPCID) 20 MG tablet Take 20 mg by mouth at bedtime as needed for heartburn or indigestion.  . Multiple Vitamin (MULTIVITAMIN) tablet Take 1 tablet by mouth daily.  Marland Kitchen nystatin (MYCOSTATIN/NYSTOP) powder Apply topically 3 (three) times daily.  . polyethylene glycol (MIRALAX / GLYCOLAX) 17 g packet Take 17 g by mouth daily.     Allergies:   Patient has no known allergies.   Social History   Socioeconomic History  . Marital status: Married    Spouse name: Not on file  . Number of children: 4  . Years of education: Not on file  . Highest education level: Not on file  Occupational History  . Not on file  Tobacco Use  . Smoking status: Former Smoker    Packs/day: 0.30    Years: 10.00    Pack years: 3.00    Types:  Cigarettes    Quit date: 06/13/1955    Years since quitting: 64.1  . Smokeless tobacco: Never Used  Substance and Sexual Activity  . Alcohol use: No  . Drug use: No  . Sexual activity: Not Currently  Other Topics Concern  . Not on file  Social History Narrative   Social History      Diet?       Do you drink/eat things with caffeine? Coffee, tea      Marital status?           married                         What year were you married? 1957      Do you live in a house, apartment, assisted living, condo, trailer, etc.? house      Is it one or more stories? one      How many persons live in your home? 2      Do you have any pets in your home? (please list) no      Highest level of education completed? High school      Current or past profession: Camera operator      Do you exercise?           yes                           Type & how often?  Walking everyday weather permitting      Do you have a living will? yes      Do you have a DNR form?      no                            If not, do you want to discuss one?      Do you have signed POA/HPOA for forms? yes      Functional Status      Do you have difficulty bathing or dressing yourself? no      Do you have difficulty preparing food or eating? no      Do you have difficulty managing your medications? no      Do you have difficulty managing your finances? yes      Do you have difficulty affording your medications? no   Social Determinants of Health   Financial Resource Strain:   . Difficulty of Paying Living Expenses: Not on file  Food Insecurity:   . Worried About Charity fundraiser in the Last Year: Not on file  . Ran Out of Food in the Last Year: Not on file  Transportation Needs:   . Lack of Transportation (Medical): Not on file  . Lack of Transportation (Non-Medical): Not on file  Physical Activity:   . Days of Exercise per Week: Not on file  . Minutes of Exercise per Session: Not on file  Stress:   . Feeling of Stress : Not on file  Social Connections:   . Frequency of Communication with Friends and Family: Not on file  . Frequency of Social Gatherings with Friends and Family: Not on file  . Attends Religious Services: Not on file  . Active Member of Clubs or Organizations: Not on file  . Attends Archivist Meetings: Not on file  . Marital Status: Not on file  Family History: The patient's family history includes Cancer in his mother; Heart disease (age of onset: 60) in his father; Parkinson's disease in his father. There is no history of Colon cancer. ROS:   Please see the history of present illness.    All 14 point review of systems negative except as described per history of present illness  EKGs/Labs/Other Studies Reviewed:      Recent Labs: 07/30/2019: ALT 14; BUN 20; Creat 1.35; Hemoglobin 15.0; Platelets 227; Potassium 4.6;  Sodium 139; TSH 3.86  Recent Lipid Panel    Component Value Date/Time   CHOL 186 03/16/2017 0924   TRIG 49 03/16/2017 0924   TRIG 61 05/11/2006 1035   HDL 76 03/16/2017 0924   CHOLHDL 2.4 03/16/2017 0924   VLDL 12.6 09/15/2015 1359   LDLCALC 96 03/16/2017 0924   LDLDIRECT 106.9 12/26/2011 0946    Physical Exam:    VS:  BP 122/72   Pulse 72   Ht 5\' 7"  (1.702 m)   Wt 130 lb 12.8 oz (59.3 kg)   SpO2 96%   BMI 20.49 kg/m     Wt Readings from Last 3 Encounters:  08/07/19 130 lb 12.8 oz (59.3 kg)  07/30/19 130 lb (59 kg)  06/03/19 129 lb (58.5 kg)     GEN:  Well nourished, well developed in no acute distress HEENT: Normal NECK: No JVD; No carotid bruits LYMPHATICS: No lymphadenopathy CARDIAC: RRR, no murmurs, no rubs, no gallops RESPIRATORY:  Clear to auscultation without rales, wheezing or rhonchi  ABDOMEN: Soft, non-tender, non-distended MUSCULOSKELETAL:  No edema; No deformity  SKIN: Warm and dry LOWER EXTREMITIES: no swelling NEUROLOGIC:  Alert and oriented x 3 PSYCHIATRIC:  Normal affect   ASSESSMENT:    1. BUNDLE BRANCH BLOCK, RIGHT   2. 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 Pure hypertriglyceridemia    PLAN:    In order of problems listed above:  1. Bundle branch block.  I asked him to have EKG today.  And then we will schedule him to wear Zio patch for 1 week.  I spoke also to his wife and asked direct questioning if there will be some indication for pacemaker if they would be willing to accept that offer and she said yes. 2. Dementia stable however progressing. 3. Dyslipidemia.  We will continue conservative approach.  With his age there is no clear-cut benefits of statin.  Otherwise he seems to be fine.  Again there is some deterioration of his condition overall which is related to age.  He does have recurrent urine tract infections.  He does have a Foley catheter.   Medication Adjustments/Labs and Tests Ordered: Current medicines  are reviewed at length with the patient today.  Concerns regarding medicines are outlined above.  No orders of the defined types were placed in this encounter.  Medication changes: No orders of the defined types were placed in this encounter.   Signed, Park Liter, MD, [facility] 08/07/2019 12:00 PM    [place]

## 2019-08-07 NOTE — Addendum Note (Signed)
Addended by: Ashok Norris on: 08/07/2019 12:18 PM   Modules accepted: Orders

## 2019-08-07 NOTE — Patient Instructions (Signed)
Medication Instructions:  Your physician recommends that you continue on your current medications as directed. Please refer to the Current Medication list given to you today.  *If you need a refill on your cardiac medications before your next appointment, please call your pharmacy*  Lab Work: None.  If you have labs (blood work) drawn today and your tests are completely normal, you will receive your results only by: Marland Kitchen MyChart Message (if you have MyChart) OR . A paper copy in the mail If you have any lab test that is abnormal or we need to change your treatment, we will call you to review the results.  Testing/Procedures: A zio monitor was ordered today. It will remain on for 7 days. You will then return monitor and event diary in provided box. It takes 1-2 weeks for report to be downloaded and returned to [place]. We will call you with the results. If monitor falls off or has orange flashing light, please call Zio for further instructions.     Follow-Up: At [facility], you and your health needs are our priority.  As part of our continuing mission to provide you with exceptional heart care, we have created designated Provider Care Teams.  These Care Teams include your primary Cardiologist (physician) and Advanced Practice Providers (APPs -  Physician Assistants and Nurse Practitioners) who all work together to provide you with the care you need, when you need it.  Your next appointment:   5 month(s)  The format for your next appointment:   In Person  Provider:   Jenne Campus, MD  Other Instructions

## 2019-08-12 LAB — ALKALINE PHOSPHATASE ISOENZYMES
Alkaline phosphatase (APISO): 175 U/L — ABNORMAL HIGH (ref 35–144)
Bone Isoenzymes: 57 % (ref 28–66)
Intestinal Isoenzymes: 15 % (ref 1–24)
Liver Isoenzymes: 28 % (ref 25–69)

## 2019-08-13 ENCOUNTER — Ambulatory Visit (INDEPENDENT_AMBULATORY_CARE_PROVIDER_SITE_OTHER): Payer: Medicare Other

## 2019-08-13 DIAGNOSIS — I4510 Unspecified right bundle-branch block: Secondary | ICD-10-CM | POA: Diagnosis not present

## 2019-08-19 ENCOUNTER — Telehealth (INDEPENDENT_AMBULATORY_CARE_PROVIDER_SITE_OTHER): Payer: Medicare Other | Admitting: Nurse Practitioner

## 2019-08-19 ENCOUNTER — Encounter: Payer: Self-pay | Admitting: Nurse Practitioner

## 2019-08-19 ENCOUNTER — Other Ambulatory Visit: Payer: Self-pay

## 2019-08-19 ENCOUNTER — Telehealth: Payer: Self-pay

## 2019-08-19 DIAGNOSIS — Z7189 Other specified counseling: Secondary | ICD-10-CM

## 2019-08-19 NOTE — Telephone Encounter (Signed)
Patient was called to start appointment. Patient didn't answer and voicemail was left with office call back number. Patient was made aware in voicemail that 3rd attempt will result to appointment rescheduling.

## 2019-08-19 NOTE — Progress Notes (Signed)
This service is provided via telemedicine  No vital signs collected/recorded due to the encounter was a telemedicine visit.   Location of patient (ex: home, work): Home  Patient consents to a telephone visit:  Yes  Location of the provider (ex: office, home):  [place].  Name of any referring provider:  N/A  Names of all persons participating in the telemedicine service and their role in the encounter: Patient, Wife [name], [name], RMA, Sherrie Mustache, NP.    Time spent on call:  8 minutes on the phone with Medical Assistant.      Careteam: [name], NP as PCP - General (Geriatric Medicine) [name], MD as Consulting Physician (Ophthalmology) [name], MD as Consulting Physician (Neurology) [name], MD as Consulting Physician (Pulmonary Disease)  Advanced Directive information Does Patient Have a Medical Advance Directive?: Yes, Type of Advance Directive: [place], Does patient want to make changes to medical advance directive?: No - Patient declined  No Known Allergies  Chief Complaint  Patient presents with  . Medical Management of Chronic Issues    4 week follow up     HPI: Patient is a 86 y.o. male to go over MOST form and complete. Pt with hx of dementia, bifascicular block followed by cardiology, BPH with chronic foley and recurrent UTI.  Questions over DNR vs no DNR.  They want to do something but not sure if they really wants chest compressions.  Would like everything done up until the point of no pulse and no breathing. Do not want invasive airway but wants everything done up until this point.  Would not want a feeding tube but agreeable to IV  Review of Systems:  Review of Systems  Constitutional: Negative for chills, fever, malaise/fatigue and weight loss.    Past Medical History:  Diagnosis Date  . BPH (benign prostatic hypertrophy)   . Cataract      Both  . Dementia ([place])   . Diverticula of colon   . Elevated lactic acid level   . Gallbladder polyp   . Glaucoma 1986   Dr. Mila Merry  . Gram negative sepsis ([place])   . Hearing loss   . Hx of adenomatous colonic polyps   . Internal hemorrhoids   . Left varicocele   . Liver cyst   . Obesity   . Pseudodementia   . RBBB (right bundle branch block)   . Urine retention   . UTI (urinary tract infection)    Past Surgical History:  Procedure Laterality Date  . CATARACT EXTRACTION Right   . GLAUCOMA REPAIR Left   . KNEE SURGERY    . TRANSURETHRAL RESECTION OF PROSTATE  07/16/2018   [facility]   Social History:   reports that he quit smoking about 64 years ago. His smoking use included cigarettes. He has a 3.00 pack-year smoking history. He has never used smokeless tobacco. He reports that he does not drink alcohol or use drugs.  Family History  Problem Relation Age of Onset  . Heart disease Father 57  . Parkinson's disease Father   . Cancer Mother        ? type  . Colon cancer Neg Hx     Medications: Patient's Medications  New Prescriptions   No medications on file  Previous Medications   ACETAMINOPHEN (TYLENOL) 500 MG TABLET    Take 500 mg by mouth as needed.   ASPIRIN EC  81 MG TABLET    Take 81 mg by mouth daily.   BRIMONIDINE (ALPHAGAN) 0.2 % OPHTHALMIC SOLUTION    Place 1 drop into the left eye Twice daily.    CLOTRIMAZOLE (LOTRIMIN) 1 % CREAM    Apply 1 application topically 2 (two) times daily as needed.   DORZOLAMIDE-TIMOLOL (COSOPT) 22.3-6.8 MG/ML OPHTHALMIC SOLUTION    Place 1 drop into the left eye 2 (two) times daily.   FAMOTIDINE (PEPCID) 20 MG TABLET    Take 20 mg by mouth at bedtime as needed for heartburn or indigestion.   MULTIPLE VITAMIN (MULTIVITAMIN) TABLET    Take 1 tablet by mouth daily.   NYSTATIN (MYCOSTATIN/NYSTOP) POWDER    Apply topically 3 (three) times daily.   POLYETHYLENE GLYCOL (MIRALAX / GLYCOLAX) 17 G PACKET    Take 17 g by mouth  daily.   SACCHAROMYCES BOULARDII (FLORASTOR BABY) 250 MG PACK    Take 250 mg by mouth daily.  Modified Medications   No medications on file  Discontinued Medications   No medications on file    Physical Exam:  There were no vitals filed for this visit. There is no height or weight on file to calculate BMI. Wt Readings from Last 3 Encounters:  08/07/19 130 lb 12.8 oz (59.3 kg)  07/30/19 130 lb (59 kg)  06/03/19 129 lb (58.5 kg)      Labs reviewed: Basic Metabolic Panel: Recent Labs    01/29/19 1121 02/25/19 1558 07/30/19 1151  NA 139 139 139  K 4.3 4.2 4.6  CL 104 102 101  CO2 29 28 28   GLUCOSE 83 83 83  BUN 18 19 20   CREATININE 1.16* 1.26* 1.35*  CALCIUM 9.8 9.6 10.3  TSH  --   --  3.86   Liver Function Tests: Recent Labs    10/24/18 1010 01/29/19 1121 07/30/19 1151  AST 20 21 23   ALT 13 12 14   BILITOT 0.5 0.5 0.6  PROT 7.7 7.3 7.9   No results for input(s): LIPASE, AMYLASE in the last 8760 hours. No results for input(s): AMMONIA in the last 8760 hours. CBC: Recent Labs    01/29/19 1121 02/25/19 1558 07/30/19 1151  WBC 4.6 4.5 4.2  NEUTROABS 1,707 2,034 1,709  HGB 12.9* 13.4 15.0  HCT 38.7 39.7 44.5  MCV 92.6 92.1 93.7  PLT 155 201 227   Lipid Panel: No results for input(s): CHOL, HDL, LDLCALC, TRIG, CHOLHDL, LDLDIRECT in the last 8760 hours. TSH: Recent Labs    07/30/19 1151  TSH 3.86   A1C: No results found for: HGBA1C   Assessment/Plan 1. Advance care planning -reviewed and completed MOST form with pt, pts wife and daughter via video visit. Pt who has dementia requested that his wife and daughter ultimately make the decisions on his behalf. Each section was reviewed and questions were answered. Family and pt do not wish to have invasive airway or compression but would like aggressive treatment up until that point. - DNR (Do Not Resuscitate) signed and will be mailed to patient and wife.  -MOST form on file and copy will be mailed.    Next appt: 10/03/2019 as scheduled.  [name]. [name]  [facility] [phone]   Virtual Visit via Video Note  I connected with [name] on 08/19/19 at  2:15 PM EST by a video enabled telemedicine application and verified that I am speaking with the correct person using two identifiers.  Location: Patient: home Provider:  [place]   I discussed the limitations of evaluation and management by telemedicine and the availability of in person appointments. The patient expressed understanding and agreed to proceed.    I discussed the assessment and treatment plan with the patient. The patient was provided an opportunity to ask questions and all were answered. The patient agreed with the plan and demonstrated an understanding of the instructions.   The patient was advised to call back or seek an in-person evaluation if the symptoms worsen or if the condition fails to improve as anticipated.  I provided 25 minutes of non-face-to-face time during this encounter.  [name]. Dewaine Oats, AGNP Avs printed and mailed.

## 2019-08-21 ENCOUNTER — Telehealth: Payer: Self-pay | Admitting: *Deleted

## 2019-08-21 NOTE — Telephone Encounter (Signed)
Received fax from [employer] for this pt stating there was not enough data on monitor to provide report for this patient. The pt will not be charged by [employer] for this service and will likely need to be re patched.  

## 2019-09-03 NOTE — Telephone Encounter (Signed)
Per Dr. Agustin Cree no repeat monitor needed at this time.

## 2019-09-17 NOTE — Progress Notes (Signed)
Patient wife informed of results per dpr.  

## 2019-10-01 ENCOUNTER — Ambulatory Visit (INDEPENDENT_AMBULATORY_CARE_PROVIDER_SITE_OTHER): Payer: Medicare Other | Admitting: Nurse Practitioner

## 2019-10-01 ENCOUNTER — Other Ambulatory Visit: Payer: Self-pay

## 2019-10-01 ENCOUNTER — Encounter: Payer: Self-pay | Admitting: Nurse Practitioner

## 2019-10-01 VITALS — BP 120/70 | HR 70 | Temp 97.7°F | Resp 16 | Ht 67.0 in | Wt 133.0 lb

## 2019-10-01 DIAGNOSIS — F0390 Unspecified dementia without behavioral disturbance: Secondary | ICD-10-CM | POA: Diagnosis not present

## 2019-10-01 DIAGNOSIS — N1832 Chronic kidney disease, stage 3b: Secondary | ICD-10-CM | POA: Diagnosis not present

## 2019-10-01 DIAGNOSIS — R42 Dizziness and giddiness: Secondary | ICD-10-CM

## 2019-10-01 DIAGNOSIS — R339 Retention of urine, unspecified: Secondary | ICD-10-CM

## 2019-10-01 DIAGNOSIS — Z7409 Other reduced mobility: Secondary | ICD-10-CM

## 2019-10-01 DIAGNOSIS — L299 Pruritus, unspecified: Secondary | ICD-10-CM

## 2019-10-01 NOTE — Progress Notes (Signed)
Careteam: [name], NP as PCP - General (Geriatric Medicine) [name], MD as Consulting Physician (Ophthalmology) [name], MD as Consulting Physician (Neurology) [name], MD as Consulting Physician (Pulmonary Disease)  PLACE OF SERVICE:  [place] Directive information Does Patient Have a Medical Advance Directive?: Yes, Type of Advance Directive: [place];Out of facility DNR (pink MOST or yellow form), Does patient want to make changes to medical advance directive?: No - Patient declined  No Known Allergies  Chief Complaint  Patient presents with  . Follow-up    4 Month Follow Up.     HPI: Patient is a 86 y.o. male for routine follow up.   doing well.  Complains of itching on back and between legs. Reports he thinks he has a sore but wife reports area is closed and there is some areas of irritation . Right BBB/Bradycardia- had monitor, did not show any significant arrhythmias no other workup.   Occasional dizziness in the morning, but resolves has he gets up. Wife gives him water and boost first thing in the morning which has helped.   Neurogenic bladder- ongoing follow up with urology, catheter change every 3 weeks.   Dementia- progressive, hallucinations are getting somewhat worse. Feds himself., appetite is good.  Needs help bathing and dressing.  Ongoing weakness.  Review of Systems:  Review of Systems  Unable to perform ROS: Dementia    Past Medical History:  Diagnosis Date  . BPH (benign prostatic hypertrophy)   . Cataract    Both  . Dementia ([place])   . Diverticula of colon   . Elevated lactic acid level   . Gallbladder polyp   . Glaucoma 1986   Dr. Mila Merry  . Gram negative sepsis ([place])   . Hearing loss   . Hx of adenomatous colonic polyps   . Internal hemorrhoids   . Left varicocele   . Liver cyst   . Obesity   . Pseudodementia   . RBBB (right bundle branch  block)   . Urine retention   . UTI (urinary tract infection)    Past Surgical History:  Procedure Laterality Date  . CATARACT EXTRACTION Right   . GLAUCOMA REPAIR Left   . KNEE SURGERY    . TRANSURETHRAL RESECTION OF PROSTATE  07/16/2018   [facility]   Social History:   reports that he quit smoking about 64 years ago. His smoking use included cigarettes. He has a 3.00 pack-year smoking history. He has never used smokeless tobacco. He reports that he does not drink alcohol or use drugs.  Family History  Problem Relation Age of Onset  . Heart disease Father 65  . Parkinson's disease Father   . Cancer Mother        ? type  . Colon cancer Neg Hx     Medications: Patient's Medications  New Prescriptions   No medications on file  Previous Medications   ACETAMINOPHEN (TYLENOL) 500 MG TABLET    Take 500 mg by mouth as needed.   ASPIRIN EC 81 MG TABLET    Take 81 mg by mouth daily.   BRIMONIDINE (ALPHAGAN) 0.2 % OPHTHALMIC SOLUTION    Place 1 drop into the left eye Twice daily.    CLOTRIMAZOLE (LOTRIMIN) 1 % CREAM    Apply 1 application topically 2 (two) times daily as needed.   DORZOLAMIDE-TIMOLOL (COSOPT) 22.3-6.8 MG/ML OPHTHALMIC SOLUTION    Place 1 drop  into the left eye 2 (two) times daily.   FAMOTIDINE (PEPCID) 20 MG TABLET    Take 20 mg by mouth at bedtime as needed for heartburn or indigestion.   MULTIPLE VITAMIN (MULTIVITAMIN) TABLET    Take 1 tablet by mouth daily.   NYSTATIN (MYCOSTATIN/NYSTOP) POWDER    Apply topically 3 (three) times daily.   POLYETHYLENE GLYCOL (MIRALAX / GLYCOLAX) 17 G PACKET    Take 17 g by mouth daily.   SACCHAROMYCES BOULARDII (FLORASTOR BABY) 250 MG PACK    Take 250 mg by mouth daily.  Modified Medications   No medications on file  Discontinued Medications   No medications on file    Physical Exam:  Vitals:   10/01/19 1130  BP: 120/70  Pulse: 70  Resp: 16  Temp: 97.7 F (36.5 C)  SpO2: 96%  Weight: 133 lb (60.3 kg)  Height: 5\' 7"   (1.702 m)   Body mass index is 20.83 kg/m. Wt Readings from Last 3 Encounters:  10/01/19 133 lb (60.3 kg)  08/07/19 130 lb 12.8 oz (59.3 kg)  07/30/19 130 lb (59 kg)    Physical Exam Constitutional:      General: He is not in acute distress.    Appearance: He is well-developed. He is not diaphoretic.  HENT:     Head: Normocephalic and atraumatic.  Eyes:     Conjunctiva/sclera: Conjunctivae normal.  Cardiovascular:     Rate and Rhythm: Normal rate and regular rhythm.     Heart sounds: Normal heart sounds.  Pulmonary:     Effort: Pulmonary effort is normal.     Breath sounds: Normal breath sounds.  Abdominal:     General: Bowel sounds are normal.     Palpations: Abdomen is soft.  Musculoskeletal:        General: No tenderness.     Cervical back: Normal range of motion and neck supple.  Skin:    General: Skin is warm and dry.  Neurological:     Mental Status: He is alert. Mental status is at baseline.  Psychiatric:        Mood and Affect: Mood normal.     Labs reviewed: Basic Metabolic Panel: Recent Labs    01/29/19 1121 02/25/19 1558 07/30/19 1151  NA 139 139 139  K 4.3 4.2 4.6  CL 104 102 101  CO2 29 28 28   GLUCOSE 83 83 83  BUN 18 19 20   CREATININE 1.16* 1.26* 1.35*  CALCIUM 9.8 9.6 10.3  TSH  --   --  3.86   Liver Function Tests: Recent Labs    10/24/18 1010 01/29/19 1121 07/30/19 1151  AST 20 21 23   ALT 13 12 14   BILITOT 0.5 0.5 0.6  PROT 7.7 7.3 7.9   No results for input(s): LIPASE, AMYLASE in the last 8760 hours. No results for input(s): AMMONIA in the last 8760 hours. CBC: Recent Labs    01/29/19 1121 02/25/19 1558 07/30/19 1151  WBC 4.6 4.5 4.2  NEUTROABS 1,707 2,034 1,709  HGB 12.9* 13.4 15.0  HCT 38.7 39.7 44.5  MCV 92.6 92.1 93.7  PLT 155 201 227   Lipid Panel: No results for input(s): CHOL, HDL, LDLCALC, TRIG, CHOLHDL, LDLDIRECT in the last 8760 hours. TSH: Recent Labs    07/30/19 1151  TSH 3.86   A1C: No results  found for: HGBA1C   Assessment/Plan 1. Dementia without behavioral disturbance, unspecified dementia type ([place]) Ongoing with slow progressive decline but doing quite well with everything that  he has been through. Great family/home support.   2. Dizziness -ongoing but wife with modifications to daily routine to help which has been good. Continue lifestyle modifications at this time.  3. Urinary retention Due to BPH. Continues with chronic foley, going to urologist every 3 weeks for change  4. Stage 3b chronic kidney disease -Encourage proper hydration and to avoid NSAIDS (Aleve, Advil, Motrin, Ibuprofen)   5. Itching -reports ongoing itching to back, no rash, sores or irritation noted. Encouraged to use cream- such as cerave  after bath  6. Other reduced mobility Using his wife to help support him with mobility. They have a walker at home and encouraged wife to promote the use of the walker vs holding on to her for safety.   Next appt: 4 months.  [name]. [place], [facility] [phone]

## 2019-10-03 ENCOUNTER — Ambulatory Visit: Payer: Medicare Other | Admitting: Nurse Practitioner

## 2019-11-14 ENCOUNTER — Ambulatory Visit (INDEPENDENT_AMBULATORY_CARE_PROVIDER_SITE_OTHER): Payer: Medicare Other | Admitting: Family

## 2019-11-14 ENCOUNTER — Other Ambulatory Visit: Payer: Self-pay

## 2019-11-14 ENCOUNTER — Encounter: Payer: Self-pay | Admitting: Family

## 2019-11-14 VITALS — BP 116/62 | HR 62 | Temp 98.7°F | Ht 67.0 in | Wt 139.9 lb

## 2019-11-14 DIAGNOSIS — T83511S Infection and inflammatory reaction due to indwelling urethral catheter, sequela: Secondary | ICD-10-CM

## 2019-11-14 DIAGNOSIS — D649 Anemia, unspecified: Secondary | ICD-10-CM | POA: Diagnosis not present

## 2019-11-14 DIAGNOSIS — N390 Urinary tract infection, site not specified: Secondary | ICD-10-CM

## 2019-11-14 DIAGNOSIS — J302 Other seasonal allergic rhinitis: Secondary | ICD-10-CM

## 2019-11-14 DIAGNOSIS — E8809 Other disorders of plasma-protein metabolism, not elsewhere classified: Secondary | ICD-10-CM

## 2019-11-14 MED ORDER — LORATADINE 10 MG PO TABS: 10.0000 mg | ORAL_TABLET | Freq: Every day | ORAL | 11 refills | Status: DC | PRN

## 2019-11-14 NOTE — Patient Instructions (Signed)
-   continue to encourage water intake  - continue foley catheter care to prevent urinary tract infection - Take Loratadine 10 mg tablet one by mouth daily as needed for allergies

## 2019-11-14 NOTE — Progress Notes (Signed)
Provider: Dinah Ngetich FNP-C  [name], NP  [name], NP as PCP - General (Geriatric Medicine) [name], MD as Consulting Physician (Ophthalmology) [name], MD as Consulting Physician (Neurology) [name], MD as Consulting Physician (Pulmonary Disease)  Extended Emergency Contact Information Primary Emergency Contact: Melito,[name] Address: [address]          [place], [place] [address] [place] Phone: [phone] Mobile Phone: [phone] Relation: Spouse Secondary Emergency Contact: Vanetta Shawl Address: [address] [place], [place] [address] [place] Work Phone: [phone] Mobile Phone: [phone] Relation: Daughter  Code Status:  DNR Goals of care: Advanced Directive information Advanced Directives 11/14/2019  Does Patient Have a Medical Advance Directive? Yes  Type of Advance Directive Out of facility DNR (pink MOST or yellow form)  Does patient want to make changes to medical advance directive? No - Patient declined  Copy of [place] in Chart? -     Chief Complaint  Patient presents with  . Acute Visit    Follow up on Hospital Visit, Bradycardia, mental status change and UTI    HPI:  Pt is a 86 y.o. male seen today for an acute visit for follow up hospital visit on 11/09/2019 for bradycardia and AMS.He had hypotension treated with IVF by EMS en-route to the hospital with much improvement.Daughter states patient usually has AMS whenever he has a UTI.Urine analysis showed cloudy urine with large leukocytes,positive nitrites 3+ protein,> 182 WBC,rare bacteria,WBC clumps and rare urine mucus.He was treated with I.V. ceftriaxone 1 Gm He was send home on Cefpodoxime 100 mg tablet twice daily x 7 days.On Probiotics.Has indwelling foley catheter.   Past Medical History:  Diagnosis Date  . BPH (benign prostatic hypertrophy)   .  Cataract    Both  . Dementia ([place])   . Diverticula of colon   . Elevated lactic acid level   . Gallbladder polyp   . Glaucoma 1986   Dr. Mila Merry  . Gram negative sepsis ([place])   . Hearing loss   . Hx of adenomatous colonic polyps   . Internal hemorrhoids   . Left varicocele   . Liver cyst   . Obesity   . Pseudodementia   . RBBB (right bundle branch block)   . Urine retention   . UTI (urinary tract infection)    Past Surgical History:  Procedure Laterality Date  . CATARACT EXTRACTION Right   . GLAUCOMA REPAIR Left   . KNEE SURGERY    . TRANSURETHRAL RESECTION OF PROSTATE  07/16/2018   [facility]    No Known Allergies  Outpatient Encounter Medications as of 11/14/2019  Medication Sig  . acetaminophen (TYLENOL) 500 MG tablet Take 500 mg by mouth as needed.  Marland Kitchen aspirin EC 81 MG tablet Take 81 mg by mouth daily.  . brimonidine (ALPHAGAN) 0.2 % ophthalmic solution Place 1 drop into the left eye Twice daily.   . cefpodoxime (VANTIN) 100 MG tablet Take 100 mg by mouth 2 (two) times daily.  . clotrimazole (LOTRIMIN) 1 % cream Apply 1 application topically 2 (two) times daily as needed.  . dorzolamide-timolol (COSOPT) 22.3-6.8 MG/ML ophthalmic solution Place 1 drop into the left eye 2 (two) times daily.  . famotidine (PEPCID) 20 MG tablet Take 20 mg by mouth at bedtime as needed for heartburn or indigestion.  . Multiple Vitamin (MULTIVITAMIN)  tablet Take 1 tablet by mouth daily.  Marland Kitchen nystatin (MYCOSTATIN/NYSTOP) powder Apply topically 3 (three) times daily.  . polyethylene glycol (MIRALAX / GLYCOLAX) 17 g packet Take 17 g by mouth daily.  . Saccharomyces boulardii (FLORASTOR BABY) 250 MG PACK Take 250 mg by mouth daily.   No facility-administered encounter medications on file as of 11/14/2019.    Review of Systems  Unable to perform ROS: Dementia (additional information provided by patient's wife and dauhgter )  Constitutional: Negative for appetite change, chills, fatigue and  fever.  HENT: Negative for congestion.        Clear runny nose sometimes  Respiratory: Negative for cough, chest tightness, shortness of breath and wheezing.   Cardiovascular: Negative for chest pain, palpitations and leg swelling.  Gastrointestinal: Negative for abdominal distention, abdominal pain, constipation, diarrhea, nausea and vomiting.  Genitourinary:       Indwelling foley catheter   Skin: Negative for color change, pallor and rash.  Neurological: Negative for dizziness, speech difficulty, weakness, light-headedness and headaches.  Psychiatric/Behavioral: Negative for agitation, confusion and sleep disturbance. The patient is not nervous/anxious.     Immunization History  Administered Date(s) Administered  . Fluad Quad(high Dose 65+) 01/29/2019  . Influenza Split 03/29/2011, 03/13/2012  . Influenza Whole 03/11/2008, 03/26/2009, 02/24/2010  . Influenza, High Dose Seasonal PF 04/02/2013, 03/17/2014, 02/12/2015, 03/16/2016, 02/27/2017, 02/28/2018  . Influenza,inj,Quad PF,6+ Mos 04/02/2013, 03/17/2014, 02/12/2015, 02/27/2017, 02/28/2018  . PFIZER SARS-COV-2 Vaccination 07/19/2019, 08/09/2019  . Pneumococcal Conjugate-13 04/28/2015  . Pneumococcal Polysaccharide-23 09/10/2000  . Tdap 12/26/2011  . Zoster Recombinat (Shingrix) 04/27/2017, 01/29/2019   Pertinent  Health Maintenance Due  Topic Date Due  . INFLUENZA VACCINE  01/11/2020  . PNA vac Low Risk Adult  Completed   Fall Risk  11/14/2019 10/01/2019 08/19/2019 07/30/2019 06/04/2019  Falls in the past year? 0 0 0 0 0  Number falls in past yr: 0 0 0 0 0  Injury with Fall? 0 0 0 0 0  Risk Factor Category  - - - - -  Risk for fall due to : - - - - -  Follow up - - - - -    Vitals:   11/14/19 1254  BP: 116/62  Pulse: 62  Temp: 98.7 F (37.1 C)  TempSrc: Temporal  SpO2: 97%  Weight: 139 lb 14.4 oz (63.5 kg)  Height: '5\' 7"'  (1.702 m)   Body mass index is 21.91 kg/m. Physical Exam Constitutional:      General: He is  not in acute distress.    Appearance: He is normal weight. He is not ill-appearing.  HENT:     Head: Normocephalic.     Right Ear: Tympanic membrane, ear canal and external ear normal. There is no impacted cerumen.     Left Ear: Tympanic membrane, ear canal and external ear normal. There is no impacted cerumen.     Nose: Nose normal. No congestion or rhinorrhea.     Mouth/Throat:     Mouth: Mucous membranes are moist.     Pharynx: Oropharynx is clear. No oropharyngeal exudate or posterior oropharyngeal erythema.  Eyes:     General: No scleral icterus.       Right eye: No discharge.        Left eye: No discharge.     Extraocular Movements: Extraocular movements intact.     Conjunctiva/sclera: Conjunctivae normal.     Pupils: Pupils are equal, round, and reactive to light.  Neck:     Vascular: No carotid bruit.  Cardiovascular:     Rate and Rhythm: Normal rate and regular rhythm.     Pulses: Normal pulses.     Heart sounds: Normal heart sounds. No murmur. No friction rub. No gallop.   Pulmonary:     Effort: Pulmonary effort is normal. No respiratory distress.     Breath sounds: Normal breath sounds. No wheezing, rhonchi or rales.  Chest:     Chest wall: No tenderness.  Abdominal:     General: Bowel sounds are normal. There is no distension.     Palpations: Abdomen is soft. There is no mass.     Tenderness: There is no abdominal tenderness. There is no right CVA tenderness, left CVA tenderness, guarding or rebound.  Genitourinary:    Comments: Foley catheter in place draining yellow clear urine to leg bag. Musculoskeletal:        General: No swelling or tenderness.     Cervical back: Normal range of motion. No rigidity or tenderness.     Right lower leg: No edema.     Left lower leg: No edema.  Lymphadenopathy:     Cervical: No cervical adenopathy.  Skin:    General: Skin is warm.     Coloration: Skin is not jaundiced or pale.     Findings: No bruising or erythema.   Neurological:     Mental Status: He is alert. Mental status is at baseline.     Cranial Nerves: No cranial nerve deficit.     Sensory: No sensory deficit.     Motor: No weakness.     Coordination: Coordination normal.     Gait: Gait abnormal.  Psychiatric:        Mood and Affect: Mood normal.        Behavior: Behavior normal.        Thought Content: Thought content normal.        Judgment: Judgment normal.     Labs reviewed: Recent Labs    01/29/19 1121 02/25/19 1558 07/30/19 1151  NA 139 139 139  K 4.3 4.2 4.6  CL 104 102 101  CO2 '29 28 28  ' GLUCOSE 83 83 83  BUN '18 19 20  ' CREATININE 1.16* 1.26* 1.35*  CALCIUM 9.8 9.6 10.3   Recent Labs    01/29/19 1121 07/30/19 1151  AST 21 23  ALT 12 14  BILITOT 0.5 0.6  PROT 7.3 7.9   Recent Labs    01/29/19 1121 02/25/19 1558 07/30/19 1151  WBC 4.6 4.5 4.2  NEUTROABS 1,707 2,034 1,709  HGB 12.9* 13.4 15.0  HCT 38.7 39.7 44.5  MCV 92.6 92.1 93.7  PLT 155 201 227   Lab Results  Component Value Date   TSH 3.86 07/30/2019   No results found for: HGBA1C Lab Results  Component Value Date   CHOL 186 03/16/2017   HDL 76 03/16/2017   LDLCALC 96 03/16/2017   LDLDIRECT 106.9 12/26/2011   TRIG 49 03/16/2017   CHOLHDL 2.4 03/16/2017    Significant Diagnostic Results in last 30 days:  No results found.  Assessment/Plan 1. Urinary tract infection associated with indwelling urethral catheter, sequela - continue on cefpodoxime 100 mg tablet twice daily x 7 days along with probiotics.  - CBC with Differential/Platelet - BMP with eGFR([facility])  2. Seasonal allergic rhinitis, unspecified trigger Advised to take loratadine as needed. - loratadine (CLARITIN) 10 MG tablet; Take 1 tablet (10 mg total) by mouth daily as needed for allergies.  Dispense: 30 tablet; Refill: 11  3.  Anemia, unspecified type Hgb slightly low during hospital visit. - CBC with Differential/Platelet - BMP with eGFR([facility])  4. Hypoalbuminemia Alb  slight low.Encourages to drink Ensure twice daily.  - BMP with eGFR([facility])  Family/ staff Communication: Reviewed plan of care with patient,patient's wife and daughter.   Labs/tests ordered:  - CBC with Differential/Platelet - BMP with eGFR([facility])  Next Appointment: Has upcoming appointment 01/31/2020.  Sandrea Hughs, NP

## 2019-11-15 ENCOUNTER — Other Ambulatory Visit: Payer: Self-pay | Admitting: Nurse Practitioner

## 2019-11-15 DIAGNOSIS — D649 Anemia, unspecified: Secondary | ICD-10-CM

## 2019-11-15 LAB — CBC WITH DIFFERENTIAL/PLATELET
Absolute Monocytes: 638 cells/uL (ref 200–950)
Basophils Absolute: 50 cells/uL (ref 0–200)
Basophils Relative: 0.9 %
Eosinophils Absolute: 237 cells/uL (ref 15–500)
Eosinophils Relative: 4.3 %
HCT: 37.1 % — ABNORMAL LOW (ref 38.5–50.0)
Hemoglobin: 12.5 g/dL — ABNORMAL LOW (ref 13.2–17.1)
Lymphs Abs: 2156 cells/uL (ref 850–3900)
MCH: 31 pg (ref 27.0–33.0)
MCHC: 33.7 g/dL (ref 32.0–36.0)
MCV: 92.1 fL (ref 80.0–100.0)
MPV: 10.5 fL (ref 7.5–12.5)
Monocytes Relative: 11.6 %
Neutro Abs: 2420 cells/uL (ref 1500–7800)
Neutrophils Relative %: 44 %
Platelets: 204 10*3/uL (ref 140–400)
RBC: 4.03 10*6/uL — ABNORMAL LOW (ref 4.20–5.80)
RDW: 12 % (ref 11.0–15.0)
Total Lymphocyte: 39.2 %
WBC: 5.5 10*3/uL (ref 3.8–10.8)

## 2019-11-15 LAB — BASIC METABOLIC PANEL WITH GFR
BUN/Creatinine Ratio: 18 (calc) (ref 6–22)
BUN: 23 mg/dL (ref 7–25)
CO2: 29 mmol/L (ref 20–32)
Calcium: 9.2 mg/dL (ref 8.6–10.3)
Chloride: 104 mmol/L (ref 98–110)
Creat: 1.26 mg/dL — ABNORMAL HIGH (ref 0.70–1.11)
GFR, Est African American: 57 mL/min/{1.73_m2} — ABNORMAL LOW (ref >=60)
GFR, Est Non African American: 50 mL/min/{1.73_m2} — ABNORMAL LOW (ref >=60)
Glucose, Bld: 87 mg/dL (ref 65–139)
Potassium: 4.1 mmol/L (ref 3.5–5.3)
Sodium: 140 mmol/L (ref 135–146)

## 2019-11-18 MED ORDER — ASPIRIN 81 MG PO TBEC
81.00 | DELAYED_RELEASE_TABLET | ORAL | Status: DC
Start: 2019-11-18 — End: 2019-11-18

## 2019-11-18 MED ORDER — CIPROFLOXACIN HCL 500 MG PO TABS
500.00 | ORAL_TABLET | ORAL | Status: DC
Start: 2019-11-18 — End: 2019-11-18

## 2019-11-18 MED ORDER — ACETAMINOPHEN 325 MG PO TABS
650.00 | ORAL_TABLET | ORAL | Status: DC
Start: ? — End: 2019-11-18

## 2019-11-18 MED ORDER — RA PROBIOTIC DIGESTIVE CARE PO CAPS
1.00 | ORAL_CAPSULE | ORAL | Status: DC
Start: 2019-11-18 — End: 2019-11-18

## 2019-11-18 MED ORDER — ACETAMINOPHEN 500 MG PO TABS
500.00 | ORAL_TABLET | ORAL | Status: DC
Start: ? — End: 2019-11-18

## 2019-11-18 MED ORDER — GENERIC EXTERNAL MEDICATION
1.00 | Status: DC
Start: 2019-11-18 — End: 2019-11-18

## 2019-11-18 MED ORDER — BRIMONIDINE TARTRATE 0.2 % OP SOLN
1.00 | OPHTHALMIC | Status: DC
Start: 2019-11-18 — End: 2019-11-18

## 2019-11-18 MED ORDER — POLYETHYLENE GLYCOL 3350 17 GM/SCOOP PO POWD
17.00 | ORAL | Status: DC
Start: 2019-11-18 — End: 2019-11-18

## 2019-11-18 MED ORDER — ENOXAPARIN SODIUM 40 MG/0.4ML ~~LOC~~ SOLN
40.00 | SUBCUTANEOUS | Status: DC
Start: 2019-11-19 — End: 2019-11-18

## 2019-11-18 MED ORDER — DEXTROSE-SODIUM CHLORIDE 5-0.9 % IV SOLN
INTRAVENOUS | Status: DC
Start: ? — End: 2019-11-18

## 2019-11-28 DIAGNOSIS — R4182 Altered mental status, unspecified: Secondary | ICD-10-CM

## 2019-11-28 DIAGNOSIS — G9340 Encephalopathy, unspecified: Secondary | ICD-10-CM | POA: Insufficient documentation

## 2019-11-28 HISTORY — DX: Altered mental status, unspecified: R41.82

## 2019-12-25 DIAGNOSIS — R053 Chronic cough: Secondary | ICD-10-CM

## 2019-12-25 DIAGNOSIS — R0683 Snoring: Secondary | ICD-10-CM

## 2019-12-25 DIAGNOSIS — R400 Somnolence: Secondary | ICD-10-CM | POA: Insufficient documentation

## 2019-12-25 DIAGNOSIS — R0681 Apnea, not elsewhere classified: Secondary | ICD-10-CM | POA: Insufficient documentation

## 2019-12-25 HISTORY — DX: Apnea, not elsewhere classified: R06.81

## 2019-12-25 HISTORY — DX: Chronic cough: R05.3

## 2019-12-25 HISTORY — DX: Somnolence: R40.0

## 2019-12-25 HISTORY — DX: Snoring: R06.83

## 2020-01-06 ENCOUNTER — Other Ambulatory Visit: Payer: Self-pay

## 2020-01-06 ENCOUNTER — Ambulatory Visit: Payer: Medicare Other | Admitting: Neurology

## 2020-01-06 ENCOUNTER — Encounter: Payer: Self-pay | Admitting: Neurology

## 2020-01-06 VITALS — BP 106/58 | HR 60 | Ht 67.0 in | Wt 127.0 lb

## 2020-01-06 DIAGNOSIS — R404 Transient alteration of awareness: Secondary | ICD-10-CM

## 2020-01-06 DIAGNOSIS — F03B0 Unspecified dementia, moderate, without behavioral disturbance, psychotic disturbance, mood disturbance, and anxiety: Secondary | ICD-10-CM

## 2020-01-06 DIAGNOSIS — F0390 Unspecified dementia without behavioral disturbance: Secondary | ICD-10-CM | POA: Diagnosis not present
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
## 2020-01-06 NOTE — Patient Instructions (Signed)
Always good to see you! Let's do an EEG to make sure the brain waves look ok. Continue 24/7 care. Follow-up in 6-7 months, call for any changes.

## 2020-01-07 ENCOUNTER — Encounter: Payer: Self-pay | Admitting: General Practice

## 2020-01-08 ENCOUNTER — Ambulatory Visit (INDEPENDENT_AMBULATORY_CARE_PROVIDER_SITE_OTHER): Payer: Medicare Other | Admitting: Neurology

## 2020-01-08 ENCOUNTER — Other Ambulatory Visit: Payer: Self-pay

## 2020-01-08 DIAGNOSIS — F03B0 Unspecified dementia, moderate, without behavioral disturbance, psychotic disturbance, mood disturbance, and anxiety: Secondary | ICD-10-CM

## 2020-01-08 DIAGNOSIS — R404 Transient alteration of awareness: Secondary | ICD-10-CM

## 2020-01-08 DIAGNOSIS — F0390 Unspecified dementia without behavioral disturbance: Secondary | ICD-10-CM | POA: Diagnosis not present

## 2020-01-13 NOTE — Procedures (Signed)
ELECTROENCEPHALOGRAM REPORT  Date of Study: 01/08/2020  Patient's Name: [name] MRN: [id number] Date of Birth: 11/07/1925  Referring Provider: Dr. Ellouise Newer  Clinical History: This is a 86 year old man with recurrent episodes of decreased responsiveness.   Medications: TYLENOL 500 MG tablet aspirin EC 81 MG tablet ALPHAGAN 0.2 % ophthalmic solution VANTIN 100 MG tablet LOTRIMIN 1 % cream COSOPT 22.3-6.8 MG/ML ophthalmic solution CLARITIN 10 MG tablet MULTIVITAMIN tablet MYCOSTATIN/NYSTOP powder PRILOSEC 10 MG capsule MIRALAX / GLYCOLAX 17 g packet FLORASTOR BABY 250 MG PACK PEPCID 20 MG tablet   Technical Summary: A multichannel digital EEG recording measured by the international 10-20 system with electrodes applied with paste and impedances below 5000 ohms performed as portable with EKG monitoring in an awake and asleep patient.  Hyperventilation was not performed. Photic stimulation was performed.  The digital EEG was referentially recorded, reformatted, and digitally filtered in a variety of bipolar and referential montages for optimal display.   Description: The patient is awake and asleep during the recording.  During maximal wakefulness, there is a symmetric, medium voltage 5-6 Hz posterior dominant rhythm that attenuates with eye opening. This is admixed with a small amount of diffuse 5 Hz theta slowing of the waking background.  During drowsiness and sleep, there is an increase in theta and delta slowing of the background with occasional vertex waves seen. Photic stimulation did not elicit any abnormalities.  There were no epileptiform discharges or electrographic seizures seen.    EKG lead showed sinus bradycardia at 54 bpm.  Impression: This awake and asleep EEG is abnormal due to mild diffuse slowing of the waking background with slowing of the posterior dominant rhythm.  Clinical Correlation of the above findings indicates diffuse cerebral dysfunction that  is non-specific in etiology and can be seen with hypoxic/ischemic injury, toxic/metabolic encephalopathies, neurodegenerative disorders, or medication effect.  The absence of epileptiform discharges does not rule out a clinical diagnosis of epilepsy.  Clinical correlation is advised.   Ellouise Newer, M.D.

## 2020-01-15 ENCOUNTER — Telehealth: Payer: Self-pay

## 2020-01-15 NOTE — Telephone Encounter (Signed)
-----   Message from [name], MD sent at 01/13/2020  5:03 PM EDT ----- Pls let wife know that his EEG showed age-related changes but no concerning findings such as seizure activity. Thanks

## 2020-01-15 NOTE — Telephone Encounter (Signed)
Pt wife called and informed that EEG showed age-related changes but no concerning findings such as seizure activity

## 2020-02-04 ENCOUNTER — Ambulatory Visit: Payer: Medicare Other | Admitting: Nurse Practitioner

## 2020-02-10 ENCOUNTER — Ambulatory Visit: Payer: Medicare Other | Admitting: Cardiology

## 2020-02-11 ENCOUNTER — Encounter: Payer: Self-pay | Admitting: Cardiology

## 2020-02-11 ENCOUNTER — Other Ambulatory Visit: Payer: Self-pay

## 2020-02-11 ENCOUNTER — Ambulatory Visit (INDEPENDENT_AMBULATORY_CARE_PROVIDER_SITE_OTHER): Payer: Medicare Other | Admitting: Cardiology

## 2020-02-11 VITALS — BP 110/78 | HR 72 | Ht 67.0 in | Wt 128.0 lb

## 2020-02-11 DIAGNOSIS — I4510 Unspecified right bundle-branch block: Secondary | ICD-10-CM

## 2020-02-11 DIAGNOSIS — E781 Pure hyperglyceridemia: Secondary | ICD-10-CM | POA: Diagnosis not present

## 2020-02-11 DIAGNOSIS — I10 Essential (primary) hypertension: Secondary | ICD-10-CM | POA: Diagnosis not present

## 2020-02-11 NOTE — Progress Notes (Signed)
Cardiology Office Note:    Date:  02/11/2020   ID:  [name], DOB 01-25-1926, MRN [id number]  PCP:  [name], NP  Cardiologist:  Jenne Campus, MD    Referring MD: [name], NP   Chief Complaint  Patient presents with  . Follow-up  I am doing fine  History of Present Illness:    [name] is a 86 y.o. male with past medical history significant for dementia, bifascicular block, essential hypertension.  Comes today to my office like always with his daughter.  He is doing fine denies have any dizziness syncope passing out, he spent time sitting on the chair sometimes he walks a little bit but overall very sedentary lifestyle without complaints.  Denies have any chest pain tightness squeezing pressure burning chest  Past Medical History:  Diagnosis Date  . BPH (benign prostatic hypertrophy)   . Cataract    Both  . Dementia ([place])   . Diverticula of colon   . Elevated lactic acid level   . Gallbladder polyp   . Glaucoma 1986   Dr. Mila Merry  . Gram negative sepsis ([place])   . Hearing loss   . Hx of adenomatous colonic polyps   . Internal hemorrhoids   . Left varicocele   . Liver cyst   . Obesity   . Pseudodementia   . RBBB (right bundle branch block)   . Urine retention   . UTI (urinary tract infection)     Past Surgical History:  Procedure Laterality Date  . CATARACT EXTRACTION Right   . GLAUCOMA REPAIR Left   . KNEE SURGERY    . TRANSURETHRAL RESECTION OF PROSTATE  07/16/2018   [facility]    Current Medications: Current Meds  Medication Sig  . acetaminophen (TYLENOL) 500 MG tablet Take 500 mg by mouth as needed.  Marland Kitchen aspirin EC 81 MG tablet Take 81 mg by mouth daily.  . brimonidine (ALPHAGAN) 0.2 % ophthalmic solution Place 1 drop into the left eye Twice daily.   . clotrimazole (LOTRIMIN) 1 % cream Apply 1 application topically 2 (two) times daily as needed.  . dorzolamide-timolol (COSOPT) 22.3-6.8 MG/ML ophthalmic solution  Place 1 drop into the left eye 2 (two) times daily.  Marland Kitchen loratadine (CLARITIN) 10 MG tablet Take 1 tablet (10 mg total) by mouth daily as needed for allergies.  . Multiple Vitamin (MULTIVITAMIN) tablet Take 1 tablet by mouth daily.  Marland Kitchen nystatin (MYCOSTATIN/NYSTOP) powder Apply topically 3 (three) times daily.  . polyethylene glycol (MIRALAX / GLYCOLAX) 17 g packet Take 17 g by mouth daily.  . Saccharomyces boulardii (FLORASTOR BABY) 250 MG PACK Take 250 mg by mouth daily.  . [DISCONTINUED] cefpodoxime (VANTIN) 100 MG tablet Take 100 mg by mouth 2 (two) times daily.     Allergies:   Patient has no known allergies.   Social History   Socioeconomic History  . Marital status: Married    Spouse name: Not on file  . Number of children: 4  . Years of education: Not on file  . Highest education level: Not on file  Occupational History  . Not on file  Tobacco Use  . Smoking status: Former Smoker    Packs/day: 0.30    Years: 10.00    Pack years: 3.00    Types: Cigarettes    Quit date: 06/13/1955    Years since quitting: 64.7  . Smokeless tobacco: Never Used  Vaping Use  . Vaping Use: Never used  Substance and Sexual Activity  . Alcohol use: No  . Drug use: No  . Sexual activity: Not Currently  Other Topics Concern  . Not on file  Social History Narrative   Social History      Diet?       Do you drink/eat things with caffeine? Coffee, tea      Marital status?           married                         What year were you married? 1957      Do you live in a house, apartment, assisted living, condo, trailer, etc.? One Story house      Is it one or more stories? one      How many persons live in your home? 2      Do you have any pets in your home? (please list) no      Highest level of education completed? High school      Current or past profession: Camera operator      Do you exercise?           yes                           Type & how often? Walking  everyday weather permitting      Do you have a living will? yes      Do you have a DNR form?      no                            If not, do you want to discuss one?      Do you have signed POA/HPOA for forms? yes      Functional Status      Do you have difficulty bathing or dressing yourself? no      Do you have difficulty preparing food or eating? no      Do you have difficulty managing your medications? no      Do you have difficulty managing your finances? yes      Do you have difficulty affording your medications? no   Social Determinants of Health   Financial Resource Strain:   . Difficulty of Paying Living Expenses: Not on file  Food Insecurity:   . Worried About Charity fundraiser in the Last Year: Not on file  . Ran Out of Food in the Last Year: Not on file  Transportation Needs:   . Lack of Transportation (Medical): Not on file  . Lack of Transportation (Non-Medical): Not on file  Physical Activity:   . Days of Exercise per Week: Not on file  . Minutes of Exercise per Session: Not on file  Stress:   . Feeling of Stress : Not on file  Social Connections:   . Frequency of Communication with Friends and Family: Not on file  . Frequency of Social Gatherings with Friends and Family: Not on file  . Attends Religious Services: Not on file  . Active Member of Clubs or Organizations: Not on file  . Attends Archivist Meetings: Not on file  . Marital Status: Not on file     Family History: The patient's family history includes Cancer in his mother; Heart disease (age of onset: 13) in  his father; Parkinson's disease in his father. There is no history of Colon cancer. ROS:   Please see the history of present illness.    All 14 point review of systems negative except as described per history of present illness  EKGs/Labs/Other Studies Reviewed:      Recent Labs: 07/30/2019: ALT 14; TSH 3.86 11/14/2019: BUN 23; Creat 1.26; Hemoglobin 12.5; Platelets 204;  Potassium 4.1; Sodium 140  Recent Lipid Panel    Component Value Date/Time   CHOL 186 03/16/2017 0924   TRIG 49 03/16/2017 0924   TRIG 61 05/11/2006 1035   HDL 76 03/16/2017 0924   CHOLHDL 2.4 03/16/2017 0924   VLDL 12.6 09/15/2015 1359   LDLCALC 96 03/16/2017 0924   LDLDIRECT 106.9 12/26/2011 0946    Physical Exam:    VS:  BP 110/78 (BP Location: Right Arm, Patient Position: Sitting, Cuff Size: Normal)   Pulse 72   Ht 5\' 7"  (1.702 m)   Wt 128 lb (58.1 kg)   SpO2 99%   BMI 20.05 kg/m     Wt Readings from Last 3 Encounters:  02/11/20 128 lb (58.1 kg)  01/06/20 127 lb (57.6 kg)  11/14/19 139 lb 14.4 oz (63.5 kg)     GEN:  Well nourished, well developed in no acute distress HEENT: Normal NECK: No JVD; No carotid bruits LYMPHATICS: No lymphadenopathy CARDIAC: RRR, no murmurs, no rubs, no gallops RESPIRATORY:  Clear to auscultation without rales, wheezing or rhonchi  ABDOMEN: Soft, non-tender, non-distended MUSCULOSKELETAL:  No edema; No deformity  SKIN: Warm and dry LOWER EXTREMITIES: no swelling NEUROLOGIC:  Alert and oriented x 3 PSYCHIATRIC:  Normal affect   ASSESSMENT:    1. BUNDLE BRANCH BLOCK, RIGHT   2. Pure hypertriglyceridemia   3. Essential hypertension    PLAN:    In order of problems listed above:  1. Bundle branch block with bifascicular block, asymptomatic.  We will continue monitoring.  Next year we will repeat monitor.  I warned his daughter that signs and symptoms of deterioration of this problem will be passing out of dizziness and ask you to let me know if that happens. 2. Essential hypertension: Blood pressure well controlled today we will continue present management. 3. Dyslipidemia: Usefulness of statin therapy and his clinical status questionable.  We will continue monitoring.   Medication Adjustments/Labs and Tests Ordered: Current medicines are reviewed at length with the patient today.  Concerns regarding medicines are outlined above.    No orders of the defined types were placed in this encounter.  Medication changes: No orders of the defined types were placed in this encounter.   Signed, Park Liter, MD, [facility] 02/11/2020 11:19 AM    [place]

## 2020-02-11 NOTE — Patient Instructions (Signed)

## 2020-02-20 ENCOUNTER — Other Ambulatory Visit: Payer: Self-pay

## 2020-02-20 ENCOUNTER — Ambulatory Visit (INDEPENDENT_AMBULATORY_CARE_PROVIDER_SITE_OTHER): Payer: Medicare Other | Admitting: Nurse Practitioner

## 2020-02-20 ENCOUNTER — Encounter: Payer: Self-pay | Admitting: Nurse Practitioner

## 2020-02-20 VITALS — BP 118/70 | HR 54 | Temp 96.8°F | Ht 67.0 in | Wt 125.0 lb

## 2020-02-20 DIAGNOSIS — E538 Deficiency of other specified B group vitamins: Secondary | ICD-10-CM

## 2020-02-20 DIAGNOSIS — Z23 Encounter for immunization: Secondary | ICD-10-CM | POA: Diagnosis not present

## 2020-02-20 DIAGNOSIS — R339 Retention of urine, unspecified: Secondary | ICD-10-CM | POA: Diagnosis not present

## 2020-02-20 DIAGNOSIS — N1832 Chronic kidney disease, stage 3b: Secondary | ICD-10-CM

## 2020-02-20 DIAGNOSIS — L299 Pruritus, unspecified: Secondary | ICD-10-CM

## 2020-02-20 DIAGNOSIS — F0390 Unspecified dementia without behavioral disturbance: Secondary | ICD-10-CM

## 2020-02-20 DIAGNOSIS — D649 Anemia, unspecified: Secondary | ICD-10-CM

## 2020-02-20 NOTE — Progress Notes (Signed)
Careteam: Patient Care Team: Dave Chandler, NP as PCP - General (Geriatric Medicine) Dave Natal, MD as Consulting Physician (Ophthalmology) Dave Sprang, MD as Consulting Physician (Neurology) Dave Rockers, MD as Consulting Physician (Pulmonary Disease)  PLACE OF SERVICE:  [facility] Directive information Does Patient Have a Medical Advance Directive?: Yes, Type of Advance Directive: [place];Out of facility DNR (pink MOST or yellow form), Pre-existing out of facility DNR order (yellow form or pink MOST form): Yellow form placed in chart (order not valid for inpatient use);Pink MOST form placed in chart (order not valid for inpatient use), Does patient want to make changes to medical advance directive?: No - Patient declined  No Known Allergies  Chief Complaint  Patient presents with  . Medical Management of Chronic Issues    4 month follow-up. FYI will get a sleep study on Monday   . Immunizations    High dose flu vaccine today   . Pruritis    Itching , tried OTC medications with no relief. Referral request to dermatology - [place]   . B12 Inquiry    Discuss if patient would benefit from b12 or pedilyte  . Supplement    Discuss B12/engery supplement      HPI: Patient is a 86 y.o. male for routine follow up.   Neurologist saw him at hospital and recommended sleep study due to symptoms of sleep apnea.   Itching- reports he itches all over, head, back, he talks frequently about itching. Using Eucerin and cerave OTC without benefit. Wants a dermatologist to evaluate.   Hypertension- well controlled. Not currently on medication  neurogenic bladder- followed by urology, routinely with catheter change.   Review of Systems:  Review of Systems  Unable to perform ROS: Dementia     Past Medical History:  Diagnosis Date  . BPH (benign prostatic hypertrophy)   . Cataract    Both  . Dementia ([place])   . Diverticula of colon     . Elevated lactic acid level   . Gallbladder polyp   . Glaucoma 1986   Dr. Mila Merry  . Gram negative sepsis ([place])   . Hearing loss   . Hx of adenomatous colonic polyps   . Internal hemorrhoids   . Left varicocele   . Liver cyst   . Obesity   . Pseudodementia   . RBBB (right bundle branch block)   . Urine retention   . UTI (urinary tract infection)    Past Surgical History:  Procedure Laterality Date  . CATARACT EXTRACTION Right   . GLAUCOMA REPAIR Left   . KNEE SURGERY    . TRANSURETHRAL RESECTION OF PROSTATE  07/16/2018   [facility]   Social History:   reports that he quit smoking about 64 years ago. His smoking use included cigarettes. He has a 3.00 pack-year smoking history. He has never used smokeless tobacco. He reports that he does not drink alcohol and does not use drugs.  Family History  Problem Relation Age of Onset  . Heart disease Father 78  . Parkinson's disease Father   . Cancer Mother        ? type  . Colon cancer Neg Hx     Medications: Patient's Medications  New Prescriptions   No medications on file  Previous Medications   ACETAMINOPHEN (TYLENOL) 500 MG TABLET    Take 500 mg by mouth as needed.   ASPIRIN EC 81 MG TABLET  Take 81 mg by mouth daily.   BRIMONIDINE (ALPHAGAN) 0.2 % OPHTHALMIC SOLUTION    Place 1 drop into the left eye Twice daily.    CLOTRIMAZOLE (LOTRIMIN) 1 % CREAM    Apply 1 application topically 2 (two) times daily as needed.   DORZOLAMIDE-TIMOLOL (COSOPT) 22.3-6.8 MG/ML OPHTHALMIC SOLUTION    Place 1 drop into the left eye 2 (two) times daily.   LORATADINE (CLARITIN) 10 MG TABLET    Take 1 tablet (10 mg total) by mouth daily as needed for allergies.   MULTIPLE VITAMIN (MULTIVITAMIN) TABLET    Take 1 tablet by mouth daily.   NYSTATIN (MYCOSTATIN/NYSTOP) POWDER    Apply topically 3 (three) times daily.   POLYETHYLENE GLYCOL (MIRALAX / GLYCOLAX) 17 G PACKET    Take 17 g by mouth daily.   SACCHAROMYCES BOULARDII (FLORASTOR  BABY) 250 MG PACK    Take 250 mg by mouth daily.  Modified Medications   No medications on file  Discontinued Medications   No medications on file    Physical Exam:  Vitals:   02/20/20 1133  BP: 118/70  Pulse: (!) 54  Temp: (!) 96.8 F (36 C)  TempSrc: Temporal  SpO2: 98%  Weight: 125 lb (56.7 kg)  Height: 5\' 7"  (1.702 m)   Body mass index is 19.58 kg/m. Wt Readings from Last 3 Encounters:  02/20/20 125 lb (56.7 kg)  02/11/20 128 lb (58.1 kg)  01/06/20 127 lb (57.6 kg)    Physical Exam Constitutional:      General: He is not in acute distress.    Appearance: He is well-developed. He is not diaphoretic.  HENT:     Head: Normocephalic and atraumatic.     Mouth/Throat:     Pharynx: No oropharyngeal exudate.  Eyes:     Conjunctiva/sclera: Conjunctivae normal.     Pupils: Pupils are equal, round, and reactive to light.  Cardiovascular:     Rate and Rhythm: Normal rate and regular rhythm.     Heart sounds: Normal heart sounds.  Pulmonary:     Effort: Pulmonary effort is normal.     Breath sounds: Normal breath sounds.  Abdominal:     General: Bowel sounds are normal.     Palpations: Abdomen is soft.  Musculoskeletal:        General: No tenderness.     Cervical back: Normal range of motion and neck supple.  Skin:    General: Skin is warm and dry.  Neurological:     Mental Status: He is alert and oriented to person, place, and time.  Psychiatric:        Mood and Affect: Mood normal.        Behavior: Behavior normal.     Labs reviewed: Basic Metabolic Panel: Recent Labs    02/25/19 1558 07/30/19 1151 11/14/19 1353  NA 139 139 140  K 4.2 4.6 4.1  CL 102 101 104  CO2 28 28 29   GLUCOSE 83 83 87  BUN 19 20 23   CREATININE 1.26* 1.35* 1.26*  CALCIUM 9.6 10.3 9.2  TSH  --  3.86  --    Liver Function Tests: Recent Labs    07/30/19 1151  AST 23  ALT 14  BILITOT 0.6  PROT 7.9   No results for input(s): LIPASE, AMYLASE in the last 8760 hours. No  results for input(s): AMMONIA in the last 8760 hours. CBC: Recent Labs    02/25/19 1558 07/30/19 1151 11/14/19 1353  WBC 4.5 4.2 5.5  NEUTROABS 2,034 1,709 2,420  HGB 13.4 15.0 12.5*  HCT 39.7 44.5 37.1*  MCV 92.1 93.7 92.1  PLT 201 227 204   Lipid Panel: No results for input(s): CHOL, HDL, LDLCALC, TRIG, CHOLHDL, LDLDIRECT in the last 8760 hours. TSH: Recent Labs    07/30/19 1151  TSH 3.86   A1C: No results found for: HGBA1C   Assessment/Plan 1. Need for influenza vaccination - Flu Vaccine QUAD High Dose(Fluad)  2. B12 deficiency -has not been on supplement recently - CBC with Differential/Platelet - Vitamin B12  3. Dementia without behavioral disturbance, unspecified dementia type (HCC) Ongoing, slow decline without acute changes to cognitive or functional status, wife provides most of the care at this time.    4. Urinary retention Stable with catheter and urology follow up.   5. Stage 3b chronic kidney disease Encourage proper hydration and to avoid NSAIDS (Aleve, Advil, Motrin, Ibuprofen)  - COMPLETE METABOLIC PANEL WITH GFR  6. Pruritus -continue to have itching despite cereve and eucerin cream. Encouraged free and clear laundry detergent To use claritin daily Avoid hot showers Use cream right after shower (no scent) - Ambulatory referral to Dermatology  7. Anemia, unspecified type - CBC with Differential/Platelet   Next appt: 4 months.  [name]. [place], [facility] [phone]

## 2020-02-20 NOTE — Patient Instructions (Addendum)
Can use Claritin daily to help with itch Dermatology referral placed Would limit caffeine use but B12 supplement is fine.   Pruritus Pruritus is an itchy feeling on the skin. One of the most common causes is dry skin, but many different things can cause itching. Most cases of itching do not require medical attention. Sometimes itchy skin can turn into a rash. Follow these instructions at home: Skin care   Apply moisturizing lotion to your skin as needed. Lotion that contains petroleum jelly is best.  Take medicines or apply medicated creams only as told by your health care provider. This may include: ? Corticosteroid cream. ? Anti-itch lotions. ? Oral antihistamines.  Apply a cool, wet cloth (cool compress) to the affected areas.  Take baths with one of the following: ? Epsom salts. You can get these at your local pharmacy or grocery store. Follow the instructions on the packaging. ? Baking soda. Pour a small amount into the bath as told by your health care provider. ? Colloidal oatmeal. You can get this at your local pharmacy or grocery store. Follow the instructions on the packaging.  Apply baking soda paste to your skin. To make the paste, stir water into a small amount of baking soda until it reaches a paste-like consistency.  Do not scratch your skin.  Do not take hot showers or baths, which can make itching worse. A cool shower may help with itching as long as you apply moisturizing lotion after the shower.  Do not use scented soaps, detergents, perfumes, and cosmetic products. Instead, use gentle, unscented versions of these items. General instructions  Avoid wearing tight clothes.  Keep a journal to help find out what is causing your itching. Write down: ? What you eat and drink. ? What cosmetic products you use. ? What soaps or detergents you use. ? What you wear, including jewelry.  Use a humidifier. This keeps the air moist, which helps to prevent dry  skin.  Be aware of any changes in your itchiness. Contact a health care provider if:  The itching does not go away after several days.  You are unusually thirsty or urinating more than normal.  Your skin tingles or feels numb.  Your skin or the white parts of your eyes turn yellow (jaundice).  You feel weak.  You have any of the following: ? Night sweats. ? Tiredness (fatigue). ? Weight loss. ? Abdominal pain. Summary  Pruritus is an itchy feeling on the skin. One of the most common causes is dry skin, but many different conditions and factors can cause itching.  Apply moisturizing lotion to your skin as needed. Lotion that contains petroleum jelly is best.  Take medicines or apply medicated creams only as told by your health care provider.  Do not take hot showers or baths. Do not use scented soaps, detergents, perfumes, or cosmetic products. This information is not intended to replace advice given to you by your health care provider. Make sure you discuss any questions you have with your health care provider. Document Revised: 06/12/2017 Document Reviewed: 06/12/2017 Elsevier Patient Education  [place].  Seborrheic Keratosis A seborrheic keratosis is a common, noncancerous (benign) skin growth. These growths are velvety, waxy, rough, tan, brown, or black spots that appear on the skin. These skin growths can be flat or raised, and scaly. What are the causes? The cause of this condition is not known. What increases the risk? You are more likely to develop this condition if  you:  Have a family history of seborrheic keratosis.  Are 50 or older.  Are pregnant.  Have had estrogen replacement therapy. What are the signs or symptoms? Symptoms of this condition include growths on the face, chest, shoulders, back, or other areas. These growths:  Are usually painless, but may become irritated and itchy.  Can be yellow, brown, black, or other colors.  Are  slightly raised or have a flat surface.  Are sometimes rough or wart-like in texture.  Are often velvety or waxy on the surface.  Are round or oval-shaped.  Often occur in groups, but may occur as a single growth. How is this diagnosed? This condition is diagnosed with a medical history and physical exam.  A sample of the growth may be tested (skin biopsy).  You may need to see a skin specialist (dermatologist). How is this treated? Treatment is not usually needed for this condition, unless the growths are irritated or bleed often.  You may also choose to have the growths removed if you do not like their appearance. ? Most commonly, these growths are treated with a procedure in which liquid nitrogen is applied to "freeze" off the growth (cryosurgery). ? They may also be burned off with electricity (electrocautery) or removed by scraping (curettage). Follow these instructions at home:  Watch your growth for any changes.  Keep all follow-up visits as told by your health care provider. This is important.  Do not scratch or pick at the growth or growths. This can cause them to become irritated or infected. Contact a health care provider if:  You suddenly have many new growths.  Your growth bleeds, itches, or hurts.  Your growth suddenly becomes larger or changes color. Summary  A seborrheic keratosis is a common, noncancerous (benign) skin growth.  Treatment is not usually needed for this condition, unless the growths are irritated or bleed often.  Watch your growth for any changes.  Contact a health care provider if you suddenly have many new growths or your growth suddenly becomes larger or changes color.  Keep all follow-up visits as told by your health care provider. This is important. This information is not intended to replace advice given to you by your health care provider. Make sure you discuss any questions you have with your health care provider. Document  Revised: 10/11/2017 Document Reviewed: 10/11/2017 Elsevier Patient Education  [place].

## 2020-02-26 ENCOUNTER — Other Ambulatory Visit: Payer: Self-pay | Admitting: Nurse Practitioner

## 2020-02-26 DIAGNOSIS — E538 Deficiency of other specified B group vitamins: Secondary | ICD-10-CM

## 2020-02-26 DIAGNOSIS — N1832 Chronic kidney disease, stage 3b: Secondary | ICD-10-CM

## 2020-03-02 ENCOUNTER — Other Ambulatory Visit: Payer: Medicare Other

## 2020-03-04 ENCOUNTER — Other Ambulatory Visit: Payer: Self-pay

## 2020-03-04 ENCOUNTER — Other Ambulatory Visit: Payer: Medicare Other

## 2020-03-05 LAB — COMPLETE METABOLIC PANEL WITH GFR
AG Ratio: 1.1 (calc) (ref 1.0–2.5)
ALT: 11 U/L (ref 9–46)
AST: 19 U/L (ref 10–35)
Albumin: 3.7 g/dL (ref 3.6–5.1)
Alkaline phosphatase (APISO): 172 U/L — ABNORMAL HIGH (ref 35–144)
BUN/Creatinine Ratio: 15 (calc) (ref 6–22)
BUN: 20 mg/dL (ref 7–25)
CO2: 28 mmol/L (ref 20–32)
Calcium: 9.6 mg/dL (ref 8.6–10.3)
Chloride: 102 mmol/L (ref 98–110)
Creat: 1.32 mg/dL — ABNORMAL HIGH (ref 0.70–1.11)
GFR, Est African American: 54 mL/min/{1.73_m2} — ABNORMAL LOW (ref >=60)
GFR, Est Non African American: 47 mL/min/{1.73_m2} — ABNORMAL LOW (ref >=60)
Globulin: 3.5 g/dL (calc) (ref 1.9–3.7)
Glucose, Bld: 84 mg/dL (ref 65–99)
Potassium: 4.2 mmol/L (ref 3.5–5.3)
Sodium: 138 mmol/L (ref 135–146)
Total Bilirubin: 0.5 mg/dL (ref 0.2–1.2)
Total Protein: 7.2 g/dL (ref 6.1–8.1)

## 2020-03-05 LAB — CBC WITH DIFFERENTIAL/PLATELET
Absolute Monocytes: 365 cells/uL (ref 200–950)
Basophils Absolute: 30 cells/uL (ref 0–200)
Basophils Relative: 0.8 %
Eosinophils Absolute: 247 cells/uL (ref 15–500)
Eosinophils Relative: 6.5 %
HCT: 39.4 % (ref 38.5–50.0)
Hemoglobin: 13.2 g/dL (ref 13.2–17.1)
Lymphs Abs: 1543 cells/uL (ref 850–3900)
MCH: 31.1 pg (ref 27.0–33.0)
MCHC: 33.5 g/dL (ref 32.0–36.0)
MCV: 92.9 fL (ref 80.0–100.0)
MPV: 11.7 fL (ref 7.5–12.5)
Monocytes Relative: 9.6 %
Neutro Abs: 1615 cells/uL (ref 1500–7800)
Neutrophils Relative %: 42.5 %
Platelets: 139 10*3/uL — ABNORMAL LOW (ref 140–400)
RBC: 4.24 10*6/uL (ref 4.20–5.80)
RDW: 11.9 % (ref 11.0–15.0)
Total Lymphocyte: 40.6 %
WBC: 3.8 10*3/uL (ref 3.8–10.8)

## 2020-03-05 LAB — VITAMIN B12: Vitamin B-12: 607 pg/mL (ref 200–1100)

## 2020-03-16 ENCOUNTER — Other Ambulatory Visit: Payer: Self-pay

## 2020-03-16 ENCOUNTER — Ambulatory Visit (INDEPENDENT_AMBULATORY_CARE_PROVIDER_SITE_OTHER): Payer: Medicare Other | Admitting: Nurse Practitioner

## 2020-03-16 ENCOUNTER — Telehealth: Payer: Self-pay

## 2020-03-16 ENCOUNTER — Encounter: Payer: Self-pay | Admitting: Nurse Practitioner

## 2020-03-16 DIAGNOSIS — Z0000 Encounter for general adult medical examination without abnormal findings: Secondary | ICD-10-CM | POA: Diagnosis not present

## 2020-03-16 NOTE — Telephone Encounter (Signed)
Thanks. I will let them know.

## 2020-03-16 NOTE — Telephone Encounter (Signed)
Called patient, spoke with [name], wife, to let him know that [name] put in referral to [facility] management and that they were not a candidate for those services, [name] was also told that we will be sending some information out for them to review. She would like to know why they were not a candidate.

## 2020-03-16 NOTE — Progress Notes (Signed)
Subjective:   [name] is a 86 y.o. male who presents for Medicare Annual/Subsequent preventive examination.  Review of Systems     Cardiac Risk Factors include: advanced age (>12men, >23 women);sedentary lifestyle;male gender;hypertension;dyslipidemia     Objective:    There were no vitals filed for this visit. There is no height or weight on file to calculate BMI.  Advanced Directives 03/16/2020 02/20/2020 01/06/2020 11/14/2019 10/01/2019 08/19/2019 06/04/2019  Does Patient Have a Medical Advance Directive? Yes Yes Yes Yes Yes Yes Yes  Type of Advance Directive - [place];Out of facility DNR (pink MOST or yellow form) Out of facility DNR (pink MOST or yellow form) Out of facility DNR (pink MOST or yellow form) [place];Out of facility DNR (pink MOST or yellow form) Healthcare Power of [place]  Does patient want to make changes to medical advance directive? No - Patient declined No - Patient declined - No - Patient declined No - Patient declined No - Patient declined No - Patient declined  Copy of [place] in Chart? - Yes - validated most recent copy scanned in chart (See row information) - - Yes - validated most recent copy scanned in chart (See row information) Yes - validated most recent copy scanned in chart (See row information) Yes - validated most recent copy scanned in chart (See row information)  Pre-existing out of facility DNR order (yellow form or pink MOST form) - Yellow form placed in chart (order not valid for inpatient use);Pink MOST form placed in chart (order not valid for inpatient use) - - - - -    Current Medications (verified) Outpatient Encounter Medications as of 03/16/2020  Medication Sig  . acetaminophen (TYLENOL) 500 MG tablet Take 500 mg by mouth as needed.  Marland Kitchen aspirin EC 81 MG tablet Take 81 mg by mouth daily.  . brimonidine (ALPHAGAN) 0.2 % ophthalmic solution Place 1  drop into the left eye Twice daily.   . clotrimazole (LOTRIMIN) 1 % cream Apply 1 application topically 2 (two) times daily as needed.  . dorzolamide-timolol (COSOPT) 22.3-6.8 MG/ML ophthalmic solution Place 1 drop into the left eye 2 (two) times daily.  Marland Kitchen loratadine (CLARITIN) 10 MG tablet Take 1 tablet (10 mg total) by mouth daily as needed for allergies.  . Multiple Vitamin (MULTIVITAMIN) tablet Take 1 tablet by mouth daily.  Marland Kitchen nystatin (MYCOSTATIN/NYSTOP) powder Apply topically 3 (three) times daily.  . polyethylene glycol (MIRALAX / GLYCOLAX) 17 g packet Take 17 g by mouth daily.  . Saccharomyces boulardii (FLORASTOR BABY) 250 MG PACK Take 250 mg by mouth daily.   No facility-administered encounter medications on file as of 03/16/2020.    Allergies (verified) Patient has no known allergies.   History: Past Medical History:  Diagnosis Date  . BPH (benign prostatic hypertrophy)   . Cataract    Both  . Dementia ([employer])   . Diverticula of colon   . Elevated lactic acid level   . Gallbladder polyp   . Glaucoma 1986   Dr. Mila Merry  . Gram negative sepsis ([place])   . Hearing loss   . Hx of adenomatous colonic polyps   . Internal hemorrhoids   . Left varicocele   . Liver cyst   . Obesity   . Pseudodementia   . RBBB (right bundle branch block)   . Urine retention   . UTI (urinary tract infection)    Past Surgical History:  Procedure  Laterality Date  . CATARACT EXTRACTION Right   . GLAUCOMA REPAIR Left   . KNEE SURGERY    . TRANSURETHRAL RESECTION OF PROSTATE  07/16/2018   [facility]   Family History  Problem Relation Age of Onset  . Heart disease Father 62  . Parkinson's disease Father   . Cancer Mother        ? type  . Colon cancer Neg Hx    Social History   Socioeconomic History  . Marital status: Married    Spouse name: Not on file  . Number of children: 4  . Years of education: Not on file  . Highest education level: Not on file  Occupational History  .  Not on file  Tobacco Use  . Smoking status: Former Smoker    Packs/day: 0.30    Years: 10.00    Pack years: 3.00    Types: Cigarettes    Quit date: 06/13/1955    Years since quitting: 64.8  . Smokeless tobacco: Never Used  Vaping Use  . Vaping Use: Never used  Substance and Sexual Activity  . Alcohol use: No  . Drug use: No  . Sexual activity: Not Currently  Other Topics Concern  . Not on file  Social History Narrative   Social History      Diet?       Do you drink/eat things with caffeine? Coffee, tea      Marital status?           married                         What year were you married? 1957      Do you live in a house, apartment, assisted living, condo, trailer, etc.? One Story house      Is it one or more stories? one      How many persons live in your home? 2      Do you have any pets in your home? (please list) no      Highest level of education completed? High school      Current or past profession: Camera operator      Do you exercise?           yes                           Type & how often? Walking everyday weather permitting      Do you have a living will? yes      Do you have a DNR form?      no                            If not, do you want to discuss one?      Do you have signed POA/HPOA for forms? yes      Functional Status      Do you have difficulty bathing or dressing yourself? no      Do you have difficulty preparing food or eating? no      Do you have difficulty managing your medications? no      Do you have difficulty managing your finances? yes      Do you have difficulty affording your medications? no   Social Determinants of Health   Financial Resource Strain:   . Difficulty  of Paying Living Expenses: Not on file  Food Insecurity:   . Worried About Charity fundraiser in the Last Year: Not on file  . Ran Out of Food in the Last Year: Not on file  Transportation Needs:   . Lack of Transportation  (Medical): Not on file  . Lack of Transportation (Non-Medical): Not on file  Physical Activity:   . Days of Exercise per Week: Not on file  . Minutes of Exercise per Session: Not on file  Stress:   . Feeling of Stress : Not on file  Social Connections:   . Frequency of Communication with Friends and Family: Not on file  . Frequency of Social Gatherings with Friends and Family: Not on file  . Attends Religious Services: Not on file  . Active Member of Clubs or Organizations: Not on file  . Attends Archivist Meetings: Not on file  . Marital Status: Not on file    Tobacco Counseling Counseling given: Not Answered   Clinical Intake:  Pre-visit preparation completed: Yes  Pain : No/denies pain     BMI - recorded: 19 Nutritional Status: BMI <19  Underweight Nutritional Risks: Unintentional weight loss, Failure to thrive Diabetes: No  How often do you need to have someone help you when you read instructions, pamphlets, or other written materials from your doctor or pharmacy?: 5 - Always  Diabetic?no         Activities of Daily Living In your present state of health, do you have any difficulty performing the following activities: 03/16/2020  Hearing? Y  Vision? Y  Difficulty concentrating or making decisions? Y  Walking or climbing stairs? Y  Dressing or bathing? Y  Doing errands, shopping? Y  Preparing Food and eating ? Y  Using the Toilet? Y  In the past six months, have you accidently leaked urine? Y  Do you have problems with loss of bowel control? N  Managing your Medications? Y  Managing your Finances? Y  Housekeeping or managing your Housekeeping? Y  Some recent data might be hidden    [name], NP as PCP - General (Geriatric Medicine) [name], MD as Consulting Physician (Ophthalmology) [name], MD as Consulting Physician (Neurology) [name], MD as Consulting Physician (Pulmonary  Disease)  Indicate any recent Medical Services you may have received from other than [facility] providers in the past year (date may be approximate).     Assessment:   This is a routine wellness examination for Rob.  Hearing/Vision screen No exam data present  Dietary issues and exercise activities discussed: Current Exercise Habits: The patient does not participate in regular exercise at present  Goals   None    Depression Screen PHQ 2/9 Scores 11/14/2019 03/03/2019 02/28/2018 06/22/2017  PHQ - 2 Score - 0 0 0  Exception Documentation Other- indicate reason in comment box - - -    Fall Risk Fall Risk  03/16/2020 01/06/2020 11/14/2019 10/01/2019 08/19/2019  Falls in the past year? 0 0 0 0 0  Number falls in past yr: 0 0 0 0 0  Injury with Fall? 0 0 0 0 0  Risk Factor Category  - - - - -  Risk for fall due to : - Impaired balance/gait - - -  Follow up - - - - -    Any stairs in or around the home? Yes  If so, are there any without handrails? No  Home free of  loose throw rugs in walkways, pet beds, electrical cords, etc? Yes  Adequate lighting in your home to reduce risk of falls? Yes   ASSISTIVE DEVICES UTILIZED TO PREVENT FALLS:  Life alert? No  Use of a cane, walker or w/c? Yes  Grab bars in the bathroom? Yes  Shower chair or bench in shower? Yes  Elevated toilet seat or a handicapped toilet? Yes   TIMED UP AND GO:  Was the test performed? No .    Cognitive Function: MMSE - Mini Mental State Exam 06/03/2019 04/02/2018 09/27/2017 03/27/2017 09/19/2016  Not completed: - - - Unable to complete -  Orientation to time 0 1 2 - 4  Orientation to Place 4 4 4  - 4  Registration 3 3 3  - 3  Attention/ Calculation 0 4 3 - 4  Recall 0 0 0 - 0  Language- name 2 objects 2 2 2  - 2  Language- repeat 0 1 1 - 1  Language- follow 3 step command 3 3 3  - 3  Language- read & follow direction 0 1 1 - 1  Write a sentence 0 1 1 - 1  Copy design 0 1 1 - 0  Total score 12 21 21  - 23   [place]  Cognitive Assessment  11/28/2018  Attention: Read list of digits (0/2) 2  Attention: Read list of letters (0/1) 1  Attention: Serial 7 subtraction starting at 100 (0/3) 0  Language: Repeat phrase (0/2) 0  Language : Fluency (0/1) 0  Abstraction (0/2) 0  Delayed Recall (0/5) 0  Orientation (0/6) 2   6CIT Screen 03/16/2020 03/03/2019  What Year? 4 points 4 points  What month? 3 points 3 points  What time? 3 points 3 points    Immunizations Immunization History  Administered Date(s) Administered  . Fluad Quad(high Dose 65+) 01/29/2019, 02/20/2020  . Influenza Split 03/29/2011, 03/13/2012  . Influenza Whole 03/11/2008, 03/26/2009, 02/24/2010  . Influenza, High Dose Seasonal PF 04/02/2013, 03/17/2014, 02/12/2015, 03/16/2016, 02/27/2017, 02/28/2018  . Influenza,inj,Quad PF,6+ Mos 04/02/2013, 03/17/2014, 02/12/2015, 02/27/2017, 02/28/2018  . PFIZER SARS-COV-2 Vaccination 07/19/2019, 08/09/2019  . Pneumococcal Conjugate-13 04/28/2015  . Pneumococcal Polysaccharide-23 09/10/2000  . Tdap 12/26/2011  . Zoster Recombinat (Shingrix) 04/27/2017, 01/29/2019    TDAP status: Up to date Flu Vaccine status: Up to date Pneumococcal vaccine status: Up to date Covid-19 vaccine status: Completed vaccines  Qualifies for Shingles Vaccine? Yes   Zostavax completed No   Shingrix Completed?: Yes  Screening Tests Health Maintenance  Topic Date Due  . TETANUS/TDAP  12/25/2021  . INFLUENZA VACCINE  Completed  . COVID-19 Vaccine  Completed  . PNA vac Low Risk Adult  Completed    Health Maintenance  There are no preventive care reminders to display for this patient.  Colorectal cancer screening: No longer required.   Lung Cancer Screening: (Low Dose CT Chest recommended if Age 66-80 years, 30 pack-year currently smoking OR have quit w/in 15years.) does not qualify.   Lung Cancer Screening Referral: na  Additional Screening:  Hepatitis C Screening: does not qualify; Completed na  Vision  Screening: Recommended annual ophthalmology exams for early detection of glaucoma and other disorders of the eye. Is the patient up to date with their annual eye exam?  Yes  Who is the provider or what is the name of the office in which the patient attends annual eye exams? Dr Lisbeth Ply If pt is not established with a provider, would they like to be referred to a provider to establish care?  No .   Dental Screening: Recommended annual dental exams for proper oral hygiene  Community Resource Referral / Chronic Care Management: CRR required this visit?  No   CCM required this visit?  No      Plan:     I have personally reviewed and noted the following in the patient's chart:   . Medical and social history . Use of alcohol, tobacco or illicit drugs  . Current medications and supplements . Functional ability and status . Nutritional status . Physical activity . Advanced directives . List of other physicians . Hospitalizations, surgeries, and ER visits in previous 12 months . Vitals . Screenings to include cognitive, depression, and falls . Referrals and appointments  In addition, I have reviewed and discussed with patient certain preventive protocols, quality metrics, and best practice recommendations. A written personalized care plan for preventive services as well as general preventive health recommendations were provided to patient.     [name], NP   03/16/2020    Virtual Visit via Telephone Note  I connected with@ on 03/16/20 at  1:00 PM EDT by telephone and verified that I am speaking with the correct person using two identifiers.  Location: Patient: home Provider: [place]   I discussed the limitations, risks, security and privacy concerns of performing an evaluation and management service by telephone and the availability of in person appointments. I also discussed with the patient that there may be a patient responsible charge related to this service. The patient  expressed understanding and agreed to proceed.   I discussed the assessment and treatment plan with the patient. The patient was provided an opportunity to ask questions and all were answered. The patient agreed with the plan and demonstrated an understanding of the instructions.   The patient was advised to call back or seek an in-person evaluation if the symptoms worsen or if the condition fails to improve as anticipated.  I provided 12 minutes of non-face-to-face time during this encounter.  [name]. [name] Avs printed and mailed

## 2020-03-16 NOTE — Telephone Encounter (Signed)
Called patient and told her that they were not a candidate because of their insurance, she understood and had no further questions.

## 2020-03-16 NOTE — Progress Notes (Signed)
    This service is provided via telemedicine  No vital signs collected/recorded due to the encounter was a telemedicine visit.   Location of patient (ex: home, work):  Home  Patient consents to a telephone visit:   Location of the provider (ex: office, home):  [facility]  Name of any referring provider:  N/A  Names of all persons participating in the telemedicine service and their role in the encounter:  Marisa Cyphers RMA, Sherrie Mustache NP, Patient and Patients Wife  Time spent on call: 10 min.

## 2020-03-16 NOTE — Telephone Encounter (Signed)
[name], [name] are scheduled for a virtual visit with your provider today.    Just as we do with appointments in the office, we must obtain your consent to participate.  Your consent will be active for this visit and any virtual visit you may have with one of our providers in the next 365 days.    If you have a MyChart account, I can also send a copy of this consent to you electronically.  All virtual visits are billed to your insurance company just like a traditional visit in the office.  As this is a virtual visit, video technology does not allow for your provider to perform a traditional examination.  This may limit your provider's ability to fully assess your condition.  If your provider identifies any concerns that need to be evaluated in person or the need to arrange testing such as labs, EKG, etc, we will make arrangements to do so.    Although advances in technology are sophisticated, we cannot ensure that it will always work on either your end or our end.  If the connection with a video visit is poor, we may have to switch to a telephone visit.  With either a video or telephone visit, we are not always able to ensure that we have a secure connection.   I need to obtain your verbal consent now.   Are you willing to proceed with your visit today?   [name] has provided verbal consent on 03/16/2020 for a virtual visit (video or telephone).   Carroll Kinds, CMA 03/16/2020  1:14 PM

## 2020-03-16 NOTE — Telephone Encounter (Signed)
This is due to their insurance.

## 2020-03-16 NOTE — Patient Instructions (Signed)
[name] , Thank you for taking time to come for your Medicare Wellness Visit. I appreciate your ongoing commitment to your health goals. Please review the following plan we discussed and let me know if I can assist you in the future.   Screening recommendations/referrals: Colonoscopy aged out Recommended yearly ophthalmology/optometry visit for glaucoma screening and checkup Recommended yearly dental visit for hygiene and checkup  Vaccinations: Influenza vaccine  Up to date Pneumococcal vaccine up to date Tdap vaccine up to date Shingles vaccine up to date   DUE for COVID booster- can get at local pharmacy to call and make appt   Advanced directives: on file   Conditions/risks identified: advanced age, dementia with progressive memory loss and weight loss  Next appointment: 1 year for AWV  Preventive Care 27 Years and Older, Male Preventive care refers to lifestyle choices and visits with your health care provider that can promote health and wellness. What does preventive care include?  A yearly physical exam. This is also called an annual well check.  Dental exams once or twice a year.  Routine eye exams. Ask your health care provider how often you should have your eyes checked.  Personal lifestyle choices, including:  Daily care of your teeth and gums.  Regular physical activity.  Eating a healthy diet.  Avoiding tobacco and drug use.  Limiting alcohol use.  Practicing safe sex.  Taking low doses of aspirin every day.  Taking vitamin and mineral supplements as recommended by your health care provider. What happens during an annual well check? The services and screenings done by your health care provider during your annual well check will depend on your age, overall health, lifestyle risk factors, and family history of disease. Counseling  Your health care provider may ask you questions about your:  Alcohol use.  Tobacco use.  Drug use.  Emotional  well-being.  Home and relationship well-being.  Sexual activity.  Eating habits.  History of falls.  Memory and ability to understand (cognition).  Work and work Statistician. Screening  You may have the following tests or measurements:  Height, weight, and BMI.  Blood pressure.  Lipid and cholesterol levels. These may be checked every 5 years, or more frequently if you are over 55 years old.  Skin check.  Lung cancer screening. You may have this screening every year starting at age 33 if you have a 30-pack-year history of smoking and currently smoke or have quit within the past 15 years.  Fecal occult blood test (FOBT) of the stool. You may have this test every year starting at age 14.  Flexible sigmoidoscopy or colonoscopy. You may have a sigmoidoscopy every 5 years or a colonoscopy every 10 years starting at age 54.  Prostate cancer screening. Recommendations will vary depending on your family history and other risks.  Hepatitis C blood test.  Hepatitis B blood test.  Sexually transmitted disease (STD) testing.  Diabetes screening. This is done by checking your blood sugar (glucose) after you have not eaten for a while (fasting). You may have this done every 1-3 years.  Abdominal aortic aneurysm (AAA) screening. You may need this if you are a current or former smoker.  Osteoporosis. You may be screened starting at age 73 if you are at high risk. Talk with your health care provider about your test results, treatment options, and if necessary, the need for more tests. Vaccines  Your health care provider may recommend certain vaccines, such as:  Influenza vaccine. This is  recommended every year.  Tetanus, diphtheria, and acellular pertussis (Tdap, Td) vaccine. You may need a Td booster every 10 years.  Zoster vaccine. You may need this after age 73.  Pneumococcal 13-valent conjugate (PCV13) vaccine. One dose is recommended after age 45.  Pneumococcal  polysaccharide (PPSV23) vaccine. One dose is recommended after age 38. Talk to your health care provider about which screenings and vaccines you need and how often you need them. This information is not intended to replace advice given to you by your health care provider. Make sure you discuss any questions you have with your health care provider. Document Released: 06/25/2015 Document Revised: 02/16/2016 Document Reviewed: 03/30/2015 Elsevier Interactive Patient Education  2017 [employer] Prevention in the Home Falls can cause injuries. They can happen to people of all ages. There are many things you can do to make your home safe and to help prevent falls. What can I do on the outside of my home?  Regularly fix the edges of walkways and driveways and fix any cracks.  Remove anything that might make you trip as you walk through a door, such as a raised step or threshold.  Trim any bushes or trees on the path to your home.  Use bright outdoor lighting.  Clear any walking paths of anything that might make someone trip, such as rocks or tools.  Regularly check to see if handrails are loose or broken. Make sure that both sides of any steps have handrails.  Any raised decks and porches should have guardrails on the edges.  Have any leaves, snow, or ice cleared regularly.  Use sand or salt on walking paths during winter.  Clean up any spills in your garage right away. This includes oil or grease spills. What can I do in the bathroom?  Use night lights.  Install grab bars by the toilet and in the tub and shower. Do not use towel bars as grab bars.  Use non-skid mats or decals in the tub or shower.  If you need to sit down in the shower, use a plastic, non-slip stool.  Keep the floor dry. Clean up any water that spills on the floor as soon as it happens.  Remove soap buildup in the tub or shower regularly.  Attach bath mats securely with double-sided non-slip rug  tape.  Do not have throw rugs and other things on the floor that can make you trip. What can I do in the bedroom?  Use night lights.  Make sure that you have a light by your bed that is easy to reach.  Do not use any sheets or blankets that are too big for your bed. They should not hang down onto the floor.  Have a firm chair that has side arms. You can use this for support while you get dressed.  Do not have throw rugs and other things on the floor that can make you trip. What can I do in the kitchen?  Clean up any spills right away.  Avoid walking on wet floors.  Keep items that you use a lot in easy-to-reach places.  If you need to reach something above you, use a strong step stool that has a grab bar.  Keep electrical cords out of the way.  Do not use floor polish or wax that makes floors slippery. If you must use wax, use non-skid floor wax.  Do not have throw rugs and other things on the floor that can make you trip.  What can I do with my stairs?  Do not leave any items on the stairs.  Make sure that there are handrails on both sides of the stairs and use them. Fix handrails that are broken or loose. Make sure that handrails are as long as the stairways.  Check any carpeting to make sure that it is firmly attached to the stairs. Fix any carpet that is loose or worn.  Avoid having throw rugs at the top or bottom of the stairs. If you do have throw rugs, attach them to the floor with carpet tape.  Make sure that you have a light switch at the top of the stairs and the bottom of the stairs. If you do not have them, ask someone to add them for you. What else can I do to help prevent falls?  Wear shoes that:  Do not have high heels.  Have rubber bottoms.  Are comfortable and fit you well.  Are closed at the toe. Do not wear sandals.  If you use a stepladder:  Make sure that it is fully opened. Do not climb a closed stepladder.  Make sure that both sides of the  stepladder are locked into place.  Ask someone to hold it for you, if possible.  Clearly mark and make sure that you can see:  Any grab bars or handrails.  First and last steps.  Where the edge of each step is.  Use tools that help you move around (mobility aids) if they are needed. These include:  Canes.  Walkers.  Scooters.  Crutches.  Turn on the lights when you go into a dark area. Replace any light bulbs as soon as they burn out.  Set up your furniture so you have a clear path. Avoid moving your furniture around.  If any of your floors are uneven, fix them.  If there are any pets around you, be aware of where they are.  Review your medicines with your doctor. Some medicines can make you feel dizzy. This can increase your chance of falling. Ask your doctor what other things that you can do to help prevent falls. This information is not intended to replace advice given to you by your health care provider. Make sure you discuss any questions you have with your health care provider. Document Released: 03/25/2009 Document Revised: 11/04/2015 Document Reviewed: 07/03/2014 Elsevier Interactive Patient Education  2017 [employer].

## 2020-04-29 DIAGNOSIS — Z9989 Dependence on other enabling machines and devices: Secondary | ICD-10-CM | POA: Insufficient documentation

## 2020-04-29 DIAGNOSIS — G4733 Obstructive sleep apnea (adult) (pediatric): Secondary | ICD-10-CM

## 2020-04-29 HISTORY — DX: Obstructive sleep apnea (adult) (pediatric): G47.33

## 2020-06-16 ENCOUNTER — Other Ambulatory Visit: Payer: Self-pay

## 2020-06-16 ENCOUNTER — Encounter: Payer: Self-pay | Admitting: Nurse Practitioner

## 2020-06-16 ENCOUNTER — Ambulatory Visit (INDEPENDENT_AMBULATORY_CARE_PROVIDER_SITE_OTHER): Payer: Medicare Other | Admitting: Nurse Practitioner

## 2020-06-16 VITALS — BP 118/68 | HR 72 | Temp 96.9°F | Ht 67.0 in | Wt 121.8 lb

## 2020-06-16 DIAGNOSIS — F0390 Unspecified dementia without behavioral disturbance: Secondary | ICD-10-CM

## 2020-06-16 DIAGNOSIS — D649 Anemia, unspecified: Secondary | ICD-10-CM | POA: Diagnosis not present

## 2020-06-16 DIAGNOSIS — G4733 Obstructive sleep apnea (adult) (pediatric): Secondary | ICD-10-CM

## 2020-06-16 DIAGNOSIS — N1832 Chronic kidney disease, stage 3b: Secondary | ICD-10-CM

## 2020-06-16 DIAGNOSIS — R339 Retention of urine, unspecified: Secondary | ICD-10-CM

## 2020-06-16 DIAGNOSIS — T83511S Infection and inflammatory reaction due to indwelling urethral catheter, sequela: Secondary | ICD-10-CM

## 2020-06-16 DIAGNOSIS — N390 Urinary tract infection, site not specified: Secondary | ICD-10-CM

## 2020-06-16 DIAGNOSIS — R5381 Other malaise: Secondary | ICD-10-CM

## 2020-06-16 DIAGNOSIS — R634 Abnormal weight loss: Secondary | ICD-10-CM

## 2020-06-16 DIAGNOSIS — I4510 Unspecified right bundle-branch block: Secondary | ICD-10-CM

## 2020-06-16 LAB — BASIC METABOLIC PANEL WITH GFR
BUN/Creatinine Ratio: 15 (calc) (ref 6–22)
BUN: 20 mg/dL (ref 7–25)
CO2: 30 mmol/L (ref 20–32)
Calcium: 9.6 mg/dL (ref 8.6–10.3)
Chloride: 102 mmol/L (ref 98–110)
Creat: 1.34 mg/dL — ABNORMAL HIGH (ref 0.70–1.11)
GFR, Est African American: 53 mL/min/{1.73_m2} — ABNORMAL LOW (ref >=60)
GFR, Est Non African American: 46 mL/min/{1.73_m2} — ABNORMAL LOW (ref >=60)
Glucose, Bld: 79 mg/dL (ref 65–99)
Potassium: 4.3 mmol/L (ref 3.5–5.3)
Sodium: 138 mmol/L (ref 135–146)

## 2020-06-16 LAB — CBC WITH DIFFERENTIAL/PLATELET
Absolute Monocytes: 349 cells/uL (ref 200–950)
Basophils Absolute: 29 cells/uL (ref 0–200)
Basophils Relative: 0.7 %
Eosinophils Absolute: 210 cells/uL (ref 15–500)
Eosinophils Relative: 5 %
HCT: 39 % (ref 38.5–50.0)
Hemoglobin: 12.9 g/dL — ABNORMAL LOW (ref 13.2–17.1)
Lymphs Abs: 1966 cells/uL (ref 850–3900)
MCH: 30.4 pg (ref 27.0–33.0)
MCHC: 33.1 g/dL (ref 32.0–36.0)
MCV: 91.8 fL (ref 80.0–100.0)
MPV: 10.6 fL (ref 7.5–12.5)
Monocytes Relative: 8.3 %
Neutro Abs: 1646 cells/uL (ref 1500–7800)
Neutrophils Relative %: 39.2 %
Platelets: 189 10*3/uL (ref 140–400)
RBC: 4.25 10*6/uL (ref 4.20–5.80)
RDW: 12.3 % (ref 11.0–15.0)
Total Lymphocyte: 46.8 %
WBC: 4.2 10*3/uL (ref 3.8–10.8)

## 2020-06-16 NOTE — Patient Instructions (Addendum)
Look up foods high in iron- Legumes, spinach. Seafood  We will get labs today  Continue to encourage proper nutrition and hydration with multiple meals a day and pushing fluids.

## 2020-06-16 NOTE — Progress Notes (Signed)
Careteam: [name], NP as PCP - General (Geriatric Medicine) [name], MD as Consulting Physician (Ophthalmology) [name], MD as Consulting Physician (Neurology) [name], MD as Consulting Physician (Pulmonary Disease)  PLACE OF SERVICE:  [facility] Directive information Does Patient Have a Medical Advance Directive?: Yes, Type of Advance Directive: Out of facility DNR (pink MOST or yellow form), Pre-existing out of facility DNR order (yellow form or pink MOST form): Pink MOST form placed in chart (order not valid for inpatient use);Yellow form placed in chart (order not valid for inpatient use), Does patient want to make changes to medical advance directive?: No - Patient declined  No Known Allergies  Chief Complaint  Patient presents with  . Medical Management of Chronic Issues    4 month follow. Patient phases out at time. Patient was dx with sleep apnea, yet unable tot get a CPAP machine due to recall, company told patient he is on the list. Patient with 2 UTI's within the last 3 weeks (seen Urologist). Here with wife, granddaughter assisted patient to room as he could not walk on his own.      HPI: Patient is a 87 y.o. male for follow up.   Wife reports more passing out episodes- notices it when he has UTIs and he has had them back to back recently.  He had to have extended course of antibiotic because UTI did not clear up.  Has completed 2nd round of antibiotic and doing much better now.  No constipation or diarrhea.   He has been diagnosised with CPAP- supply and demand of supplies is overwhelming and they are on waiting list for CPAP machine.   Having more episode of "blanking out" went to hospital last year when this happened and was found to be dehydrated so they are trying to keep fluid in him.   Weight loss- continues to lose weight. Had gained weight to 125 lbs now down to 121 lbs. Appetite has  diminished. Consistently eating throughout the day- doing 3-4 smaller meals. Liberalized diet.      Review of Systems:  Review of Systems  Unable to perform ROS: Dementia    Past Medical History:  Diagnosis Date  . BPH (benign prostatic hypertrophy)   . Cataract    Both  . Dementia ([place])   . Diverticula of colon   . Elevated lactic acid level   . Gallbladder polyp   . Glaucoma 1986   Dr. Mila Merry  . Gram negative sepsis ([place])   . Hearing loss   . Hx of adenomatous colonic polyps   . Internal hemorrhoids   . Left varicocele   . Liver cyst   . Obesity   . Pseudodementia   . RBBB (right bundle branch block)   . Urine retention   . UTI (urinary tract infection)    Past Surgical History:  Procedure Laterality Date  . CATARACT EXTRACTION Right   . GLAUCOMA REPAIR Left   . KNEE SURGERY    . TRANSURETHRAL RESECTION OF PROSTATE  07/16/2018   [facility]   Social History:   reports that he quit smoking about 65 years ago. His smoking use included cigarettes. He has a 3.00 pack-year smoking history. He has never used smokeless tobacco. He reports that he does not drink alcohol and does not use drugs.  Family History  Problem Relation Age of Onset  . Heart disease Father 32  . Parkinson's disease  Father   . Cancer Mother        ? type  . Colon cancer Neg Hx     Medications: Patient's Medications  New Prescriptions   No medications on file  Previous Medications   ACETAMINOPHEN (TYLENOL) 500 MG TABLET    Take 500 mg by mouth as needed.   ASPIRIN EC 81 MG TABLET    Take 81 mg by mouth daily.   BRIMONIDINE (ALPHAGAN) 0.2 % OPHTHALMIC SOLUTION    Place 1 drop into the left eye Twice daily.    CETIRIZINE (ZYRTEC) 10 MG TABLET    Take 10 mg by mouth as needed for allergies.   CLOTRIMAZOLE (LOTRIMIN) 1 % CREAM    Apply 1 application topically 2 (two) times daily as needed.   DORZOLAMIDE-TIMOLOL (COSOPT) 22.3-6.8 MG/ML OPHTHALMIC SOLUTION    Place 1 drop into the left  eye 2 (two) times daily.   LORATADINE (CLARITIN) 10 MG TABLET    Take 1 tablet (10 mg total) by mouth daily as needed for allergies.   MULTIPLE VITAMIN (MULTIVITAMIN) TABLET    Take 1 tablet by mouth daily.   NYSTATIN (MYCOSTATIN/NYSTOP) POWDER    Apply topically 3 (three) times daily.   POLYETHYLENE GLYCOL (MIRALAX / GLYCOLAX) 17 G PACKET    Take 17 g by mouth daily.   SACCHAROMYCES BOULARDII (FLORASTOR BABY) 250 MG PACK    Take 250 mg by mouth daily.  Modified Medications   No medications on file  Discontinued Medications   No medications on file    Physical Exam:  Vitals:   06/16/20 1033  BP: 118/68  Pulse: 72  Temp: (!) 96.9 F (36.1 C)  TempSrc: Temporal  Weight: 121 lb 12.8 oz (55.2 kg)  Height: 5' 7" (1.702 m)   Body mass index is 19.08 kg/m. Wt Readings from Last 3 Encounters:  06/16/20 121 lb 12.8 oz (55.2 kg)  02/20/20 125 lb (56.7 kg)  02/11/20 128 lb (58.1 kg)    Physical Exam Constitutional:      General: He is not in acute distress.    Appearance: He is well-developed and well-nourished. He is not diaphoretic.  HENT:     Head: Normocephalic and atraumatic.     Mouth/Throat:     Mouth: Oropharynx is clear and moist. Mucous membranes are moist.     Pharynx: No oropharyngeal exudate.  Eyes:     Extraocular Movements: EOM normal.     Conjunctiva/sclera: Conjunctivae normal.     Pupils: Pupils are equal, round, and reactive to light.  Cardiovascular:     Rate and Rhythm: Normal rate and regular rhythm.     Heart sounds: Normal heart sounds.  Pulmonary:     Effort: Pulmonary effort is normal.     Breath sounds: Normal breath sounds.  Abdominal:     General: Bowel sounds are normal.     Palpations: Abdomen is soft.  Musculoskeletal:        General: No tenderness.     Cervical back: Normal range of motion and neck supple.     Right lower leg: No edema.     Left lower leg: No edema.  Skin:    General: Skin is warm and dry.  Neurological:     Mental  Status: He is alert.     Gait: Gait abnormal.  Psychiatric:        Mood and Affect: Mood and affect normal.    Labs reviewed: Basic Metabolic Panel: Recent Labs  07/30/19 1151 11/14/19 1353 03/04/20 1050  NA 139 140 138  K 4.6 4.1 4.2  CL 101 104 102  CO2 _0 GLUCOSE 83 87 84  BUN _1 CREATININE 1.35* 1.26* 1.32*  CALCIUM 10.3 9.2 9.6  TSH 3.86  --   --    Liver Function Tests: Recent Labs    07/30/19 1151 03/04/20 1050  AST 23 19  ALT 14 11  BILITOT 0.6 0.5  PROT 7.9 7.2   No results for input(s): LIPASE, AMYLASE in the last 8760 hours. No results for input(s): AMMONIA in the last 8760 hours. CBC: Recent Labs    07/30/19 1151 11/14/19 1353 03/04/20 1050  WBC 4.2 5.5 3.8  NEUTROABS 1,709 2,420 1,615  HGB 15.0 12.5* 13.2  HCT 44.5 37.1* 39.4  MCV 93.7 92.1 92.9  PLT 227 204 139*   Lipid Panel: No results for input(s): CHOL, HDL, LDLCALC, TRIG, CHOLHDL, LDLDIRECT in the last 8760 hours. TSH: Recent Labs    07/30/19 1151  TSH 3.86   A1C: No results found for: HGBA1C   Assessment/Plan 1. Stage 3b chronic kidney disease ([place]) -family pushes hydration. Avoiding nsaids - BMP with eGFR([facility])  2. Dementia without behavioral disturbance, unspecified dementia type ([place]) -progressive decline. Family helps bath and dress him. They will feed him as well but do promote him to do as much as he can.  He has lost weight and likely will continue to lose more with progression of dementia.  -continue supportive care  - Ambulatory referral to [facility]  3. Anemia, unspecified type -off iron supplement, encouraged to add foods rich in iron to diet.  - CBC with Differential/Platelet  4. Urinary tract infection associated with indwelling urethral catheter, sequela -followed by urology, has completed 2 courses of antibiotics and seems to to be doing better at this time. Continues to be weak with increase in debility.  - Ambulatory referral to [facility] for further evaluation and treatment.  5. Debility He does not like to walk, fear of falling per wife. He does not use walker which I encouraged wife to have him use to prevent fall. He would likely benefit from wheelchair.  - Ambulatory referral to [facility]  6. Urinary retention Continues with chronic foley catheter followed by urology  7. OSA (obstructive sleep apnea) Awaiting CPAP  8. BUNDLE BRANCH BLOCK, RIGHT Followed by cardiology, wife reports "syncopal" episodes related to UTI, encouraged to follow up with cardiology sooner (has appt in 2 months) if symptoms persist now that UTI has resolved.   9. Weight loss Due to progressive dementia, likely will worsen as disease progresses however he has had 2 courses of antibiotics and this has likely effected appetite. Family continues to encourage and help pt with meals.   Next appt: 4 months.  [name]. [facility], [facility] [phone]

## 2020-06-25 DIAGNOSIS — Z029 Encounter for administrative examinations, unspecified: Secondary | ICD-10-CM

## 2020-07-01 ENCOUNTER — Telehealth: Payer: Self-pay | Admitting: *Deleted

## 2020-07-01 NOTE — Telephone Encounter (Signed)
Received FMLA Paperwork from [employer], daughter, for [employer]  Paperwork filled out, copy sent for scanning and Original left up front for pick up.  Daughter is aware of form and charge of $25 and will pick up today.

## 2020-07-14 ENCOUNTER — Telehealth: Payer: Self-pay | Admitting: *Deleted

## 2020-07-14 NOTE — Telephone Encounter (Signed)
Radovan with [facility] called requesting verbal orders for 1x5wks OT.  Verbal orders given.

## 2020-07-20 ENCOUNTER — Telehealth: Payer: Self-pay

## 2020-07-20 NOTE — Telephone Encounter (Signed)
Spoke with patients wife and relayed response from [name], NP . Mrs.Haddix agrees and plans to discuss with pulmonologist

## 2020-07-20 NOTE — Telephone Encounter (Signed)
Would have them discuss this with the pulmonary doctors.

## 2020-07-20 NOTE — Telephone Encounter (Signed)
Incoming call received from Physical Therapist requesting verbal orders to continue PT, 2 times a week for 3 week then 1 time a week for 1 week. Verbal order was giver per standing [facility] protocol.  Charrice (physial therapist) also stated:   1.) Patient is still without CPAP machine, waiting since November to receive from [facility]. Patient is on a waiting list with no date of when this equipment will be available for him. Patients wife is asking if [name] will order through another company?   2.) Patients hands are always cold and she is unable to get an oxygen level on patient. Patients wife is asking if patient can be started on oxygen?  Please advise

## 2020-07-20 NOTE — Telephone Encounter (Signed)
FYI patient has a pending appointment with the lung specialist on 07/27/2020

## 2020-08-09 ENCOUNTER — Other Ambulatory Visit: Payer: Self-pay

## 2020-08-09 DIAGNOSIS — R339 Retention of urine, unspecified: Secondary | ICD-10-CM | POA: Insufficient documentation

## 2020-08-09 DIAGNOSIS — F03918 Unspecified dementia, unspecified severity, with other behavioral disturbance: Secondary | ICD-10-CM | POA: Insufficient documentation

## 2020-08-09 DIAGNOSIS — R4189 Other symptoms and signs involving cognitive functions and awareness: Secondary | ICD-10-CM | POA: Insufficient documentation

## 2020-08-09 DIAGNOSIS — Z860101 Personal history of adenomatous and serrated colon polyps: Secondary | ICD-10-CM | POA: Insufficient documentation

## 2020-08-09 DIAGNOSIS — Z86010 Personal history of colonic polyps: Secondary | ICD-10-CM | POA: Insufficient documentation

## 2020-08-09 DIAGNOSIS — K824 Cholesterolosis of gallbladder: Secondary | ICD-10-CM | POA: Insufficient documentation

## 2020-08-09 DIAGNOSIS — H269 Unspecified cataract: Secondary | ICD-10-CM | POA: Insufficient documentation

## 2020-08-09 DIAGNOSIS — E669 Obesity, unspecified: Secondary | ICD-10-CM | POA: Insufficient documentation

## 2020-08-09 DIAGNOSIS — R2689 Other abnormalities of gait and mobility: Secondary | ICD-10-CM

## 2020-08-09 DIAGNOSIS — I4510 Unspecified right bundle-branch block: Secondary | ICD-10-CM | POA: Insufficient documentation

## 2020-08-09 DIAGNOSIS — F0390 Unspecified dementia without behavioral disturbance: Secondary | ICD-10-CM | POA: Insufficient documentation

## 2020-08-09 DIAGNOSIS — K648 Other hemorrhoids: Secondary | ICD-10-CM | POA: Insufficient documentation

## 2020-08-09 DIAGNOSIS — A4150 Gram-negative sepsis, unspecified: Secondary | ICD-10-CM | POA: Insufficient documentation

## 2020-08-09 DIAGNOSIS — R7989 Other specified abnormal findings of blood chemistry: Secondary | ICD-10-CM | POA: Insufficient documentation

## 2020-08-09 DIAGNOSIS — N390 Urinary tract infection, site not specified: Secondary | ICD-10-CM | POA: Insufficient documentation

## 2020-08-09 DIAGNOSIS — I861 Scrotal varices: Secondary | ICD-10-CM | POA: Insufficient documentation

## 2020-08-09 HISTORY — DX: Other abnormalities of gait and mobility: R26.89

## 2020-08-16 ENCOUNTER — Other Ambulatory Visit: Payer: Self-pay

## 2020-08-16 ENCOUNTER — Ambulatory Visit: Payer: Medicare Other | Admitting: Cardiology

## 2020-08-16 ENCOUNTER — Encounter: Payer: Self-pay | Admitting: Cardiology

## 2020-08-16 VITALS — BP 94/60 | HR 50 | Ht 67.0 in | Wt 125.0 lb

## 2020-08-16 DIAGNOSIS — Z9989 Dependence on other enabling machines and devices: Secondary | ICD-10-CM

## 2020-08-16 DIAGNOSIS — I4510 Unspecified right bundle-branch block: Secondary | ICD-10-CM

## 2020-08-16 DIAGNOSIS — I10 Essential (primary) hypertension: Secondary | ICD-10-CM

## 2020-08-16 DIAGNOSIS — I959 Hypotension, unspecified: Secondary | ICD-10-CM | POA: Diagnosis not present

## 2020-08-16 DIAGNOSIS — G4733 Obstructive sleep apnea (adult) (pediatric): Secondary | ICD-10-CM | POA: Diagnosis not present
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
## 2020-08-16 NOTE — Patient Instructions (Signed)
Medication Instructions:  Your physician recommends that you continue on your current medications as directed. Please refer to the Current Medication list given to you today.  *If you need a refill on your cardiac medications before your next appointment, please call your pharmacy*   Lab Work: None.  If you have labs (blood work) drawn today and your tests are completely normal, you will receive your results only by: . MyChart Message (if you have MyChart) OR . A paper copy in the mail If you have any lab test that is abnormal or we need to change your treatment, we will call you to review the results.   Testing/Procedures: Your physician has requested that you have an echocardiogram. Echocardiography is a painless test that uses sound waves to create images of your heart. It provides your doctor with information about the size and shape of your heart and how well your heart's chambers and valves are working. This procedure takes approximately one hour. There are no restrictions for this procedure.     Follow-Up: At [facility], you and your health needs are our priority.  As part of our continuing mission to provide you with exceptional heart care, we have created designated Provider Care Teams.  These Care Teams include your primary Cardiologist (physician) and Advanced Practice Providers (APPs -  Physician Assistants and Nurse Practitioners) who all work together to provide you with the care you need, when you need it.  We recommend signing up for the patient portal called "MyChart".  Sign up information is provided on this After Visit Summary.  MyChart is used to connect with patients for Virtual Visits (Telemedicine).  Patients are able to view lab/test results, encounter notes, upcoming appointments, etc.  Non-urgent messages can be sent to your provider as well.   To learn more about what you can do with MyChart, go to [email or web address]://[email or web address].com.    Your next appointment:   3  month(s)  The format for your next appointment:   In Person  Provider:   Robert Krasowski, MD   Other Instructions   

## 2020-08-17 ENCOUNTER — Telehealth: Payer: Self-pay

## 2020-08-17 NOTE — Telephone Encounter (Signed)
Noted thank you for the update

## 2020-08-17 NOTE — Telephone Encounter (Signed)
Incoming call received from [facility] stating patient will be discharged from home health physical therapy today  - Patient did NOT meet expected goals - Patient finally got CPAP machine, he is having trouble adjusting settings - Patient more combative, was incapable of participating in several session due to sleeping and dementia - Patient will see Neuro next week

## 2020-08-24 ENCOUNTER — Ambulatory Visit (INDEPENDENT_AMBULATORY_CARE_PROVIDER_SITE_OTHER): Payer: Medicare Other | Admitting: Neurology

## 2020-08-24 ENCOUNTER — Encounter: Payer: Self-pay | Admitting: Neurology

## 2020-08-24 ENCOUNTER — Other Ambulatory Visit: Payer: Self-pay

## 2020-08-24 VITALS — BP 102/66 | HR 73 | Ht 67.0 in | Wt 102.0 lb

## 2020-08-24 DIAGNOSIS — F0390 Unspecified dementia without behavioral disturbance: Secondary | ICD-10-CM | POA: Diagnosis not present

## 2020-08-24 DIAGNOSIS — F03B0 Unspecified dementia, moderate, without behavioral disturbance, psychotic disturbance, mood disturbance, and anxiety: Secondary | ICD-10-CM
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
## 2020-08-24 NOTE — Patient Instructions (Signed)
Always good to see you. Continue 24/7 care. Follow-up in 6 months, call for any changes

## 2020-09-03 ENCOUNTER — Other Ambulatory Visit: Payer: Self-pay

## 2020-09-03 ENCOUNTER — Ambulatory Visit (HOSPITAL_BASED_OUTPATIENT_CLINIC_OR_DEPARTMENT_OTHER)
Admission: RE | Admit: 2020-09-03 | Discharge: 2020-09-03 | Disposition: A | Discharge status: Home / Self Care | Payer: Medicare Other | Source: Ambulatory Visit | Attending: Cardiology | Admitting: Cardiology

## 2020-09-03 DIAGNOSIS — I959 Hypotension, unspecified: Secondary | ICD-10-CM | POA: Insufficient documentation

## 2020-09-03 LAB — ECHOCARDIOGRAM COMPLETE
Area-P 1/2: 2.63 cm2
S' Lateral: 2.26 cm

## 2020-09-17 ENCOUNTER — Telehealth: Payer: Self-pay | Admitting: *Deleted

## 2020-09-17 NOTE — Telephone Encounter (Signed)
Transition Care Management Follow-Up Telephone Call   Date discharged and where:Admit date: 09/14/2020  Discharge date: 09/16/2020   Discharge Service: [facility] Hospitalist    How have you been since you were released from the hospital?  Wife stated that he is getting better slowly   Any patient concerns? No  Items Reviewed:   Meds:  Yes  Allergies:Yes  Dietary Changes Reviewed:Yes  Functional Questionnaire:  Independent-I Dependent-D  ADLs:I uses Engineer, manufacturing systems. Home Health   Dressing- I    Eating-I   Maintaining continence-I   Transferring-I with assistance   Transportation-D   Meal Prep-D   Managing Meds- I   Confirmed importance and Date/Time of follow-up visits scheduled: Appointment 09/21/20 with Dinah   Confirmed with patient if condition worsens to call PCP or go to the Emergency Dept. Patient was given office number and encouraged to call back with questions or concerns: Yes

## 2020-09-21 ENCOUNTER — Telehealth: Payer: Self-pay | Admitting: Nurse Practitioner

## 2020-09-21 ENCOUNTER — Encounter: Payer: Self-pay | Admitting: Family

## 2020-09-21 ENCOUNTER — Ambulatory Visit: Payer: Medicare Other | Admitting: Family

## 2020-09-21 ENCOUNTER — Other Ambulatory Visit: Payer: Self-pay

## 2020-09-21 VITALS — BP 120/70 | HR 69 | Temp 98.1°F | Resp 20 | Ht 67.0 in | Wt 119.8 lb

## 2020-09-21 DIAGNOSIS — F0390 Unspecified dementia without behavioral disturbance: Secondary | ICD-10-CM

## 2020-09-21 DIAGNOSIS — T83511S Infection and inflammatory reaction due to indwelling urethral catheter, sequela: Secondary | ICD-10-CM | POA: Diagnosis not present

## 2020-09-21 DIAGNOSIS — G4733 Obstructive sleep apnea (adult) (pediatric): Secondary | ICD-10-CM | POA: Diagnosis not present

## 2020-09-21 DIAGNOSIS — D649 Anemia, unspecified: Secondary | ICD-10-CM | POA: Diagnosis not present

## 2020-09-21 DIAGNOSIS — N390 Urinary tract infection, site not specified: Secondary | ICD-10-CM

## 2020-09-21 NOTE — Telephone Encounter (Signed)
Yes, needs to make sure he has a follow up visit post hospitalization

## 2020-09-21 NOTE — Patient Instructions (Signed)
-   blood work done today will call you with results.

## 2020-09-21 NOTE — Telephone Encounter (Signed)
Tanya with [facility] called to clarify that [name] would be attending PCP for Mr Ketchum & that [facility] for PT eval would be 09/27/20  Thanks,  Vilinda Blanks

## 2020-09-22 LAB — CBC WITH DIFFERENTIAL/PLATELET
Absolute Monocytes: 437 cells/uL (ref 200–950)
Basophils Absolute: 29 cells/uL (ref 0–200)
Basophils Relative: 0.7 %
Eosinophils Absolute: 197 cells/uL (ref 15–500)
Eosinophils Relative: 4.7 %
HCT: 36 % — ABNORMAL LOW (ref 38.5–50.0)
Hemoglobin: 12.2 g/dL — ABNORMAL LOW (ref 13.2–17.1)
Lymphs Abs: 1554 cells/uL (ref 850–3900)
MCH: 31.4 pg (ref 27.0–33.0)
MCHC: 33.9 g/dL (ref 32.0–36.0)
MCV: 92.8 fL (ref 80.0–100.0)
MPV: 10.6 fL (ref 7.5–12.5)
Monocytes Relative: 10.4 %
Neutro Abs: 1982 cells/uL (ref 1500–7800)
Neutrophils Relative %: 47.2 %
Platelets: 180 10*3/uL (ref 140–400)
RBC: 3.88 10*6/uL — ABNORMAL LOW (ref 4.20–5.80)
RDW: 12.5 % (ref 11.0–15.0)
Total Lymphocyte: 37 %
WBC: 4.2 10*3/uL (ref 3.8–10.8)

## 2020-09-22 LAB — BASIC METABOLIC PANEL WITH GFR
BUN/Creatinine Ratio: 18 (calc) (ref 6–22)
BUN: 21 mg/dL (ref 7–25)
CO2: 28 mmol/L (ref 20–32)
Calcium: 9.5 mg/dL (ref 8.6–10.3)
Chloride: 102 mmol/L (ref 98–110)
Creat: 1.18 mg/dL — ABNORMAL HIGH (ref 0.70–1.11)
GFR, Est African American: 62 mL/min/{1.73_m2} (ref >=60)
GFR, Est Non African American: 53 mL/min/{1.73_m2} — ABNORMAL LOW (ref >=60)
Glucose, Bld: 103 mg/dL (ref 65–139)
Potassium: 3.7 mmol/L (ref 3.5–5.3)
Sodium: 139 mmol/L (ref 135–146)
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
## 2020-10-13 ENCOUNTER — Ambulatory Visit: Payer: Medicare Other | Admitting: Nurse Practitioner

## 2020-10-15 ENCOUNTER — Other Ambulatory Visit: Payer: Self-pay

## 2020-10-15 ENCOUNTER — Ambulatory Visit (INDEPENDENT_AMBULATORY_CARE_PROVIDER_SITE_OTHER): Payer: Medicare Other | Admitting: Nurse Practitioner

## 2020-10-15 ENCOUNTER — Encounter: Payer: Self-pay | Admitting: Nurse Practitioner

## 2020-10-15 VITALS — BP 96/60 | HR 65 | Temp 96.9°F | Ht 67.0 in | Wt 122.0 lb

## 2020-10-15 DIAGNOSIS — R339 Retention of urine, unspecified: Secondary | ICD-10-CM

## 2020-10-15 DIAGNOSIS — F0390 Unspecified dementia without behavioral disturbance: Secondary | ICD-10-CM

## 2020-10-15 DIAGNOSIS — G4733 Obstructive sleep apnea (adult) (pediatric): Secondary | ICD-10-CM | POA: Diagnosis not present

## 2020-10-15 DIAGNOSIS — D649 Anemia, unspecified: Secondary | ICD-10-CM | POA: Diagnosis not present

## 2020-10-15 DIAGNOSIS — N1832 Chronic kidney disease, stage 3b: Secondary | ICD-10-CM | POA: Diagnosis not present

## 2020-10-15 DIAGNOSIS — I959 Hypotension, unspecified: Secondary | ICD-10-CM
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
## 2020-10-15 NOTE — Patient Instructions (Addendum)
Use compression hose daily- place in the morning and take off at bedtime.   Recommend 4th COVID shot

## 2020-11-16 DIAGNOSIS — I861 Scrotal varices: Secondary | ICD-10-CM | POA: Insufficient documentation

## 2020-11-16 DIAGNOSIS — G4730 Sleep apnea, unspecified: Secondary | ICD-10-CM | POA: Insufficient documentation

## 2020-11-23 ENCOUNTER — Ambulatory Visit: Payer: Medicare Other | Admitting: Cardiology

## 2020-11-23 ENCOUNTER — Encounter: Payer: Self-pay | Admitting: Cardiology

## 2020-11-23 ENCOUNTER — Other Ambulatory Visit: Payer: Self-pay

## 2020-11-23 VITALS — BP 100/66 | HR 60 | Ht 67.0 in | Wt 122.0 lb

## 2020-11-23 DIAGNOSIS — I4510 Unspecified right bundle-branch block: Secondary | ICD-10-CM

## 2020-11-23 DIAGNOSIS — G4733 Obstructive sleep apnea (adult) (pediatric): Secondary | ICD-10-CM

## 2020-11-23 DIAGNOSIS — E781 Pure hyperglyceridemia: Secondary | ICD-10-CM | POA: Diagnosis not present

## 2020-11-23 DIAGNOSIS — G309 Alzheimer's disease, unspecified: Secondary | ICD-10-CM

## 2020-11-23 DIAGNOSIS — F0280 Dementia in other diseases classified elsewhere without behavioral disturbance: Secondary | ICD-10-CM
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
## 2020-11-23 NOTE — Patient Instructions (Signed)

## 2021-02-08 ENCOUNTER — Encounter: Payer: Self-pay | Admitting: Nurse Practitioner

## 2021-02-21 ENCOUNTER — Encounter: Payer: Self-pay | Admitting: Nurse Practitioner

## 2021-02-21 ENCOUNTER — Other Ambulatory Visit: Payer: Self-pay

## 2021-02-21 ENCOUNTER — Ambulatory Visit: Payer: Medicare Other | Admitting: Nurse Practitioner

## 2021-02-21 VITALS — BP 138/74 | HR 63 | Temp 97.9°F | Ht 67.0 in | Wt 120.6 lb

## 2021-02-21 DIAGNOSIS — D649 Anemia, unspecified: Secondary | ICD-10-CM

## 2021-02-21 DIAGNOSIS — F0390 Unspecified dementia without behavioral disturbance: Secondary | ICD-10-CM | POA: Diagnosis not present

## 2021-02-21 DIAGNOSIS — J302 Other seasonal allergic rhinitis: Secondary | ICD-10-CM

## 2021-02-21 DIAGNOSIS — N1832 Chronic kidney disease, stage 3b: Secondary | ICD-10-CM

## 2021-02-21 DIAGNOSIS — B372 Candidiasis of skin and nail: Secondary | ICD-10-CM

## 2021-02-21 DIAGNOSIS — Z23 Encounter for immunization: Secondary | ICD-10-CM

## 2021-02-21 DIAGNOSIS — R339 Retention of urine, unspecified: Secondary | ICD-10-CM

## 2021-02-21 DIAGNOSIS — R5381 Other malaise: Secondary | ICD-10-CM

## 2021-02-21 LAB — COMPLETE METABOLIC PANEL WITH GFR
AG Ratio: 1.2 (calc) (ref 1.0–2.5)
ALT: 11 U/L (ref 9–46)
AST: 22 U/L (ref 10–35)
Albumin: 3.7 g/dL (ref 3.6–5.1)
Alkaline phosphatase (APISO): 161 U/L — ABNORMAL HIGH (ref 35–144)
BUN: 19 mg/dL (ref 7–25)
CO2: 30 mmol/L (ref 20–32)
Calcium: 9.4 mg/dL (ref 8.6–10.3)
Chloride: 102 mmol/L (ref 98–110)
Creat: 1.14 mg/dL (ref 0.70–1.22)
Globulin: 3.2 g/dL (calc) (ref 1.9–3.7)
Glucose, Bld: 65 mg/dL (ref 65–99)
Potassium: 3.9 mmol/L (ref 3.5–5.3)
Sodium: 138 mmol/L (ref 135–146)
Total Bilirubin: 0.6 mg/dL (ref 0.2–1.2)
Total Protein: 6.9 g/dL (ref 6.1–8.1)
eGFR: 60 mL/min/{1.73_m2} (ref >=60)

## 2021-02-21 LAB — CBC WITH DIFFERENTIAL/PLATELET
Absolute Monocytes: 684 cells/uL (ref 200–950)
Basophils Absolute: 51 cells/uL (ref 0–200)
Basophils Relative: 0.9 %
Eosinophils Absolute: 285 cells/uL (ref 15–500)
Eosinophils Relative: 5 %
HCT: 37.6 % — ABNORMAL LOW (ref 38.5–50.0)
Hemoglobin: 12.5 g/dL — ABNORMAL LOW (ref 13.2–17.1)
Lymphs Abs: 1511 cells/uL (ref 850–3900)
MCH: 31.2 pg (ref 27.0–33.0)
MCHC: 33.2 g/dL (ref 32.0–36.0)
MCV: 93.8 fL (ref 80.0–100.0)
MPV: 10.9 fL (ref 7.5–12.5)
Monocytes Relative: 12 %
Neutro Abs: 3169 cells/uL (ref 1500–7800)
Neutrophils Relative %: 55.6 %
Platelets: 166 10*3/uL (ref 140–400)
RBC: 4.01 10*6/uL — ABNORMAL LOW (ref 4.20–5.80)
RDW: 12.3 % (ref 11.0–15.0)
Total Lymphocyte: 26.5 %
WBC: 5.7 10*3/uL (ref 3.8–10.8)

## 2021-02-21 MED ORDER — NYSTATIN 100000 UNIT/GM EX POWD: Freq: Three times a day (TID) | CUTANEOUS | 2 refills | Status: DC
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
## 2021-02-23 ENCOUNTER — Ambulatory Visit: Payer: Medicare Other | Admitting: Nurse Practitioner

## 2021-03-22 ENCOUNTER — Ambulatory Visit (INDEPENDENT_AMBULATORY_CARE_PROVIDER_SITE_OTHER): Payer: Medicare Other | Admitting: Nurse Practitioner

## 2021-03-22 ENCOUNTER — Encounter: Payer: Self-pay | Admitting: Nurse Practitioner

## 2021-03-22 ENCOUNTER — Other Ambulatory Visit: Payer: Self-pay

## 2021-03-22 ENCOUNTER — Telehealth: Payer: Self-pay

## 2021-03-22 DIAGNOSIS — Z0000 Encounter for general adult medical examination without abnormal findings: Secondary | ICD-10-CM

## 2021-03-22 NOTE — Telephone Encounter (Signed)
Mr. joanne, brander are scheduled for a virtual visit with your provider today.    Just as we do with appointments in the office, we must obtain your consent to participate.  Your consent will be active for this visit and any virtual visit you may have with one of our providers in the next 365 days.    If you have a MyChart account, I can also send a copy of this consent to you electronically.  All virtual visits are billed to your insurance company just like a traditional visit in the office.  As this is a virtual visit, video technology does not allow for your provider to perform a traditional examination.  This may limit your provider's ability to fully assess your condition.  If your provider identifies any concerns that need to be evaluated in person or the need to arrange testing such as labs, EKG, etc, we will make arrangements to do so.    Although advances in technology are sophisticated, we cannot ensure that it will always work on either your end or our end.  If the connection with a video visit is poor, we may have to switch to a telephone visit.  With either a video or telephone visit, we are not always able to ensure that we have a secure connection.   I need to obtain your verbal consent now.   Are you willing to proceed with your visit today?   GIOVANNY DUGAL has provided verbal consent on 03/22/2021 for a virtual visit (video or telephone).   Carroll Kinds, CMA 03/22/2021  10:34 AM

## 2021-03-22 NOTE — Patient Instructions (Signed)
[name] , Thank you for taking time to come for your Medicare Wellness Visit. I appreciate your ongoing commitment to your health goals. Please review the following plan we discussed and let me know if I can assist you in the future.   Screening recommendations/referrals: Colonoscopy aged out Recommended yearly ophthalmology/optometry visit for glaucoma screening and checkup Recommended yearly dental visit for hygiene and checkup  Vaccinations: Influenza vaccine up to date Pneumococcal vaccine up to date Tdap vaccine up to date Shingles vaccine up to date    Advanced directives: on file.   Conditions/risks identified: progressive memory loss, at risk for nutritional deficit  Next appointment: yearly for AWV  Preventive Care 76 Years and Older, Male Preventive care refers to lifestyle choices and visits with your health care provider that can promote health and wellness. What does preventive care include? A yearly physical exam. This is also called an annual well check. Dental exams once or twice a year. Routine eye exams. Ask your health care provider how often you should have your eyes checked. Personal lifestyle choices, including: Daily care of your teeth and gums. Regular physical activity. Eating a healthy diet. Avoiding tobacco and drug use. Limiting alcohol use. Practicing safe sex. Taking low doses of aspirin every day. Taking vitamin and mineral supplements as recommended by your health care provider. What happens during an annual well check? The services and screenings done by your health care provider during your annual well check will depend on your age, overall health, lifestyle risk factors, and family history of disease. Counseling  Your health care provider may ask you questions about your: Alcohol use. Tobacco use. Drug use. Emotional well-being. Home and relationship well-being. Sexual activity. Eating habits. History of falls. Memory and ability to  understand (cognition). Work and work Statistician. Screening  You may have the following tests or measurements: Height, weight, and BMI. Blood pressure. Lipid and cholesterol levels. These may be checked every 5 years, or more frequently if you are over 66 years old. Skin check. Lung cancer screening. You may have this screening every year starting at age 33 if you have a 30-pack-year history of smoking and currently smoke or have quit within the past 15 years. Fecal occult blood test (FOBT) of the stool. You may have this test every year starting at age 79. Flexible sigmoidoscopy or colonoscopy. You may have a sigmoidoscopy every 5 years or a colonoscopy every 10 years starting at age 52. Prostate cancer screening. Recommendations will vary depending on your family history and other risks. Hepatitis C blood test. Hepatitis B blood test. Sexually transmitted disease (STD) testing. Diabetes screening. This is done by checking your blood sugar (glucose) after you have not eaten for a while (fasting). You may have this done every 1-3 years. Abdominal aortic aneurysm (AAA) screening. You may need this if you are a current or former smoker. Osteoporosis. You may be screened starting at age 40 if you are at high risk. Talk with your health care provider about your test results, treatment options, and if necessary, the need for more tests. Vaccines  Your health care provider may recommend certain vaccines, such as: Influenza vaccine. This is recommended every year. Tetanus, diphtheria, and acellular pertussis (Tdap, Td) vaccine. You may need a Td booster every 10 years. Zoster vaccine. You may need this after age 93. Pneumococcal 13-valent conjugate (PCV13) vaccine. One dose is recommended after age 17. Pneumococcal polysaccharide (PPSV23) vaccine. One dose is recommended after age 65. Talk to your health  care provider about which screenings and vaccines you need and how often you need them. This  information is not intended to replace advice given to you by your health care provider. Make sure you discuss any questions you have with your health care provider. Document Released: 06/25/2015 Document Revised: 02/16/2016 Document Reviewed: 03/30/2015 Elsevier Interactive Patient Education  2017 [place] Prevention in the Home Falls can cause injuries. They can happen to people of all ages. There are many things you can do to make your home safe and to help prevent falls. What can I do on the outside of my home? Regularly fix the edges of walkways and driveways and fix any cracks. Remove anything that might make you trip as you walk through a door, such as a raised step or threshold. Trim any bushes or trees on the path to your home. Use bright outdoor lighting. Clear any walking paths of anything that might make someone trip, such as rocks or tools. Regularly check to see if handrails are loose or broken. Make sure that both sides of any steps have handrails. Any raised decks and porches should have guardrails on the edges. Have any leaves, snow, or ice cleared regularly. Use sand or salt on walking paths during winter. Clean up any spills in your garage right away. This includes oil or grease spills. What can I do in the bathroom? Use night lights. Install grab bars by the toilet and in the tub and shower. Do not use towel bars as grab bars. Use non-skid mats or decals in the tub or shower. If you need to sit down in the shower, use a plastic, non-slip stool. Keep the floor dry. Clean up any water that spills on the floor as soon as it happens. Remove soap buildup in the tub or shower regularly. Attach bath mats securely with double-sided non-slip rug tape. Do not have throw rugs and other things on the floor that can make you trip. What can I do in the bedroom? Use night lights. Make sure that you have a light by your bed that is easy to reach. Do not use any sheets or  blankets that are too big for your bed. They should not hang down onto the floor. Have a firm chair that has side arms. You can use this for support while you get dressed. Do not have throw rugs and other things on the floor that can make you trip. What can I do in the kitchen? Clean up any spills right away. Avoid walking on wet floors. Keep items that you use a lot in easy-to-reach places. If you need to reach something above you, use a strong step stool that has a grab bar. Keep electrical cords out of the way. Do not use floor polish or wax that makes floors slippery. If you must use wax, use non-skid floor wax. Do not have throw rugs and other things on the floor that can make you trip. What can I do with my stairs? Do not leave any items on the stairs. Make sure that there are handrails on both sides of the stairs and use them. Fix handrails that are broken or loose. Make sure that handrails are as long as the stairways. Check any carpeting to make sure that it is firmly attached to the stairs. Fix any carpet that is loose or worn. Avoid having throw rugs at the top or bottom of the stairs. If you do have throw rugs, attach them to  the floor with carpet tape. Make sure that you have a light switch at the top of the stairs and the bottom of the stairs. If you do not have them, ask someone to add them for you. What else can I do to help prevent falls? Wear shoes that: Do not have high heels. Have rubber bottoms. Are comfortable and fit you well. Are closed at the toe. Do not wear sandals. If you use a stepladder: Make sure that it is fully opened. Do not climb a closed stepladder. Make sure that both sides of the stepladder are locked into place. Ask someone to hold it for you, if possible. Clearly mark and make sure that you can see: Any grab bars or handrails. First and last steps. Where the edge of each step is. Use tools that help you move around (mobility aids) if they are  needed. These include: Canes. Walkers. Scooters. Crutches. Turn on the lights when you go into a dark area. Replace any light bulbs as soon as they burn out. Set up your furniture so you have a clear path. Avoid moving your furniture around. If any of your floors are uneven, fix them. If there are any pets around you, be aware of where they are. Review your medicines with your doctor. Some medicines can make you feel dizzy. This can increase your chance of falling. Ask your doctor what other things that you can do to help prevent falls. This information is not intended to replace advice given to you by your health care provider. Make sure you discuss any questions you have with your health care provider. Document Released: 03/25/2009 Document Revised: 11/04/2015 Document Reviewed: 07/03/2014 Elsevier Interactive Patient Education  2017 [employer].
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
## 2021-03-22 NOTE — Progress Notes (Signed)
This service is provided via telemedicine  No vital signs collected/recorded due to the encounter was a telemedicine visit.   Location of patient (ex: home, work):  Home  Patient consents to a telephone visit:  Yes, see enocunter dated 03/22/2021  Location of the provider (ex: office, home):  [place]  Name of any referring provider:  N/A  Names of all persons participating in the telemedicine service and their role in the encounter:  Sherrie Mustache, Nurse Practitioner, Carroll Kinds, CMA, and patient.   Time spent on call:  14 minutes with medical assistant

## 2021-05-31 ENCOUNTER — Encounter: Payer: Self-pay | Admitting: Cardiology

## 2021-05-31 ENCOUNTER — Ambulatory Visit (INDEPENDENT_AMBULATORY_CARE_PROVIDER_SITE_OTHER): Payer: Medicare Other | Admitting: Cardiology

## 2021-05-31 ENCOUNTER — Other Ambulatory Visit: Payer: Self-pay

## 2021-05-31 VITALS — BP 94/50 | HR 72 | Ht 67.0 in | Wt 126.0 lb

## 2021-05-31 DIAGNOSIS — I10 Essential (primary) hypertension: Secondary | ICD-10-CM | POA: Diagnosis not present

## 2021-05-31 DIAGNOSIS — I4510 Unspecified right bundle-branch block: Secondary | ICD-10-CM

## 2021-05-31 DIAGNOSIS — N1832 Chronic kidney disease, stage 3b: Secondary | ICD-10-CM | POA: Diagnosis not present

## 2021-05-31 NOTE — Patient Instructions (Signed)
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
## 2021-06-14 ENCOUNTER — Other Ambulatory Visit: Payer: Self-pay

## 2021-06-14 ENCOUNTER — Emergency Department (HOSPITAL_BASED_OUTPATIENT_CLINIC_OR_DEPARTMENT_OTHER): Payer: Medicare Other

## 2021-06-14 ENCOUNTER — Encounter (HOSPITAL_COMMUNITY): Payer: Self-pay | Admitting: Family Medicine

## 2021-06-14 ENCOUNTER — Inpatient Hospital Stay (HOSPITAL_BASED_OUTPATIENT_CLINIC_OR_DEPARTMENT_OTHER)
Admission: EM | Admit: 2021-06-14 | Discharge: 2021-06-20 | DRG: 521 | Disposition: A | Discharge status: Skilled Nursing Facility | Payer: Medicare Other | Attending: Student | Admitting: Student

## 2021-06-14 DIAGNOSIS — Z9989 Dependence on other enabling machines and devices: Secondary | ICD-10-CM | POA: Diagnosis not present

## 2021-06-14 DIAGNOSIS — S72002A Fracture of unspecified part of neck of left femur, initial encounter for closed fracture: Secondary | ICD-10-CM | POA: Diagnosis not present

## 2021-06-14 DIAGNOSIS — R410 Disorientation, unspecified: Secondary | ICD-10-CM | POA: Diagnosis present

## 2021-06-14 DIAGNOSIS — Z79899 Other long term (current) drug therapy: Secondary | ICD-10-CM | POA: Diagnosis not present

## 2021-06-14 DIAGNOSIS — Z7189 Other specified counseling: Secondary | ICD-10-CM | POA: Diagnosis not present

## 2021-06-14 DIAGNOSIS — Z20822 Contact with and (suspected) exposure to covid-19: Secondary | ICD-10-CM | POA: Diagnosis not present

## 2021-06-14 DIAGNOSIS — Z66 Do not resuscitate: Secondary | ICD-10-CM | POA: Diagnosis not present

## 2021-06-14 DIAGNOSIS — R339 Retention of urine, unspecified: Secondary | ICD-10-CM | POA: Diagnosis present

## 2021-06-14 DIAGNOSIS — N1831 Chronic kidney disease, stage 3a: Secondary | ICD-10-CM | POA: Diagnosis not present

## 2021-06-14 DIAGNOSIS — B952 Enterococcus as the cause of diseases classified elsewhere: Secondary | ICD-10-CM | POA: Diagnosis present

## 2021-06-14 DIAGNOSIS — Z6820 Body mass index (BMI) 20.0-20.9, adult: Secondary | ICD-10-CM

## 2021-06-14 DIAGNOSIS — W19XXXA Unspecified fall, initial encounter: Secondary | ICD-10-CM | POA: Diagnosis present

## 2021-06-14 DIAGNOSIS — N1832 Chronic kidney disease, stage 3b: Secondary | ICD-10-CM | POA: Diagnosis not present

## 2021-06-14 DIAGNOSIS — Z7982 Long term (current) use of aspirin: Secondary | ICD-10-CM

## 2021-06-14 DIAGNOSIS — I959 Hypotension, unspecified: Secondary | ICD-10-CM | POA: Diagnosis not present

## 2021-06-14 DIAGNOSIS — R1310 Dysphagia, unspecified: Secondary | ICD-10-CM | POA: Diagnosis present

## 2021-06-14 DIAGNOSIS — Z809 Family history of malignant neoplasm, unspecified: Secondary | ICD-10-CM

## 2021-06-14 DIAGNOSIS — G301 Alzheimer's disease with late onset: Secondary | ICD-10-CM | POA: Diagnosis not present

## 2021-06-14 DIAGNOSIS — G309 Alzheimer's disease, unspecified: Secondary | ICD-10-CM

## 2021-06-14 DIAGNOSIS — G9340 Encephalopathy, unspecified: Secondary | ICD-10-CM | POA: Diagnosis not present

## 2021-06-14 DIAGNOSIS — Y92009 Unspecified place in unspecified non-institutional (private) residence as the place of occurrence of the external cause: Secondary | ICD-10-CM | POA: Diagnosis not present

## 2021-06-14 DIAGNOSIS — N179 Acute kidney failure, unspecified: Secondary | ICD-10-CM | POA: Diagnosis not present

## 2021-06-14 DIAGNOSIS — H548 Legal blindness, as defined in USA: Secondary | ICD-10-CM | POA: Diagnosis present

## 2021-06-14 DIAGNOSIS — G9341 Metabolic encephalopathy: Secondary | ICD-10-CM | POA: Diagnosis not present

## 2021-06-14 DIAGNOSIS — N401 Enlarged prostate with lower urinary tract symptoms: Secondary | ICD-10-CM | POA: Diagnosis present

## 2021-06-14 DIAGNOSIS — F0282 Dementia in other diseases classified elsewhere, unspecified severity, with psychotic disturbance: Secondary | ICD-10-CM | POA: Diagnosis not present

## 2021-06-14 DIAGNOSIS — I129 Hypertensive chronic kidney disease with stage 1 through stage 4 chronic kidney disease, or unspecified chronic kidney disease: Secondary | ICD-10-CM | POA: Diagnosis present

## 2021-06-14 DIAGNOSIS — M25512 Pain in left shoulder: Secondary | ICD-10-CM

## 2021-06-14 DIAGNOSIS — D631 Anemia in chronic kidney disease: Secondary | ICD-10-CM | POA: Diagnosis not present

## 2021-06-14 DIAGNOSIS — Y846 Urinary catheterization as the cause of abnormal reaction of the patient, or of later complication, without mention of misadventure at the time of the procedure: Secondary | ICD-10-CM | POA: Diagnosis present

## 2021-06-14 DIAGNOSIS — D696 Thrombocytopenia, unspecified: Secondary | ICD-10-CM | POA: Diagnosis not present

## 2021-06-14 DIAGNOSIS — S72012A Unspecified intracapsular fracture of left femur, initial encounter for closed fracture: Principal | ICD-10-CM | POA: Diagnosis present

## 2021-06-14 DIAGNOSIS — Z87891 Personal history of nicotine dependence: Secondary | ICD-10-CM

## 2021-06-14 DIAGNOSIS — E871 Hypo-osmolality and hyponatremia: Secondary | ICD-10-CM | POA: Diagnosis present

## 2021-06-14 DIAGNOSIS — T83511A Infection and inflammatory reaction due to indwelling urethral catheter, initial encounter: Secondary | ICD-10-CM | POA: Diagnosis not present

## 2021-06-14 DIAGNOSIS — I951 Orthostatic hypotension: Secondary | ICD-10-CM | POA: Diagnosis not present

## 2021-06-14 DIAGNOSIS — I4510 Unspecified right bundle-branch block: Secondary | ICD-10-CM | POA: Diagnosis present

## 2021-06-14 DIAGNOSIS — I10 Essential (primary) hypertension: Secondary | ICD-10-CM | POA: Diagnosis present

## 2021-06-14 DIAGNOSIS — Z8249 Family history of ischemic heart disease and other diseases of the circulatory system: Secondary | ICD-10-CM

## 2021-06-14 DIAGNOSIS — F03918 Unspecified dementia, unspecified severity, with other behavioral disturbance: Secondary | ICD-10-CM | POA: Diagnosis present

## 2021-06-14 DIAGNOSIS — G4733 Obstructive sleep apnea (adult) (pediatric): Secondary | ICD-10-CM | POA: Diagnosis present

## 2021-06-14 DIAGNOSIS — H9190 Unspecified hearing loss, unspecified ear: Secondary | ICD-10-CM | POA: Diagnosis present

## 2021-06-14 DIAGNOSIS — N390 Urinary tract infection, site not specified: Secondary | ICD-10-CM | POA: Diagnosis present

## 2021-06-14 DIAGNOSIS — E43 Unspecified severe protein-calorie malnutrition: Secondary | ICD-10-CM | POA: Diagnosis present

## 2021-06-14 DIAGNOSIS — N183 Chronic kidney disease, stage 3 unspecified: Secondary | ICD-10-CM | POA: Diagnosis present

## 2021-06-14 DIAGNOSIS — T83518A Infection and inflammatory reaction due to other urinary catheter, initial encounter: Secondary | ICD-10-CM | POA: Diagnosis present

## 2021-06-14 DIAGNOSIS — F0392 Unspecified dementia, unspecified severity, with psychotic disturbance: Secondary | ICD-10-CM | POA: Diagnosis present

## 2021-06-14 DIAGNOSIS — R338 Other retention of urine: Secondary | ICD-10-CM | POA: Diagnosis present

## 2021-06-14 DIAGNOSIS — E860 Dehydration: Secondary | ICD-10-CM | POA: Diagnosis not present

## 2021-06-14 DIAGNOSIS — Z515 Encounter for palliative care: Secondary | ICD-10-CM | POA: Diagnosis not present

## 2021-06-14 DIAGNOSIS — D649 Anemia, unspecified: Secondary | ICD-10-CM | POA: Diagnosis not present

## 2021-06-14 DIAGNOSIS — F0390 Unspecified dementia without behavioral disturbance: Secondary | ICD-10-CM | POA: Diagnosis present

## 2021-06-14 DIAGNOSIS — I9581 Postprocedural hypotension: Secondary | ICD-10-CM | POA: Diagnosis not present

## 2021-06-14 DIAGNOSIS — R1312 Dysphagia, oropharyngeal phase: Secondary | ICD-10-CM | POA: Diagnosis not present

## 2021-06-14 DIAGNOSIS — T83511S Infection and inflammatory reaction due to indwelling urethral catheter, sequela: Secondary | ICD-10-CM | POA: Diagnosis not present

## 2021-06-14 DIAGNOSIS — Z820 Family history of epilepsy and other diseases of the nervous system: Secondary | ICD-10-CM

## 2021-06-14 DIAGNOSIS — Z9889 Other specified postprocedural states: Secondary | ICD-10-CM

## 2021-06-14 DIAGNOSIS — E785 Hyperlipidemia, unspecified: Secondary | ICD-10-CM | POA: Diagnosis present

## 2021-06-14 DIAGNOSIS — Z87440 Personal history of urinary (tract) infections: Secondary | ICD-10-CM

## 2021-06-14 LAB — COMPREHENSIVE METABOLIC PANEL
ALT: 17 U/L (ref 0–44)
AST: 27 U/L (ref 15–41)
Albumin: 3.8 g/dL (ref 3.5–5.0)
Alkaline Phosphatase: 177 U/L — ABNORMAL HIGH (ref 38–126)
Anion gap: 11 (ref 5–15)
BUN: 14 mg/dL (ref 8–23)
CO2: 25 mmol/L (ref 22–32)
Calcium: 9.3 mg/dL (ref 8.9–10.3)
Chloride: 98 mmol/L (ref 98–111)
Creatinine, Ser: 1.51 mg/dL — ABNORMAL HIGH (ref 0.61–1.24)
GFR, Estimated: 43 mL/min — ABNORMAL LOW (ref >60)
Glucose, Bld: 118 mg/dL — ABNORMAL HIGH (ref 70–99)
Potassium: 3.9 mmol/L (ref 3.5–5.1)
Sodium: 134 mmol/L — ABNORMAL LOW (ref 135–145)
Total Bilirubin: 0.7 mg/dL (ref 0.3–1.2)
Total Protein: 7.7 g/dL (ref 6.5–8.1)

## 2021-06-14 LAB — CBC WITH DIFFERENTIAL/PLATELET
Abs Immature Granulocytes: 0.02 10*3/uL (ref 0.00–0.07)
Basophils Absolute: 0 10*3/uL (ref 0.0–0.1)
Basophils Relative: 0 %
Eosinophils Absolute: 0.1 10*3/uL (ref 0.0–0.5)
Eosinophils Relative: 1 %
HCT: 41.1 % (ref 39.0–52.0)
Hemoglobin: 13.5 g/dL (ref 13.0–17.0)
Immature Granulocytes: 0 %
Lymphocytes Relative: 10 %
Lymphs Abs: 0.7 10*3/uL (ref 0.7–4.0)
MCH: 31.3 pg (ref 26.0–34.0)
MCHC: 32.8 g/dL (ref 30.0–36.0)
MCV: 95.4 fL (ref 80.0–100.0)
Monocytes Absolute: 0.5 10*3/uL (ref 0.1–1.0)
Monocytes Relative: 7 %
Neutro Abs: 5.8 10*3/uL (ref 1.7–7.7)
Neutrophils Relative %: 82 %
Platelets: 163 10*3/uL (ref 150–400)
RBC: 4.31 MIL/uL (ref 4.22–5.81)
RDW: 13.2 % (ref 11.5–15.5)
WBC: 7.1 10*3/uL (ref 4.0–10.5)
nRBC: 0 % (ref 0.0–0.2)

## 2021-06-14 LAB — URINALYSIS, MICROSCOPIC (REFLEX)

## 2021-06-14 LAB — SODIUM, URINE, RANDOM: Sodium, Ur: 143 mmol/L

## 2021-06-14 LAB — TROPONIN I (HIGH SENSITIVITY)
Troponin I (High Sensitivity): 7 ng/L (ref <18)
Troponin I (High Sensitivity): 5 ng/L (ref <18)

## 2021-06-14 LAB — SURGICAL PCR SCREEN
MRSA, PCR: NEGATIVE
Staphylococcus aureus: NEGATIVE

## 2021-06-14 LAB — URINALYSIS, ROUTINE W REFLEX MICROSCOPIC
Bilirubin Urine: NEGATIVE
Glucose, UA: NEGATIVE mg/dL
Hgb urine dipstick: NEGATIVE
Ketones, ur: NEGATIVE mg/dL
Nitrite: NEGATIVE
Protein, ur: NEGATIVE mg/dL
Specific Gravity, Urine: 1.015 (ref 1.005–1.030)
pH: 7.5 (ref 5.0–8.0)

## 2021-06-14 LAB — CREATININE, URINE, RANDOM: Creatinine, Urine: 133.41 mg/dL

## 2021-06-14 LAB — LACTIC ACID, PLASMA: Lactic Acid, Venous: 1.6 mmol/L (ref 0.5–1.9)

## 2021-06-14 LAB — RESP PANEL BY RT-PCR (FLU A&B, COVID) ARPGX2
Influenza A by PCR: NEGATIVE
Influenza B by PCR: NEGATIVE
SARS Coronavirus 2 by RT PCR: NEGATIVE

## 2021-06-14 MED ORDER — CHLORHEXIDINE GLUCONATE 4 % EX LIQD
60.0000 mL | Freq: Once | CUTANEOUS | Status: DC
  Administered 2021-06-15: 4 via TOPICAL
  Filled 2021-06-14: qty 60

## 2021-06-14 MED ORDER — CEFAZOLIN SODIUM-DEXTROSE 2-4 GM/100ML-% IV SOLN
2.0000 g | INTRAVENOUS | Status: AC
  Administered 2021-06-15: 2 g via INTRAVENOUS
  Filled 2021-06-14: qty 100

## 2021-06-14 MED ORDER — LACTATED RINGERS IV SOLN: INTRAVENOUS | Status: AC

## 2021-06-14 MED ORDER — TRANEXAMIC ACID-NACL 1000-0.7 MG/100ML-% IV SOLN
1000.0000 mg | INTRAVENOUS | Status: AC
  Administered 2021-06-15: 1000 mg via INTRAVENOUS

## 2021-06-14 MED ORDER — FENTANYL CITRATE PF 50 MCG/ML IJ SOSY
50.0000 ug | PREFILLED_SYRINGE | Freq: Once | INTRAMUSCULAR | Status: AC
  Administered 2021-06-14: 50 ug via INTRAVENOUS
  Filled 2021-06-14: qty 1

## 2021-06-14 MED ORDER — DORZOLAMIDE HCL-TIMOLOL MAL 2-0.5 % OP SOLN
1.0000 [drp] | Freq: Two times a day (BID) | OPHTHALMIC | Status: DC
  Administered 2021-06-14 – 2021-06-20 (×13): 1 [drp] via OPHTHALMIC
  Filled 2021-06-14: qty 10

## 2021-06-14 MED ORDER — CHLORHEXIDINE GLUCONATE CLOTH 2 % EX PADS
6.0000 | MEDICATED_PAD | Freq: Every day | CUTANEOUS | Status: DC
  Administered 2021-06-16 – 2021-06-20 (×5): 6 via TOPICAL

## 2021-06-14 MED ORDER — MORPHINE SULFATE (PF) 2 MG/ML IV SOLN
0.5000 mg | INTRAVENOUS | Status: DC | PRN
  Administered 2021-06-14: 1 mg via INTRAVENOUS
  Filled 2021-06-14: qty 1

## 2021-06-14 MED ORDER — SODIUM CHLORIDE 0.9 % IV SOLN
2.0000 g | Freq: Once | INTRAVENOUS | Status: AC
  Administered 2021-06-14: 2 g via INTRAVENOUS
  Filled 2021-06-14: qty 20

## 2021-06-14 MED ORDER — BRIMONIDINE TARTRATE 0.2 % OP SOLN
1.0000 [drp] | Freq: Two times a day (BID) | OPHTHALMIC | Status: DC
  Administered 2021-06-14 – 2021-06-20 (×11): 1 [drp] via OPHTHALMIC
  Filled 2021-06-14: qty 5

## 2021-06-14 MED ORDER — SENNOSIDES-DOCUSATE SODIUM 8.6-50 MG PO TABS: 1.0000 | ORAL_TABLET | Freq: Every evening | ORAL | Status: DC | PRN

## 2021-06-14 MED ORDER — MELATONIN 3 MG PO TABS: 3.0000 mg | ORAL_TABLET | Freq: Every evening | ORAL | Status: DC | PRN

## 2021-06-14 MED ORDER — SODIUM CHLORIDE 0.9 % IV SOLN
1.0000 g | INTRAVENOUS | Status: DC
  Administered 2021-06-15 – 2021-06-16 (×2): 1 g via INTRAVENOUS
  Filled 2021-06-14 (×2): qty 10

## 2021-06-14 MED ORDER — POVIDONE-IODINE 10 % EX SWAB: 2.0000 "application " | Freq: Once | CUTANEOUS | Status: DC

## 2021-06-14 NOTE — ED Provider Notes (Signed)
[facility] EMERGENCY DEPARTMENT Provider Note   CSN: [id number] Arrival date & time: 06/14/21  8127     History  Chief Complaint  Patient presents with   Lytle Michaels    [name] is a 88 y.o. male.  HPI     88 year old male with history of BPH, bundle branch block, dementia, hypertension, CKD, OSA, chronic Foley catheter in place seen by urology 1227 with [facility] and diagnosed with urinary tract infection on Bactrim, who presents with concern for 2 days of confusion and fall with left hip and shoulder pain.  Family reports that they had initially began treating the urinary tract infection given concern for cloudy appearing urine and urinalysis concerning for this.  He had otherwise been in normal state of health until 2 days ago when he began to develop some confusion.  Reports that he is usually in the chair, and walks with a walker and maximal assistance, however with his confusion has tried to get up and walk independently which she cannot do.  With this he had a fall last night.  He had had some hip pain, was able to move it and pain seem to be okay and went to bed.  This morning, his pain was severe.  He is also been confused, calling out for his wife for help even though she has been laying next to him.  No known fevers, nausea, vomiting, has not complained of chest pain, shortness of breath, abdominal pain, diarrhea.  He has some chronic cough which is not changed and improved when he uses his CPAP machine. Has pain in his left hip and left shoulder. Has not complained of back pain or neck pain. He is usually pretty stoic and does not complain. On my evaluation reports some chest pain.    Home Medications Prior to Admission medications   Medication Sig Start Date End Date Taking? Authorizing Provider  acetaminophen (TYLENOL) 500 MG tablet Take 500 mg by mouth as needed for moderate pain.    [provider]  aspirin EC 81 MG tablet Take 81 mg by mouth daily.     [provider]  brimonidine (ALPHAGAN) 0.2 % ophthalmic solution Place 1 drop into the left eye Twice daily.  02/28/11   [provider]  cetirizine (ZYRTEC) 10 MG tablet Take 10 mg by mouth as needed for allergies.    [provider]  clotrimazole (LOTRIMIN) 1 % cream Apply 1 application topically 2 (two) times daily as needed (itching).    [provider]  dorzolamide-timolol (COSOPT) 22.3-6.8 MG/ML ophthalmic solution Place 1 drop into the left eye 2 (two) times daily.    [provider]  Famotidine (PEPCID PO) Take by mouth as needed.    [provider]  loratadine (CLARITIN) 10 MG tablet Take 1 tablet (10 mg total) by mouth daily as needed for allergies. 11/14/19   Ngetich, Dinah C, NP  Multiple Vitamin (MULTIVITAMIN) tablet Take 1 tablet by mouth daily.    [provider]  nystatin (MYCOSTATIN/NYSTOP) powder Apply 1 application topically 3 (three) times daily. Unknown strength    [provider]  polyethylene glycol (MIRALAX / GLYCOLAX) 17 g packet Take 17 g by mouth daily.    [provider]  UNABLE TO FIND Med Name: CPAP machine, minimal of 4 hours per night    [provider]      Allergies    Patient has no known allergies.    Review of Systems  Review of Systems SEE ROS ABOVE  Physical Exam Updated Vital Signs BP 108/73    Pulse 74    Temp 98 F (36.7 C) (Oral)    Resp 17    SpO2 93%  Physical Exam Vitals and nursing note reviewed.  Constitutional:      General: He is not in acute distress.    Appearance: Normal appearance. He is not ill-appearing, toxic-appearing or diaphoretic.  HENT:     Head: Normocephalic.  Eyes:     Conjunctiva/sclera: Conjunctivae normal.  Cardiovascular:     Rate and Rhythm: Normal rate and regular rhythm.     Pulses: Normal pulses.  Pulmonary:     Effort: Pulmonary effort is normal. No respiratory distress.  Musculoskeletal:        General: Tenderness  (left hip) present. No deformity or signs of injury.     Cervical back: No rigidity.  Skin:    General: Skin is warm and dry.     Coloration: Skin is not jaundiced or pale.  Neurological:     General: No focal deficit present.     Mental Status: He is alert.     Comments: Normal bilateral upper extremity strength, LE deffered due to pain, symmetric smile    ED Results / Procedures / Treatments   Labs (all labs ordered are listed, but only abnormal results are displayed) Labs Reviewed  COMPREHENSIVE METABOLIC PANEL - Abnormal; Notable for the following components:      Result Value   Sodium 134 (*)    Glucose, Bld 118 (*)    Creatinine, Ser 1.51 (*)    Alkaline Phosphatase 177 (*)    GFR, Estimated 43 (*)    All other components within normal limits  URINALYSIS, ROUTINE W REFLEX MICROSCOPIC - Abnormal; Notable for the following components:   Leukocytes,Ua SMALL (*)    All other components within normal limits  URINALYSIS, MICROSCOPIC (REFLEX) - Abnormal; Notable for the following components:   Bacteria, UA FEW (*)    All other components within normal limits  RESP PANEL BY RT-PCR (FLU A&B, COVID) ARPGX2  CULTURE, BLOOD (ROUTINE X 2)  CULTURE, BLOOD (ROUTINE X 2)  URINE CULTURE  LACTIC ACID, PLASMA  CBC WITH DIFFERENTIAL/PLATELET  LACTIC ACID, PLASMA  TROPONIN I (HIGH SENSITIVITY)  TROPONIN I (HIGH SENSITIVITY)    EKG EKG Interpretation  Date/Time:  Tuesday June 14 2021 12:27:10 EST Ventricular Rate:  78 PR Interval:  171 QRS Duration: 153 QT Interval:  405 QTC Calculation: 462 R Axis:   268 Text Interpretation: Sinus rhythm Ventricular premature complex RBBB and LAFB Inferior infarct, acute Lateral leads are also involved No previous ECGs available Confirmed by Gareth Morgan [phone]) on 06/14/2021 1:09:50 PM  Radiology DG Chest 1 View  Result Date: 06/14/2021 CLINICAL DATA:  Fall.  Hip fracture EXAM: CHEST  1 VIEW COMPARISON:  09/15/2020 FINDINGS: Cardiac and  mediastinal contours normal. Atherosclerotic aortic arch. Normal vascularity Lungs are clear without infiltrate or effusion. Mild elevation left hemidiaphragm. IMPRESSION: No active disease. Electronically Signed   By: Franchot Gallo M.D.   On: 06/14/2021 11:56   CT Head Wo Contrast  Result Date: 06/14/2021 CLINICAL DATA:  Fall, head injury.  Confusion. EXAM: CT HEAD WITHOUT CONTRAST TECHNIQUE: Contiguous axial images were obtained from the base of the skull through the vertex without intravenous contrast. COMPARISON:  CT head 09/14/2020 FINDINGS: Brain: Generalized atrophy. Moderate to extensive chronic white matter hypodensity unchanged. Negative for acute infarct, hemorrhage, mass Vascular: Negative for hyperdense  vessel Skull: Negative Sinuses/Orbits: Small air-fluid level sphenoid sinus. Mild mucosal edema maxillary sinus bilaterally. Bilateral cataract surgery Other: None IMPRESSION: No acute abnormality. Atrophy and chronic microvascular ischemic change in the white matter. Electronically Signed   By: Franchot Gallo M.D.   On: 06/14/2021 10:51   DG Shoulder Left  Result Date: 06/14/2021 CLINICAL DATA:  Fall today EXAM: LEFT SHOULDER - 2+ VIEW COMPARISON:  None. FINDINGS: Normal alignment no fracture. Mild degenerative change in spurring AC joint. IMPRESSION: Negative. Electronically Signed   By: Franchot Gallo M.D.   On: 06/14/2021 11:54   DG Hip Unilat W or Wo Pelvis 2-3 Views Left  Result Date: 06/14/2021 CLINICAL DATA:  Fall today EXAM: DG HIP (WITH OR WITHOUT PELVIS) 2-3V LEFT COMPARISON:  None. FINDINGS: Fracture left femoral neck. Probable subcapital fracture with impaction and angulation. Left hip joint normal. No other fracture. IMPRESSION: Impacted subcapital left femoral neck fracture Electronically Signed   By: Franchot Gallo M.D.   On: 06/14/2021 11:55    Procedures Procedures    Medications Ordered in ED Medications  fentaNYL (SUBLIMAZE) injection 50 mcg (50 mcg Intravenous Given  06/14/21 1256)  cefTRIAXone (ROCEPHIN) 2 g in sodium chloride 0.9 % 100 mL IVPB (0 g Intravenous Stopped 06/14/21 1332)    ED Course/ Medical Decision Making/ A&P                           Medical Decision Making   88 year old male with history of BPH, bundle branch block, dementia, hypertension, CKD, OSA, chronic Foley catheter in place seen by urology 1227 with [facility] and diagnosed with urinary tract infection on Bactrim, who presents with concern for 2 days of confusion and fall with left hip and shoulder pain.  Labs show he normal hemoglobin, no leukocytosis.  Creatinine mildly elevated in comparison to prior.  COVID and influenza testing negative.  CT head reviewed by me and radiology without acute abnormalities.  XR of left shoulder reviewed by me with no acute abnormalities.  Reported chest pain at time of my evaluation. Troponin negative and EKG evaluated by me without significant abnormalities. Had not reported consistent pain, low suspicion for ACS, dissection, PE, occult rib fracture. XR also does not show thoracic spine fracture, not reporting consistent pain in this location.  Gave rocephin for recent diagnosis of UTI-UA without significant signs of infection at this time.  He remains hemodynamically stable without signs of fracture.   I personally reviewed and interpreted the x-rays which show a left femoral neck hip fracture.    Discussed with McBane PA-C who is working with Dr. Griffin Basil. Recommends admission to [facility], NPO at midnight for possible surgery tomorrow.  Admitted to hospitalist for further care.            Final Clinical Impression(s) / ED Diagnoses Final diagnoses:  Left displaced femoral neck fracture (HCC)  Confusion  Acute pain of left shoulder    Rx / DC Orders ED Discharge Orders     None         Gareth Morgan, MD 06/14/21 1655

## 2021-06-14 NOTE — Progress Notes (Signed)
Spoke to patient's wife and daughter over the phone. Xrays demonstrate a displaced femoral neck fracture. Recommend hip hemiarthroplasty scheduled for tomorrow morning. Goals of surgery: to alleviate pain and allow the patient to return to his baseline function, risks, and benefits were discussed. He is notably at high risk for infection given the active ongoing infections he is currently being treated for. Formal consultation note to be completed in the AM.

## 2021-06-14 NOTE — ED Notes (Signed)
Sleeping quietly; no s/s of pain.

## 2021-06-14 NOTE — Plan of Care (Signed)

## 2021-06-14 NOTE — Plan of Care (Signed)
Patient new to the floor. Patient stable. Handoff from Bryant stated Chronic foley. Paged on-call for new admit needing orders.    Problem: Education: Goal: Knowledge of General Education information will improve Description: Including pain rating scale, medication(s)/side effects and non-pharmacologic comfort measures Outcome: Progressing   Problem: Activity: Goal: Risk for activity intolerance will decrease Outcome: Progressing   Problem: Pain Managment: Goal: General experience of comfort will improve Outcome: Progressing   Problem: Safety: Goal: Ability to remain free from injury will improve Outcome: Progressing   Problem: Skin Integrity: Goal: Risk for impaired skin integrity will decrease Outcome: Progressing

## 2021-06-14 NOTE — Progress Notes (Addendum)
Orthopedics was consulted for patient's left femoral neck fracture. This will require surgical fixation. Patient to be transferred to [facility]. He will be admitted to hospitalist team. Please keep NPO at midnight. Formal consultation to follow.   Noemi Chapel, PA-C 06/14/2021

## 2021-06-14 NOTE — ED Triage Notes (Signed)
Brought in by EMS from home Being treated for UTI  with Bactrim Family reports he was more confused that usual last night.  He got OOB without assistance and fell landing on his left side.   No noted injury or complaints at that time. When dressing him this morning he complained of left shoulder and left hip pain.
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
## 2021-06-15 ENCOUNTER — Encounter (HOSPITAL_COMMUNITY): Payer: Self-pay | Admitting: Family Medicine

## 2021-06-15 ENCOUNTER — Encounter (HOSPITAL_COMMUNITY)
Admission: EM | Disposition: A | Discharge status: Skilled Nursing Facility | Payer: Self-pay | Source: Home / Self Care | Attending: Student

## 2021-06-15 ENCOUNTER — Inpatient Hospital Stay (HOSPITAL_COMMUNITY): Payer: Medicare Other | Admitting: Certified Registered Nurse Anesthetist

## 2021-06-15 ENCOUNTER — Inpatient Hospital Stay (HOSPITAL_COMMUNITY): Payer: Medicare Other

## 2021-06-15 DIAGNOSIS — W19XXXA Unspecified fall, initial encounter: Secondary | ICD-10-CM

## 2021-06-15 DIAGNOSIS — S72002A Fracture of unspecified part of neck of left femur, initial encounter for closed fracture: Secondary | ICD-10-CM

## 2021-06-15 DIAGNOSIS — T83511A Infection and inflammatory reaction due to indwelling urethral catheter, initial encounter: Secondary | ICD-10-CM

## 2021-06-15 DIAGNOSIS — N390 Urinary tract infection, site not specified: Secondary | ICD-10-CM

## 2021-06-15 DIAGNOSIS — D649 Anemia, unspecified: Secondary | ICD-10-CM

## 2021-06-15 DIAGNOSIS — Z9989 Dependence on other enabling machines and devices: Secondary | ICD-10-CM

## 2021-06-15 DIAGNOSIS — R339 Retention of urine, unspecified: Secondary | ICD-10-CM

## 2021-06-15 DIAGNOSIS — G9340 Encephalopathy, unspecified: Secondary | ICD-10-CM

## 2021-06-15 DIAGNOSIS — F03918 Unspecified dementia, unspecified severity, with other behavioral disturbance: Secondary | ICD-10-CM

## 2021-06-15 DIAGNOSIS — N1831 Chronic kidney disease, stage 3a: Secondary | ICD-10-CM

## 2021-06-15 DIAGNOSIS — G4733 Obstructive sleep apnea (adult) (pediatric): Secondary | ICD-10-CM

## 2021-06-15 DIAGNOSIS — D696 Thrombocytopenia, unspecified: Secondary | ICD-10-CM

## 2021-06-15 HISTORY — PX: HIP ARTHROPLASTY: SHX981

## 2021-06-15 LAB — BASIC METABOLIC PANEL
Anion gap: 9 (ref 5–15)
BUN: 12 mg/dL (ref 8–23)
CO2: 24 mmol/L (ref 22–32)
Calcium: 8.8 mg/dL — ABNORMAL LOW (ref 8.9–10.3)
Chloride: 101 mmol/L (ref 98–111)
Creatinine, Ser: 1.41 mg/dL — ABNORMAL HIGH (ref 0.61–1.24)
GFR, Estimated: 46 mL/min — ABNORMAL LOW (ref >60)
Glucose, Bld: 114 mg/dL — ABNORMAL HIGH (ref 70–99)
Potassium: 4.5 mmol/L (ref 3.5–5.1)
Sodium: 134 mmol/L — ABNORMAL LOW (ref 135–145)

## 2021-06-15 LAB — CBC
HCT: 35.4 % — ABNORMAL LOW (ref 39.0–52.0)
Hemoglobin: 11.3 g/dL — ABNORMAL LOW (ref 13.0–17.0)
MCH: 30.7 pg (ref 26.0–34.0)
MCHC: 31.9 g/dL (ref 30.0–36.0)
MCV: 96.2 fL (ref 80.0–100.0)
Platelets: 125 10*3/uL — ABNORMAL LOW (ref 150–400)
RBC: 3.68 MIL/uL — ABNORMAL LOW (ref 4.22–5.81)
RDW: 13.3 % (ref 11.5–15.5)
WBC: 6 10*3/uL (ref 4.0–10.5)
nRBC: 0 % (ref 0.0–0.2)

## 2021-06-15 SURGERY — HEMIARTHROPLASTY, HIP, DIRECT ANTERIOR APPROACH, FOR FRACTURE
Anesthesia: General | Site: Hip | Laterality: Left

## 2021-06-15 MED ORDER — PROPOFOL 10 MG/ML IV BOLUS
INTRAVENOUS | Status: DC | PRN
Start: 2021-06-15 — End: 2021-06-15
  Administered 2021-06-15: 20 mg via INTRAVENOUS

## 2021-06-15 MED ORDER — 0.9 % SODIUM CHLORIDE (POUR BTL) OPTIME
TOPICAL | Status: DC | PRN
  Administered 2021-06-15: 1000 mL

## 2021-06-15 MED ORDER — FENTANYL CITRATE (PF) 250 MCG/5ML IJ SOLN
INTRAMUSCULAR | Status: DC | PRN
  Administered 2021-06-15 (×2): 25 ug via INTRAVENOUS
  Administered 2021-06-15: 50 ug via INTRAVENOUS

## 2021-06-15 MED ORDER — BUPIVACAINE HCL 0.25 % IJ SOLN
INTRAMUSCULAR | Status: DC | PRN
  Administered 2021-06-15: 30 mL

## 2021-06-15 MED ORDER — SUGAMMADEX SODIUM 200 MG/2ML IV SOLN
INTRAVENOUS | Status: DC | PRN
  Administered 2021-06-15: 150 mg via INTRAVENOUS

## 2021-06-15 MED ORDER — PROPOFOL 10 MG/ML IV BOLUS
INTRAVENOUS | Status: AC
  Filled 2021-06-15: qty 20

## 2021-06-15 MED ORDER — FENTANYL CITRATE (PF) 100 MCG/2ML IJ SOLN: 25.0000 ug | INTRAMUSCULAR | Status: DC | PRN

## 2021-06-15 MED ORDER — SODIUM CHLORIDE 0.9 % IR SOLN
Status: DC | PRN
  Administered 2021-06-15: 3000 mL

## 2021-06-15 MED ORDER — FENTANYL CITRATE (PF) 250 MCG/5ML IJ SOLN
INTRAMUSCULAR | Status: AC
  Filled 2021-06-15: qty 5

## 2021-06-15 MED ORDER — OXYCODONE HCL 5 MG/5ML PO SOLN: 5.0000 mg | Freq: Once | ORAL | Status: DC | PRN

## 2021-06-15 MED ORDER — PHENYLEPHRINE 40 MCG/ML (10ML) SYRINGE FOR IV PUSH (FOR BLOOD PRESSURE SUPPORT)
PREFILLED_SYRINGE | INTRAVENOUS | Status: AC
  Filled 2021-06-15: qty 10

## 2021-06-15 MED ORDER — LIDOCAINE 2% (20 MG/ML) 5 ML SYRINGE
INTRAMUSCULAR | Status: DC | PRN
  Administered 2021-06-15: 50 mg via INTRAVENOUS

## 2021-06-15 MED ORDER — OXYCODONE HCL 5 MG PO TABS: 5.0000 mg | ORAL_TABLET | Freq: Once | ORAL | Status: DC | PRN

## 2021-06-15 MED ORDER — ROCURONIUM BROMIDE 10 MG/ML (PF) SYRINGE
PREFILLED_SYRINGE | INTRAVENOUS | Status: DC | PRN
Start: 2021-06-15 — End: 2021-06-15
  Administered 2021-06-15: 50 mg via INTRAVENOUS

## 2021-06-15 MED ORDER — ENOXAPARIN SODIUM 40 MG/0.4ML IJ SOSY
40.0000 mg | PREFILLED_SYRINGE | INTRAMUSCULAR | Status: DC
  Administered 2021-06-16 – 2021-06-20 (×5): 40 mg via SUBCUTANEOUS
  Filled 2021-06-15 (×5): qty 0.4

## 2021-06-15 MED ORDER — OXYCODONE HCL 5 MG PO TABS
2.5000 mg | ORAL_TABLET | Freq: Four times a day (QID) | ORAL | Status: DC | PRN
  Administered 2021-06-19: 5 mg via ORAL
  Filled 2021-06-15: qty 1

## 2021-06-15 MED ORDER — SURGIRINSE WOUND IRRIGATION SYSTEM - OPTIME: TOPICAL | Status: DC | PRN

## 2021-06-15 MED ORDER — LIDOCAINE 2% (20 MG/ML) 5 ML SYRINGE
INTRAMUSCULAR | Status: AC
  Filled 2021-06-15: qty 5

## 2021-06-15 MED ORDER — ONDANSETRON HCL 4 MG/2ML IJ SOLN
INTRAMUSCULAR | Status: AC
  Filled 2021-06-15: qty 2

## 2021-06-15 MED ORDER — PHENYLEPHRINE 40 MCG/ML (10ML) SYRINGE FOR IV PUSH (FOR BLOOD PRESSURE SUPPORT)
PREFILLED_SYRINGE | INTRAVENOUS | Status: DC | PRN
  Administered 2021-06-15: 40 ug via INTRAVENOUS
  Administered 2021-06-15 (×4): 80 ug via INTRAVENOUS
  Administered 2021-06-15: 40 ug via INTRAVENOUS

## 2021-06-15 MED ORDER — PHENYLEPHRINE HCL-NACL 20-0.9 MG/250ML-% IV SOLN
INTRAVENOUS | Status: DC | PRN
  Administered 2021-06-15: 80 ug/min via INTRAVENOUS

## 2021-06-15 MED ORDER — ROCURONIUM BROMIDE 10 MG/ML (PF) SYRINGE
PREFILLED_SYRINGE | INTRAVENOUS | Status: AC
  Filled 2021-06-15: qty 10

## 2021-06-15 MED ORDER — HYDRALAZINE HCL 10 MG PO TABS: 10.0000 mg | ORAL_TABLET | Freq: Four times a day (QID) | ORAL | Status: DC | PRN

## 2021-06-15 MED ORDER — DEXAMETHASONE SODIUM PHOSPHATE 10 MG/ML IJ SOLN
INTRAMUSCULAR | Status: AC
  Filled 2021-06-15: qty 1

## 2021-06-15 MED ORDER — ACETAMINOPHEN 10 MG/ML IV SOLN
INTRAVENOUS | Status: DC | PRN
  Administered 2021-06-15: 1000 mg via INTRAVENOUS

## 2021-06-15 MED ORDER — VANCOMYCIN HCL 1000 MG IV SOLR
INTRAVENOUS | Status: AC
  Filled 2021-06-15: qty 20

## 2021-06-15 MED ORDER — ACETAMINOPHEN 500 MG PO TABS
1000.0000 mg | ORAL_TABLET | Freq: Three times a day (TID) | ORAL | Status: DC
  Administered 2021-06-15 – 2021-06-20 (×12): 1000 mg via ORAL
  Filled 2021-06-15 (×14): qty 2

## 2021-06-15 MED ORDER — BUPIVACAINE HCL (PF) 0.25 % IJ SOLN
INTRAMUSCULAR | Status: AC
  Filled 2021-06-15: qty 30

## 2021-06-15 MED ORDER — DEXAMETHASONE SODIUM PHOSPHATE 10 MG/ML IJ SOLN
INTRAMUSCULAR | Status: DC | PRN
  Administered 2021-06-15: 5 mg via INTRAVENOUS

## 2021-06-15 MED ORDER — TRANEXAMIC ACID-NACL 1000-0.7 MG/100ML-% IV SOLN
INTRAVENOUS | Status: AC
  Filled 2021-06-15: qty 100

## 2021-06-15 MED ORDER — ONDANSETRON HCL 4 MG/2ML IJ SOLN: 4.0000 mg | Freq: Four times a day (QID) | INTRAMUSCULAR | Status: DC | PRN

## 2021-06-15 MED ORDER — ACETAMINOPHEN 325 MG PO TABS: 650.0000 mg | ORAL_TABLET | Freq: Four times a day (QID) | ORAL | Status: DC | PRN

## 2021-06-15 MED ORDER — ONDANSETRON HCL 4 MG/2ML IJ SOLN
INTRAMUSCULAR | Status: DC | PRN
  Administered 2021-06-15: 4 mg via INTRAVENOUS

## 2021-06-15 MED ORDER — ALBUMIN HUMAN 5 % IV SOLN: INTRAVENOUS | Status: DC | PRN

## 2021-06-15 SURGICAL SUPPLY — 64 items
ADH SKN CLS APL DERMABOND .7 (GAUZE/BANDAGES/DRESSINGS) ×1
APL PRP STRL LF DISP 70% ISPRP (MISCELLANEOUS) ×2
BRUSH FEMORAL CANAL (MISCELLANEOUS) IMPLANT
CEMENT BONE SIMPLEX SPEEDSET (Cement) ×2 IMPLANT
CHLORAPREP W/TINT 26 (MISCELLANEOUS) ×4 IMPLANT
COVER SURGICAL LIGHT HANDLE (MISCELLANEOUS) ×2 IMPLANT
DERMABOND ADVANCED (GAUZE/BANDAGES/DRESSINGS) ×1
DERMABOND ADVANCED .7 DNX12 (GAUZE/BANDAGES/DRESSINGS) ×2 IMPLANT
DRAPE HALF SHEET 40X57 (DRAPES) ×4 IMPLANT
DRAPE HIP W/POCKET STRL (MISCELLANEOUS) ×2 IMPLANT
DRAPE IMP U-DRAPE 54X76 (DRAPES) ×2 IMPLANT
DRAPE INCISE IOBAN 66X45 STRL (DRAPES) ×2 IMPLANT
DRAPE INCISE IOBAN 85X60 (DRAPES) ×2 IMPLANT
DRAPE POUCH INSTRU U-SHP 10X18 (DRAPES) ×2 IMPLANT
DRAPE SURG 17X11 SM STRL (DRAPES) ×2 IMPLANT
DRAPE U-SHAPE 47X51 STRL (DRAPES) ×2 IMPLANT
DRESSING AQUACEL AG SP 3.5X10 (GAUZE/BANDAGES/DRESSINGS) IMPLANT
DRSG AQUACEL AG SP 3.5X10 (GAUZE/BANDAGES/DRESSINGS) ×2
ELECT BLADE 4.0 EZ CLEAN MEGAD (MISCELLANEOUS) ×2
ELECT REM PT RETURN 15FT ADLT (MISCELLANEOUS) ×2 IMPLANT
ELECTRODE BLDE 4.0 EZ CLN MEGD (MISCELLANEOUS) ×1 IMPLANT
FEMORAL HEAD LFIT V40 28MM P4 (Head) ×1 IMPLANT
GLOVE SRG 8 PF TXTR STRL LF DI (GLOVE) ×1 IMPLANT
GLOVE SURG ORTHO LTX SZ8 (GLOVE) ×4 IMPLANT
GLOVE SURG UNDER POLY LF SZ8 (GLOVE) ×2
GOWN STRL REUS W/ TWL XL LVL3 (GOWN DISPOSABLE) ×1 IMPLANT
GOWN STRL REUS W/TWL XL LVL3 (GOWN DISPOSABLE) ×2
HANDPIECE INTERPULSE COAX TIP (DISPOSABLE)
HEAD BIPOLAR LOCK UHR 28X52 (Head) ×1 IMPLANT
HOOD PEEL AWAY FLYTE STAYCOOL (MISCELLANEOUS) ×6 IMPLANT
IRRIGATION SURGIPHOR STRL (IV SOLUTION) IMPLANT
KIT BASIN OR (CUSTOM PROCEDURE TRAY) ×2 IMPLANT
KIT TURNOVER KIT A (KITS) ×2 IMPLANT
MANIFOLD NEPTUNE II (INSTRUMENTS) ×2 IMPLANT
MARKER SKIN DUAL TIP RULER LAB (MISCELLANEOUS) ×2 IMPLANT
NDL 18GX1X1/2 (RX/OR ONLY) (NEEDLE) ×1 IMPLANT
NEEDLE 18GX1X1/2 (RX/OR ONLY) (NEEDLE) ×2 IMPLANT
NS IRRIG 1000ML POUR BTL (IV SOLUTION) ×2 IMPLANT
PACK TOTAL JOINT (CUSTOM PROCEDURE TRAY) ×2 IMPLANT
RETRIEVER SUT HEWSON (MISCELLANEOUS) ×2 IMPLANT
SEALER BIPOLAR AQUA 6.0 (INSTRUMENTS) ×2 IMPLANT
SET HNDPC FAN SPRY TIP SCT (DISPOSABLE) IMPLANT
SET INTERPULSE LAVAGE W/TIP (ORTHOPEDIC DISPOSABLE SUPPLIES) ×2 IMPLANT
SPACER CENTRAL ACCOLADE 4/5X14 (Spacer) ×1 IMPLANT
SPONGE T-LAP 18X18 ~~LOC~~+RFID (SPONGE) ×4 IMPLANT
STAPLER VISISTAT 35W (STAPLE) ×2 IMPLANT
STEM HIP ACCOLADE SZ5 37X145 (Stem) ×1 IMPLANT
STOCKINETTE IMPERVIOUS LG (DRAPES) IMPLANT
SUCTION FRAZIER HANDLE 10FR (MISCELLANEOUS) ×2
SUCTION TUBE FRAZIER 10FR DISP (MISCELLANEOUS) ×1 IMPLANT
SUT ETHIBOND 2 V 37 (SUTURE) ×2 IMPLANT
SUT MNCRL AB 3-0 PS2 18 (SUTURE) ×2 IMPLANT
SUT VIC AB 0 CT1 27 (SUTURE) ×4
SUT VIC AB 0 CT1 27XBRD ANBCTR (SUTURE) ×2 IMPLANT
SUT VIC AB 1 CT1 27 (SUTURE) ×4
SUT VIC AB 1 CT1 27XBRD ANBCTR (SUTURE) ×2 IMPLANT
SUT VIC AB 2-0 CT2 27 (SUTURE) ×4 IMPLANT
SUT VLOC 180 0 24IN GS25 (SUTURE) ×2 IMPLANT
SYR 20ML LL LF (SYRINGE) ×2 IMPLANT
TOWEL GREEN STERILE (TOWEL DISPOSABLE) ×2 IMPLANT
TOWER CARTRIDGE SMART MIX (DISPOSABLE) IMPLANT
TRAY FOLEY MTR SLVR 16FR STAT (SET/KITS/TRAYS/PACK) ×2 IMPLANT
TUBE SUCT ARGYLE STRL (TUBING) ×2 IMPLANT
WATER STERILE IRR 1000ML POUR (IV SOLUTION) ×2 IMPLANT
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
## 2021-06-15 NOTE — Progress Notes (Signed)
PROGRESS NOTE  [name] HQP:[id number] DOB: 05/15/1926   PCP: [name], NP  Patient is from: Home.  Lives with family.  DOA: 06/14/2021 LOS: 1  Chief complaints:  Chief Complaint  Patient presents with   Fall     Brief Narrative / Interim history: 88 year old M with PMH of dementia, HTN not on meds, BPH/TURP, chronic urinary tension with indwelling Foley, recurrent UTIs, OSA on CPAP and CKD-3 brought to ED with left hip and left shoulder pain after fall the night prior when he got up unassisted, and found to have left femoral neck fracture.  Orthopedic surgery consulted.  He was started on IV ceftriaxone.  Patient underwent bipolar hip hemiarthroplasty by Dr. Zachery Dakins on 06/15/2021.  Subjective: Seen and examined this afternoon after he returned from surgery.  Patient's wife at bedside.  Patient is sleepy but wakes to voice.  Not a reliable historian.  Does not appear to be in pain or distress.  Objective: Vitals:   06/15/21 1145 06/15/21 1210 06/15/21 1213 06/15/21 1300  BP: 114/80  (!) 136/119 (!) 140/94  Pulse: 74 77 62 88  Resp: 10  17 12   Temp:  97.7 F (36.5 C) 97.7 F (36.5 C)   TempSrc:  Oral    SpO2: 99%  91% 97%  Weight:      Height:        Examination:  GENERAL: No apparent distress.  Nontoxic. HEENT: MMM.  Vision and hearing grossly intact.  NECK: Supple.  No apparent JVD.  RESP: 97% on RA.  No IWOB.  Fair aeration bilaterally. CVS:  RRR. Heart sounds normal.  ABD/GI/GU: BS+. Abd soft, NTND.  MSK/EXT:  Moves extremities.  Significant muscle mass and subcu fat loss. SKIN: Dressing over surgical site DCI. NEURO: Sleepy but briefly wakes to voice.  Not able to assess orientation.  No apparent focal neuro deficit but limited exam due to mental status.Marland Kitchen PSYCH: Calm and sleepy.  Procedures:  06/15/2021-bipolar hemiarthroplasty of left femoral neck fracture  Microbiology summarized: COVID-19 and influenza PCR nonreactive.  Assessment &  Plan: Accidental fall at home Left hip fracture/closed left femoral neck fracture -Status post ORIF as above. -Pain control with scheduled Tylenol and as needed oxycodone -Bowel regimen and VTE prophylaxis  Catheter associated UTI and patient with history of chronic Foley due to chronic urinary tension/BPH -[facility] where Foley was placed on 06/07/2021. -Urine culture on 12/27 grew Citrobacter and Serratia and he was started on 10 days course of Bactrim on 12/30 -IV ceftriaxone while in-house  Dementia with behavioral disturbance-increased confusion and hallucination lately.  CT head without acute finding. -Treat UTI as above -Reorientation and delirium precautions  CKD-3A: At baseline. Recent Labs    06/16/20 0000 09/21/20 1343 02/21/21 1140 06/14/21 1027 06/15/21 0235  BUN 20 21 19 14 12   CREATININE 1.34* 1.18* 1.14 1.51* 1.41*  -Monitor intermittently  Essential hypertension: Does not seem to be on medication at home. -P.o. hydralazine 10 mg 3 times daily as needed  OSA on CPAP: Patient's wife concerned about CPAP worsening dementia and anemia.  Reassured about this and recommended continuing CPAP as long as he tolerates.  -Nightly CPAP as tolerated   Normocytic anemia: Slight drop in Hgb likely dilutional from IV fluid Recent Labs    06/16/20 0000 09/21/20 1343 02/21/21 1140 06/14/21 1027 06/15/21 0235  HGB 12.9* 12.2* 12.5* 13.5 11.3*  -Monitor H&H   Thrombocytopenia: Monitor. Recent Labs  Lab 06/14/21 1027 06/15/21 0235  PLT 163 125*  -Monitor.   Body mass index is 20.75 kg/m.         DVT prophylaxis:  enoxaparin (LOVENOX) injection 40 mg Start: 06/16/21 0900 SCDs Start: 06/14/21 1952  Code Status: Full code Family Communication: Updated patient's wife at bedside. Level of care: Med-Surg Status is: Inpatient  Remains inpatient appropriate because: Postop care after left hip fracture, UTI and dementia with behavioral  disturbance   Final disposition: TBD    Consultants:  Orthopedic surgery   Sch Meds:  Scheduled Meds:  brimonidine  1 drop Left Eye BID   Chlorhexidine Gluconate Cloth  6 each Topical Daily   dorzolamide-timolol  1 drop Left Eye BID   [START ON 06/16/2021] enoxaparin  40 mg Subcutaneous Q24H   Continuous Infusions:  cefTRIAXone (ROCEPHIN)  IV 1 g (06/15/21 1314)   PRN Meds:.acetaminophen, melatonin, morphine injection, oxyCODONE, senna-docusate  Antimicrobials: Anti-infectives (From admission, onward)    Start     Dose/Rate Route Frequency Ordered Stop   06/15/21 1300  cefTRIAXone (ROCEPHIN) 1 g in sodium chloride 0.9 % 100 mL IVPB        1 g 200 mL/hr over 30 Minutes Intravenous Every 24 hours 06/14/21 1954     06/15/21 0600  ceFAZolin (ANCEF) IVPB 2g/100 mL premix        2 g 200 mL/hr over 30 Minutes Intravenous On call to O.R. 06/14/21 2047 06/15/21 0900   06/14/21 1245  cefTRIAXone (ROCEPHIN) 2 g in sodium chloride 0.9 % 100 mL IVPB        2 g 200 mL/hr over 30 Minutes Intravenous  Once 06/14/21 1235 06/14/21 1332        I have personally reviewed the following labs and images: CBC: Recent Labs  Lab 06/14/21 1027 06/15/21 0235  WBC 7.1 6.0  NEUTROABS 5.8  --   HGB 13.5 11.3*  HCT 41.1 35.4*  MCV 95.4 96.2  PLT 163 125*   BMP &GFR Recent Labs  Lab 06/14/21 1027 06/15/21 0235  NA 134* 134*  K 3.9 4.5  CL 98 101  CO2 25 24  GLUCOSE 118* 114*  BUN 14 12  CREATININE 1.51* 1.41*  CALCIUM 9.3 8.8*   Estimated Creatinine Clearance: 27.8 mL/min (A) (by C-G formula based on SCr of 1.41 mg/dL (H)). Liver & Pancreas: Recent Labs  Lab 06/14/21 1027  AST 27  ALT 17  ALKPHOS 177*  BILITOT 0.7  PROT 7.7  ALBUMIN 3.8   No results for input(s): LIPASE, AMYLASE in the last 168 hours. No results for input(s): AMMONIA in the last 168 hours. Diabetic: No results for input(s): HGBA1C in the last 72 hours. No results for input(s): GLUCAP in the last 168  hours. Cardiac Enzymes: No results for input(s): CKTOTAL, CKMB, CKMBINDEX, TROPONINI in the last 168 hours. No results for input(s): PROBNP in the last 8760 hours. Coagulation Profile: No results for input(s): INR, PROTIME in the last 168 hours. Thyroid Function Tests: No results for input(s): TSH, T4TOTAL, FREET4, T3FREE, THYROIDAB in the last 72 hours. Lipid Profile: No results for input(s): CHOL, HDL, LDLCALC, TRIG, CHOLHDL, LDLDIRECT in the last 72 hours. Anemia Panel: No results for input(s): VITAMINB12, FOLATE, FERRITIN, TIBC, IRON, RETICCTPCT in the last 72 hours. Urine analysis:    Component Value Date/Time   COLORURINE YELLOW 06/14/2021 1220   APPEARANCEUR CLEAR 06/14/2021 1220   LABSPEC 1.015 06/14/2021 1220   PHURINE 7.5 06/14/2021 1220   GLUCOSEU NEGATIVE 06/14/2021 [place] 01/04/2017 1025  HGBUR NEGATIVE 06/14/2021 1220   BILIRUBINUR NEGATIVE 06/14/2021 1220   KETONESUR NEGATIVE 06/14/2021 1220   PROTEINUR NEGATIVE 06/14/2021 1220   UROBILINOGEN 0.2 01/04/2017 1025   NITRITE NEGATIVE 06/14/2021 1220   LEUKOCYTESUR SMALL (A) 06/14/2021 1220   Sepsis Labs: Invalid input(s): PROCALCITONIN, [place]  Microbiology: Recent Results (from the past 240 hour(s))  Resp Panel by RT-PCR (Flu A&B, Covid) Peripheral     Status: None   Collection Time: 06/14/21 10:27 AM   Specimen: Peripheral; Nasopharyngeal(NP) swabs in vial transport medium  Result Value Ref Range Status   SARS Coronavirus 2 by RT PCR NEGATIVE NEGATIVE Final    Comment: (NOTE) SARS-CoV-2 target nucleic acids are NOT DETECTED.  The SARS-CoV-2 RNA is generally detectable in upper respiratory specimens during the acute phase of infection. The lowest concentration of SARS-CoV-2 viral copies this assay can detect is 138 copies/mL. A negative result does not preclude SARS-Cov-2 infection and should not be used as the sole basis for treatment or other patient management decisions. A  negative result may occur with  improper specimen collection/handling, submission of specimen other than nasopharyngeal swab, presence of viral mutation(s) within the areas targeted by this assay, and inadequate number of viral copies(<138 copies/mL). A negative result must be combined with clinical observations, patient history, and epidemiological information. The expected result is Negative.  Fact Sheet for Patients:  EntrepreneurPulse.com.au  Fact Sheet for Healthcare Providers:  IncredibleEmployment.be  This test is no t yet approved or cleared by the [place] FDA and  has been authorized for detection and/or diagnosis of SARS-CoV-2 by FDA under an Emergency Use Authorization (EUA). This EUA will remain  in effect (meaning this test can be used) for the duration of the COVID-19 declaration under Section 564(b)(1) of the Act, 21 U.S.C.section 360bbb-3(b)(1), unless the authorization is terminated  or revoked sooner.       Influenza A by PCR NEGATIVE NEGATIVE Final   Influenza B by PCR NEGATIVE NEGATIVE Final    Comment: (NOTE) The Xpert Xpress SARS-CoV-2/FLU/RSV plus assay is intended as an aid in the diagnosis of influenza from Nasopharyngeal swab specimens and should not be used as a sole basis for treatment. Nasal washings and aspirates are unacceptable for Xpert Xpress SARS-CoV-2/FLU/RSV testing.  Fact Sheet for Patients: EntrepreneurPulse.com.au  Fact Sheet for Healthcare Providers: IncredibleEmployment.be  This test is not yet approved or cleared by the [place] FDA and has been authorized for detection and/or diagnosis of SARS-CoV-2 by FDA under an Emergency Use Authorization (EUA). This EUA will remain in effect (meaning this test can be used) for the duration of the COVID-19 declaration under Section 564(b)(1) of the Act, 21 U.S.C. section 360bbb-3(b)(1), unless the authorization  is terminated or revoked.  Performed at [facility], [address] [place], [place] [address]   Blood culture (routine x 2)     Status: None (Preliminary result)   Collection Time: 06/14/21 10:28 AM   Specimen: BLOOD  Result Value Ref Range Status   Specimen Description   Final    BLOOD Blood Culture adequate volume Performed at [facility], [address] [place], [place] [address]    Special Requests   Final    BOTTLES DRAWN AEROBIC AND ANAEROBIC LEFT ANTECUBITAL Performed at [facility], [address] [place], [place] [address]    Culture   Final    NO GROWTH < 24 HOURS Performed at [facility], [place] [address],  [place], [place] [address]    Report Status PENDING  Incomplete  Urine Culture     Status: None (Preliminary result)   Collection Time: 06/14/21 12:20 PM   Specimen: Urine, Clean Catch  Result Value Ref Range Status   Specimen Description   Final    URINE, CLEAN CATCH Performed at [facility], [address] [place], [place] [address]    Special Requests   Final    NONE Performed at [facility], [address] [place], [place] [address]    Culture   Final    CULTURE REINCUBATED FOR BETTER GROWTH Performed at [facility], [place] [address] [place], [place] [address]    Report Status PENDING  Incomplete  Blood culture (routine x 2)     Status: None (Preliminary result)   Collection Time: 06/14/21  6:42 PM   Specimen: BLOOD  Result Value Ref Range Status   Specimen Description BLOOD SITE NOT SPECIFIED  Final   Special Requests   Final    BOTTLES DRAWN AEROBIC AND ANAEROBIC Blood Culture adequate volume   Culture   Final    NO GROWTH < 12 HOURS Performed at [facility], [place] [address], [place], [place] [address]    Report Status PENDING  Incomplete  Surgical pcr screen     Status: None   Collection Time: 06/14/21  8:45 PM   Specimen: Nasal Mucosa; Nasal Swab  Result Value  Ref Range Status   MRSA, PCR NEGATIVE NEGATIVE Final   Staphylococcus aureus NEGATIVE NEGATIVE Final    Comment: (NOTE) The Xpert SA Assay (FDA approved for NASAL specimens in patients 38 years of age and older), is one component of a comprehensive surveillance program. It is not intended to diagnose infection nor to guide or monitor treatment. Performed at [facility], [place] [address] [place], [place] [address]     Radiology Studies: DG HIP OPERATIVE UNILAT W OR W/O PELVIS LEFT  Result Date: 06/15/2021 CLINICAL DATA:  88 year old male status post left hip fracture and replacement. EXAM: OPERATIVE choose 1 HIP (WITH PELVIS IF PERFORMED) 3 VIEWS TECHNIQUE: Postoperative radiographs, Portable AP supine view and cross-table lateral. COMPARISON:  06/14/2021. FINDINGS: Bipolar type left hip arthroplasty. Hardware components appear intact and normally aligned. No unexpected osseous changes. No new fracture identified. IMPRESSION: Left hip arthroplasty with no adverse features. Electronically Signed   By: Genevie Ann M.D.   On: 06/15/2021 11:51      Ellijah Leffel [place]  If 7PM-7AM, please contact night-coverage [email or web address] 06/15/2021, 5:05 PM

## 2021-06-15 NOTE — Anesthesia Preprocedure Evaluation (Signed)
Anesthesia Evaluation  Patient identified by MRN, date of birth, ID band Patient awake    Reviewed: Allergy & Precautions, H&P , NPO status , Patient's Chart, lab work & pertinent test results  Airway Mallampati: II   Neck ROM: full    Dental   Pulmonary sleep apnea , former smoker,    breath sounds clear to auscultation       Cardiovascular hypertension,  Rhythm:regular Rate:Normal  RBBB   Neuro/Psych  Headaches, PSYCHIATRIC DISORDERS Dementia    GI/Hepatic   Endo/Other    Renal/GU Renal InsufficiencyRenal disease     Musculoskeletal   Abdominal   Peds  Hematology   Anesthesia Other Findings   Reproductive/Obstetrics                             Anesthesia Physical Anesthesia Plan  ASA: 3  Anesthesia Plan: Spinal and MAC   Post-op Pain Management:    Induction: Intravenous  PONV Risk Score and Plan: 1 and Propofol infusion and Treatment may vary due to age or medical condition  Airway Management Planned: Simple Face Mask  Additional Equipment:   Intra-op Plan:   Post-operative Plan: Extubation in OR  Informed Consent: I have reviewed the patients History and Physical, chart, labs and discussed the procedure including the risks, benefits and alternatives for the proposed anesthesia with the patient or authorized representative who has indicated his/her understanding and acceptance.     Dental advisory given  Plan Discussed with: CRNA, Anesthesiologist and Surgeon  Anesthesia Plan Comments:         Anesthesia Quick Evaluation

## 2021-06-15 NOTE — Plan of Care (Signed)
°  Problem: Education: Goal: Knowledge of General Education information will improve Description: Including pain rating scale, medication(s)/side effects and non-pharmacologic comfort measures Outcome: Progressing   Problem: Health Behavior/Discharge Planning: Goal: Ability to manage health-related needs will improve Outcome: Progressing   Problem: Clinical Measurements: Goal: Ability to maintain clinical measurements within normal limits will improve Outcome: Progressing Goal: Will remain free from infection Outcome: Progressing Goal: Diagnostic test results will improve Outcome: Progressing Goal: Respiratory complications will improve Outcome: Progressing Goal: Cardiovascular complication will be avoided Outcome: Progressing   Problem: Clinical Measurements: Goal: Diagnostic test results will improve Outcome: Progressing   Problem: Clinical Measurements: Goal: Will remain free from infection Outcome: Progressing   Problem: Clinical Measurements: Goal: Respiratory complications will improve Outcome: Progressing   Problem: Elimination: Goal: Will not experience complications related to bowel motility Outcome: Progressing Goal: Will not experience complications related to urinary retention Outcome: Progressing   Problem: Safety: Goal: Ability to remain free from injury will improve Outcome: Progressing   Problem: Skin Integrity: Goal: Risk for impaired skin integrity will decrease Outcome: Progressing

## 2021-06-15 NOTE — Op Note (Addendum)
OP NOTE  06/15/2021  10:29 AM  PATIENT:  [name]   MRN: [id number]  PRE-OPERATIVE DIAGNOSIS:  left displaced femoral neck fracture  POST-OPERATIVE DIAGNOSIS:  left displaced femoral neck fracture  PROCEDURE:  Procedure(s): ARTHROPLASTY BIPOLAR HIP (HEMIARTHROPLASTY)  PREOPERATIVE INDICATIONS:  [name] is an 88 y.o. male who was admitted 06/14/2021 with a diagnosis of Fracture of femoral neck, left, closed ([place]) and elected for surgical management.  The risks benefits and alternatives were discussed with the patient including but not limited to the risks of nonoperative treatment, versus surgical intervention including infection, bleeding, nerve injury, periprosthetic fracture, the need for revision surgery, dislocation, leg length discrepancy, blood clots, cardiopulmonary complications, morbidity, mortality, among others, and they were willing to proceed.  Predicted outcome is good, although there will be at least a six to nine month expected recovery.   OPERATIVE REPORT     SURGEON:  Charlies Constable, MD    ASSISTANT:  Izola Price, RNFA  (Present throughout the entire procedure,  necessary for completion of procedure in a timely manner, assisting with retraction, instrumentation, and closure)     ANESTHESIA:  general  ESTIMATED BLOOD LOSS: 861UO    COMPLICATIONS:  None.   COMPONENTS:  Implant Name Type Inv. Item Serial No. Manufacturer Lot No. LRB No. Used Action  CEMENT BONE SIMPLEX SPEEDSET - [id number] Cement CEMENT BONE SIMPLEX SPEEDSET  [employer] [id number] Left 2 Implanted  SPACER CENTRAL ACCOLADE 4/5X14 - [id number] Spacer SPACER CENTRAL ACCOLADE 4/5X14  [employer] [id number] Left 1 Implanted  HEAD BIPOLAR LOCK UHR 28X52 - [id number] Head HEAD BIPOLAR LOCK UHR 28X52  [employer] [id number] Left 1 Implanted  STEM HIP ACCOLADE SZ5 37X145 - [id number] Stem STEM HIP ACCOLADE SZ5 37X145  [employer] [id number] Left 1 Implanted  FEMORAL HEAD LFIT V40  28MM P4 - [id number] Head FEMORAL HEAD LFIT V40 28MM P4  [employer]  Left 1 Implanted      PROCEDURE IN DETAIL: The patient was met in the holding area and identified.  The appropriate hip  was marked at the operative site. The patient was then transported to the OR and  placed under anesthesia.  At that point, the patient was  placed in the lateral decubitus position with the operative side up and  secured to the operating room table and all bony prominences padded. A subaxillary role was placed.    The operative lower extremity was prepped from the iliac crest to the ankle.  Sterile draping was performed.  Time out was performed prior to incision.      A routine posterolateral approach was utilized via sharp dissection  carried down to the subcutaneous tissue.  Gross bleeders were Bovie  coagulated.  The iliotibial band was identified and incised  along the length of the skin incision.  A Charnley retractor was inserted with care to protect the sciatic nerve.  With the hip internally rotated, the short external rotators  were identified. The piriformis was tagged with #2 Ethibond, and the hip capsule released in a T-type fashion, and posterior sleeve of the capsule was also tagged.  The femoral neck was exposed, and I resected the femoral neck using the appropriate jig. This was performed at approximately a thumb's breadth above the lesser trochanter just distal to the fracture    I then exposed the deep acetabulum, cleared out any tissue including the ligamentum teres.    I then prepared the proximal femur using the box cutter, Charnley awl, and then  sequentially broached.  A trial utilized, and I reduced the hip, leg lengths were assessed clinically and felt to be equal. The hip was then taken through a full range of motion, the hip was stable at full extension and 90 degrees external rotation without anterior subluxation. The hip was also stable in the position of sleep, and in neutral  abduction up to 90 degrees flexion, and 70 degrees IR. The trial components were then removed.   We then prepared canal for cementation.  The cement restrictor was measured and inserted distally.  The canal was then irrigated with the pulse lavage and 3 L of normal saline.  2 bags of Simplex cement were prepared.  Using the cement gun the cement was inserted distally and the canal was filled.  We then pressurized the canal. The real implant was then inserted matching the patient's native anteversion of approximately 30 degrees.  We then waited for 13 minutes for the cement to be fully set.  Excess cement was removed.  A lap was placed in the acetabulum prior to cementing was also removed and the acetabulum was assessed to make sure there was no cement or bone fragments.  The hip was then reduced with the trial head again and taken through functional range of motion and found to have excellent stability. Leg lengths were restored. The real head was then impacted onto the stem and the hip was again reduced.  The capsule was then repaired with #2 Ethibond., and the piriformis was repaired to the abductor tendon.  His. Excellent posterior capsular repair was achieved.   I then irrigated the hip copiously again with pulse lavage as well as surgiphor given his high infection risk. The wounds were injected with 0.25% marcaine plaine. The fascia and IT band was repaired with #1 vicryl, followed by 0 vicryl for the fat layer followed by 2-0 Vicryl and running 3-0 Monocryl for the skin, Dermabond was applied and an Aquacel dressing was placed.  The patient was then awakened and returned to PACU in stable and satisfactory condition. There were no complications.  Post op recs: WB: WBAT with posterior hip precautions x6 weeks Abx: will continue ceftriaxone post op that he his getting for his UTI Imaging: PACU xrays Dressing: Aquacel dressing to be kept intact until follow-up DVT prophylaxis: lovenox starting POD1  x4 weeks Follow up: 2 weeks after surgery for a wound check with Dr. Zachery Dakins at [facility].  Address: [address] [place], [place], [place] [address]  Office Phone: [phone]   Charlies Constable, MD Orthopedic Surgeon  06/15/2021 10:29 AM

## 2021-06-15 NOTE — Transfer of Care (Signed)
Immediate Anesthesia Transfer of Care Note  Patient: Carron Curie  Procedure(s) Performed: ARTHROPLASTY BIPOLAR HIP (HEMIARTHROPLASTY) (Left: Hip)  Patient Location: PACU  Anesthesia Type:General  Level of Consciousness: drowsy, patient cooperative and responds to stimulation  Airway & Oxygen Therapy: Patient Spontanous Breathing and Patient connected to face mask oxygen  Post-op Assessment: Report given to RN and Post -op Vital signs reviewed and stable  Post vital signs: Reviewed and stable  Last Vitals:  Vitals Value Taken Time  BP 139/75 06/15/21 1100  Temp    Pulse 34 06/15/21 1059  Resp 13 06/15/21 1100  SpO2 90 % 06/15/21 1059  Vitals shown include unvalidated device data.  Last Pain:  Vitals:   06/15/21 0331  TempSrc: Axillary  PainSc:          Complications: No notable events documented.

## 2021-06-15 NOTE — TOC CAGE-AID Note (Signed)
Transition of Care [facility]) - CAGE-AID Screening   Patient Details  Name: [name] MRN: [id number] Date of Birth: 1925-12-06  Transition of Care [facility]) CM/SW Contact:    Sarrah Fiorenza C Tarpley-Carter, [place] Phone Number: 06/15/2021, 2:03 PM   Clinical Narrative: Pt is unable to participate in Cage Aid.  Pt is not appropriate for assessment.  Anne Boltz Tarpley-Carter, MSW, LCSW-A Pronouns:  She/Her/Hers [place] Transitions of Care Clinical Social Worker Direct Number:  [phone] [email or web address]   CAGE-AID Screening: Substance Abuse Screening unable to be completed due to: : Patient unable to participate (Pt is not appropriate for assessment.)             Substance Abuse Education Offered: No

## 2021-06-16 ENCOUNTER — Inpatient Hospital Stay (HOSPITAL_COMMUNITY): Payer: Medicare Other

## 2021-06-16 ENCOUNTER — Encounter (HOSPITAL_COMMUNITY): Payer: Self-pay | Admitting: Orthopedic Surgery

## 2021-06-16 DIAGNOSIS — I951 Orthostatic hypotension: Secondary | ICD-10-CM

## 2021-06-16 DIAGNOSIS — E43 Unspecified severe protein-calorie malnutrition: Secondary | ICD-10-CM | POA: Diagnosis present

## 2021-06-16 DIAGNOSIS — I959 Hypotension, unspecified: Secondary | ICD-10-CM

## 2021-06-16 LAB — BLOOD GAS, ARTERIAL
Acid-Base Excess: 1.3 mmol/L (ref 0.0–2.0)
Bicarbonate: 25.8 mmol/L (ref 20.0–28.0)
FIO2: 24
O2 Saturation: 96.6 %
Patient temperature: 36.7
pCO2 arterial: 43.3 mmHg (ref 32.0–48.0)
pH, Arterial: 7.391 (ref 7.350–7.450)
pO2, Arterial: 89.7 mmHg (ref 83.0–108.0)

## 2021-06-16 LAB — IRON AND TIBC
Iron: 25 ug/dL — ABNORMAL LOW (ref 45–182)
Saturation Ratios: 12 % — ABNORMAL LOW (ref 17.9–39.5)
TIBC: 207 ug/dL — ABNORMAL LOW (ref 250–450)
UIBC: 182 ug/dL

## 2021-06-16 LAB — CBC
HCT: 35.7 % — ABNORMAL LOW (ref 39.0–52.0)
HCT: 36 % — ABNORMAL LOW (ref 39.0–52.0)
Hemoglobin: 11.5 g/dL — ABNORMAL LOW (ref 13.0–17.0)
Hemoglobin: 11.6 g/dL — ABNORMAL LOW (ref 13.0–17.0)
MCH: 30.9 pg (ref 26.0–34.0)
MCH: 31.5 pg (ref 26.0–34.0)
MCHC: 32.2 g/dL (ref 30.0–36.0)
MCHC: 32.2 g/dL (ref 30.0–36.0)
MCV: 96 fL (ref 80.0–100.0)
MCV: 97.8 fL (ref 80.0–100.0)
Platelets: 111 10*3/uL — ABNORMAL LOW (ref 150–400)
Platelets: 128 10*3/uL — ABNORMAL LOW (ref 150–400)
RBC: 3.72 MIL/uL — ABNORMAL LOW (ref 4.22–5.81)
RBC: 3.68 MIL/uL — ABNORMAL LOW (ref 4.22–5.81)
RDW: 13.2 % (ref 11.5–15.5)
RDW: 13.2 % (ref 11.5–15.5)
WBC: 9.2 10*3/uL (ref 4.0–10.5)
WBC: 8 10*3/uL (ref 4.0–10.5)
nRBC: 0 % (ref 0.0–0.2)
nRBC: 0 % (ref 0.0–0.2)

## 2021-06-16 LAB — RENAL FUNCTION PANEL
Albumin: 2.9 g/dL — ABNORMAL LOW (ref 3.5–5.0)
Anion gap: 7 (ref 5–15)
BUN: 16 mg/dL (ref 8–23)
CO2: 25 mmol/L (ref 22–32)
Calcium: 8.7 mg/dL — ABNORMAL LOW (ref 8.9–10.3)
Chloride: 104 mmol/L (ref 98–111)
Creatinine, Ser: 1.27 mg/dL — ABNORMAL HIGH (ref 0.61–1.24)
GFR, Estimated: 53 mL/min — ABNORMAL LOW (ref >60)
Glucose, Bld: 112 mg/dL — ABNORMAL HIGH (ref 70–99)
Phosphorus: 3.4 mg/dL (ref 2.5–4.6)
Potassium: 4.5 mmol/L (ref 3.5–5.1)
Sodium: 136 mmol/L (ref 135–145)

## 2021-06-16 LAB — TROPONIN I (HIGH SENSITIVITY)
Troponin I (High Sensitivity): 9 ng/L (ref <18)
Troponin I (High Sensitivity): 6 ng/L (ref <18)

## 2021-06-16 LAB — COMPREHENSIVE METABOLIC PANEL
ALT: 11 U/L (ref 0–44)
AST: 23 U/L (ref 15–41)
Albumin: 3.3 g/dL — ABNORMAL LOW (ref 3.5–5.0)
Alkaline Phosphatase: 119 U/L (ref 38–126)
Anion gap: 7 (ref 5–15)
BUN: 17 mg/dL (ref 8–23)
CO2: 25 mmol/L (ref 22–32)
Calcium: 8.6 mg/dL — ABNORMAL LOW (ref 8.9–10.3)
Chloride: 103 mmol/L (ref 98–111)
Creatinine, Ser: 1.32 mg/dL — ABNORMAL HIGH (ref 0.61–1.24)
GFR, Estimated: 50 mL/min — ABNORMAL LOW (ref >60)
Glucose, Bld: 107 mg/dL — ABNORMAL HIGH (ref 70–99)
Potassium: 4.2 mmol/L (ref 3.5–5.1)
Sodium: 135 mmol/L (ref 135–145)
Total Bilirubin: 0.9 mg/dL (ref 0.3–1.2)
Total Protein: 6.7 g/dL (ref 6.5–8.1)

## 2021-06-16 LAB — LACTIC ACID, PLASMA
Lactic Acid, Venous: 1.6 mmol/L (ref 0.5–1.9)
Lactic Acid, Venous: 1.4 mmol/L (ref 0.5–1.9)

## 2021-06-16 LAB — FOLATE: Folate: 14.9 ng/mL (ref >5.9)

## 2021-06-16 LAB — MAGNESIUM: Magnesium: 2 mg/dL (ref 1.7–2.4)

## 2021-06-16 LAB — RETICULOCYTES
Immature Retic Fract: 9 % (ref 2.3–15.9)
RBC.: 3.73 MIL/uL — ABNORMAL LOW (ref 4.22–5.81)
Retic Count, Absolute: 33.6 10*3/uL (ref 19.0–186.0)
Retic Ct Pct: 0.9 % (ref 0.4–3.1)

## 2021-06-16 LAB — TSH: TSH: 1.627 u[IU]/mL (ref 0.350–4.500)

## 2021-06-16 LAB — FERRITIN: Ferritin: 159 ng/mL (ref 24–336)

## 2021-06-16 LAB — VITAMIN B12: Vitamin B-12: 351 pg/mL (ref 180–914)

## 2021-06-16 LAB — GLUCOSE, CAPILLARY: Glucose-Capillary: 111 mg/dL — ABNORMAL HIGH (ref 70–99)

## 2021-06-16 MED ORDER — MIDODRINE HCL 5 MG PO TABS
5.0000 mg | ORAL_TABLET | Freq: Three times a day (TID) | ORAL | Status: DC
  Administered 2021-06-16 – 2021-06-17 (×3): 5 mg via ORAL
  Filled 2021-06-16 (×3): qty 1

## 2021-06-16 MED ORDER — ADULT MULTIVITAMIN W/MINERALS CH
1.0000 | ORAL_TABLET | Freq: Every day | ORAL | Status: DC
  Administered 2021-06-17 – 2021-06-20 (×4): 1 via ORAL
  Filled 2021-06-16 (×4): qty 1

## 2021-06-16 MED ORDER — ENSURE ENLIVE PO LIQD
237.0000 mL | Freq: Three times a day (TID) | ORAL | Status: DC
  Administered 2021-06-17 – 2021-06-20 (×8): 237 mL via ORAL

## 2021-06-16 MED ORDER — SODIUM CHLORIDE 0.9 % IV BOLUS
500.0000 mL | Freq: Once | INTRAVENOUS | Status: AC
  Administered 2021-06-16: 500 mL via INTRAVENOUS

## 2021-06-16 MED ORDER — SODIUM CHLORIDE 0.9 % IV SOLN
3.0000 g | Freq: Three times a day (TID) | INTRAVENOUS | Status: AC
  Administered 2021-06-16 – 2021-06-17 (×4): 3 g via INTRAVENOUS
  Filled 2021-06-16 (×4): qty 8

## 2021-06-16 MED ORDER — SODIUM CHLORIDE 0.9 % IV SOLN: INTRAVENOUS | Status: AC

## 2021-06-16 MED ORDER — SODIUM CHLORIDE 0.9 % IV BOLUS
1000.0000 mL | Freq: Once | INTRAVENOUS | Status: AC
  Administered 2021-06-16: 1000 mL via INTRAVENOUS

## 2021-06-16 NOTE — Progress Notes (Signed)
SLP Cancellation Note  Patient Details Name: [name] MRN: [id number] DOB: 05/08/1926   Cancelled treatment:       Reason Eval/Treat Not Completed: Patient's level of consciousness (Pt was approached for BSE. Pt's RN present and reported that pt's level of responsiveness has reduced and he has had increased O2 needs. Pt on CPAP while SLP in room and pt's RN further stated that crushed meds and applesauce needed to be removed from the oral cavity since pt would not swallow them. Pt's daughter stated that pt had consumed applesauce hours prior without difficulty, but agreed with staff's current concerns. Charge nurse arrived and contacted rapid response. SLP and charge nurse agreed with RN's plan to contact MD regarding an NPO status. SLP will follow up on subsequent date.)  [name] I. Hardin Negus, [place], [place] Office number [phone] Pager [place] 06/16/2021, 4:10 PM

## 2021-06-16 NOTE — Progress Notes (Signed)
RT NOTES: ABG obtained and sent to lab. Lab tech notified.  

## 2021-06-16 NOTE — Evaluation (Signed)
Physical Therapy Evaluation Patient Details Name: [name] MRN: [id number] DOB: 20-Dec-1925 Today's Date: 06/16/2021  History of Present Illness  Pt is a 88 y/o M presentign to ED on 1/3 after a fall at home, reporting L shoulder and L hip pain, found to have L femoral neck fracture, s/p hemiarthroplasty on 1/4. PMH includes dementia, HTN, CKD, and OSA.  Clinical Impression   Patient is s/p above surgery resulting in functional limitations due to the deficits listed below (see PT Problem List). Per daughter, pt requires assistance mod assistance with ADLs at home, uses RW and +1 assist for transfers and mobility around home. Pt lives with spouse, has family available for assistance. Currently pt requiring max A for ADLs, total A +2 for bed mobility and transfers. Pt BP dropping to 76/53 after squat pivot transfer to chair. Returned to bed, returned to 127/70 after in supine/[place] positions for ~10 minutes, RN aware. Reviewed WB and posterior hip precautions with pt's daughter and pt, however pt will need reinforcement. Pt presents with impairments listed below, will continue to follow acutely. Recommend SNF at d/c. Patient will benefit from skilled PT to increase their independence and safety with mobility to allow discharge to the venue listed below.          Recommendations for follow up therapy are one component of a multi-disciplinary discharge planning process, led by the attending physician.  Recommendations may be updated based on patient status, additional functional criteria and insurance authorization.  Follow Up Recommendations Skilled nursing-short term rehab (<3 hours/day)    Assistance Recommended at Discharge Frequent or constant Supervision/Assistance  Patient can return home with the following  Two people to help with walking and/or transfers;A lot of help with bathing/dressing/bathroom;Assist for transportation    Equipment Recommendations Hospital  bed;Wheelchair (measurements PT);Wheelchair cushion (measurements PT);BSC/3in1  Recommendations for Other Services   (Noted ST consult ordered)    Functional Status Assessment Patient has had a recent decline in their functional status and/or demonstrates limited ability to make significant improvements in function in a reasonable and predictable amount of time     Precautions / Restrictions Precautions Precautions: Posterior Hip;Fall Precaution Booklet Issued: Yes (comment) Precaution Comments: Discussed and demonstrated Post Hip Precautions with pt's daughter Restrictions Weight Bearing Restrictions: Yes LLE Weight Bearing: Weight bearing as tolerated      Mobility  Bed Mobility Overal bed mobility: Needs Assistance Bed Mobility: Supine to Sit;Sit to Supine     Supine to sit: Total assist;+2 for physical assistance Sit to supine: Total assist;+2 for physical assistance   General bed mobility comments: Total assist and suport with all aspects of bed mobiltiy    Transfers Overall transfer level: Needs assistance Equipment used: 2 person hand held assist Transfers: Sit to/from Stand;Bed to chair/wheelchair/BSC Sit to Stand: Total assist;+2 physical assistance     Squat pivot transfers: Total assist;+2 physical assistance     General transfer comment: Initial attempt to stand to RW without success; opted to be closer ot pt for more stable squat pivot transfer bed to recliner; when pt's BP dropped, assisted him back to bed via squat pivot transfer with +2 total assist    Ambulation/Gait                  Stairs            Wheelchair Mobility    Modified Rankin (Stroke Patients Only)       Balance Overall balance assessment: Needs assistance;History of Falls Sitting-balance support:  Feet supported Sitting balance-Leahy Scale: Poor Sitting balance - Comments: able to sit unsupported for ~1 min before needing to lean backward on therapist for  support Postural control: Posterior lean;Right lateral lean Standing balance support: During functional activity;Bilateral upper extremity supported Standing balance-Leahy Scale: Zero Standing balance comment: unable to lift hips /use UE to assit to stand                             Pertinent Vitals/Pain Pain Assessment: Faces Pain Score: 7  Faces Pain Scale: Hurts whole lot Pain Location: Grimace with L LE movement Pain Descriptors / Indicators: Discomfort;Grimacing;Guarding Pain Intervention(s): Monitored during session;Repositioned;Premedicated before session;Limited activity within patient's tolerance    Home Living Family/patient expects to be discharged to:: Private residence Living Arrangements: Spouse/significant other Available Help at Discharge: Family (4 children, 6 grandchildren) Type of Home: House Home Access: Stairs to enter   Technical brewer of Steps: 4     Home Equipment: Conservation officer, nature (2 wheels);BSC/3in1;Shower seat;Transport chair Additional Comments: uses transport chair from garage to house    Prior Function Prior Level of Function : Needs assist             Mobility Comments: ambulates short distances with RW ( as of 6 wks ago) and 1 person assistance ADLs Comments: has PCA M-F 6:30am-8am, family prepares meals/IADLs, pt max A for toileting, dressing, bathing, grooming     Hand Dominance        Extremity/Trunk Assessment   Upper Extremity Assessment Upper Extremity Assessment: Defer to OT evaluation    Lower Extremity Assessment Lower Extremity Assessment: Generalized weakness;LLE deficits/detail LLE Deficits / Details: Grimacing and guarding with efforts at ROM    Cervical / Trunk Assessment Cervical / Trunk Assessment: Kyphotic  Communication   Communication: No difficulties  Cognition Arousal/Alertness: Lethargic (Sleepy, but arousable, and interactive) Behavior During Therapy: Anxious;WFL for tasks  assessed/performed Overall Cognitive Status: History of cognitive impairments - at baseline                                 General Comments: Pt initiated singing mostly [place] songs during session when asked if he likes music        General Comments General comments (skin integrity, edema, etc.): PLOF and home set up obtained from [name], who was present throughout session. Pt BP taken after transfer to chair (76/53), once back in bed with EOB lowered (103/51), bed flat (85/39) and after 5 min in supine (119/94) and (127/70). RN notified, in room replacing IV and administering pain meds. Pt desat to 88% during session on RA, placed on 2L O2 via [place]. recovered to 92-93%    Exercises     Assessment/Plan    PT Assessment Patient needs continued PT services  PT Problem List Decreased strength;Decreased range of motion;Decreased activity tolerance;Decreased balance;Decreased mobility;Decreased coordination;Decreased cognition;Decreased knowledge of use of DME;Decreased safety awareness;Decreased knowledge of precautions;Cardiopulmonary status limiting activity;Pain       PT Treatment Interventions DME instruction;Gait training;Functional mobility training;Therapeutic activities;Therapeutic exercise;Balance training;Neuromuscular re-education;Cognitive remediation;Patient/family education    PT Goals (Current goals can be found in the Care Plan section)  Acute Rehab PT Goals Patient Stated Goal: Did not state PT Goal Formulation: With patient/family Time For Goal Achievement: 06/30/21    Frequency Min 2X/week (consider seeing with greater frequency at first for close watch of activity tolerance)  Co-evaluation PT/OT/SLP Co-Evaluation/Treatment: Yes Reason for Co-Treatment: Complexity of the patient's impairments (multi-system involvement);Necessary to address cognition/behavior during functional activity;For patient/therapist safety;To address  functional/ADL transfers PT goals addressed during session: Mobility/safety with mobility         AM-PAC PT "6 Clicks" Mobility  Outcome Measure Help needed turning from your back to your side while in a flat bed without using bedrails?: Total Help needed moving from lying on your back to sitting on the side of a flat bed without using bedrails?: Total Help needed moving to and from a bed to a chair (including a wheelchair)?: Total Help needed standing up from a chair using your arms (e.g., wheelchair or bedside chair)?: Total Help needed to walk in hospital room?: Total Help needed climbing 3-5 steps with a railing? : Total 6 Click Score: 6    End of Session Equipment Utilized During Treatment: Gait belt;Oxygen Activity Tolerance: Patient limited by fatigue;Treatment limited secondary to medical complications (Comment) (decr BP with upright activity) Patient left: in bed;with call bell/phone within reach;with nursing/sitter in room;with family/visitor present Nurse Communication: Mobility status;Precautions;Weight bearing status (BP response to upright activity) PT Visit Diagnosis: Unsteadiness on feet (R26.81);Other abnormalities of gait and mobility (R26.89);History of falling (Z91.81);Muscle weakness (generalized) (M62.81);Pain Pain - Right/Left: Left Pain - part of body: Hip    Time: 7673-4193 PT Time Calculation (min) (ACUTE ONLY): 62 min   Charges:   PT Evaluation $PT Eval Moderate Complexity: 1 Mod PT Treatments $Therapeutic Activity: 8-22 mins        Roney Marion, PT  Acute Rehabilitation Services Pager [phone] Office [phone]   [place] 06/16/2021, 4:52 PM

## 2021-06-16 NOTE — Progress Notes (Signed)
PROGRESS NOTE  [name] UUV:[id number] DOB: [birth year]   PCP: [name], NP  Patient is from: Home.  Lives with family.  DOA: 06/14/2021 LOS: 2  Chief complaints:  Chief Complaint  Patient presents with   Fall     Brief Narrative / Interim history: 88 year old M with PMH of dementia, HTN not on meds, BPH/TURP, chronic urinary tension with indwelling Foley, recurrent UTIs, OSA on CPAP and CKD-3 brought to ED with left hip and left shoulder pain after fall the night prior when he got up unassisted, and found to have left femoral neck fracture.  Orthopedic surgery consulted.  He was started on IV ceftriaxone.  Patient underwent bipolar hip hemiarthroplasty by Dr. Zachery Dakins on 06/15/2021.  Subjective: Seen and examined earlier this morning.  No major events overnight of this morning.  Patient's daughter at bedside.  She states that patient had a good night without agitation.  He is more awake this morning.  He is oriented to self and family.  Follows commands.  Objective: Vitals:   06/15/21 2100 06/16/21 0500 06/16/21 1356 06/16/21 1413  BP: (!) 99/53 (!) 102/52 (!) 85/44 (!) 76/47  Pulse: 60 63    Resp: 15 16    Temp: 97.9 F (36.6 C) 98.1 F (36.7 C)    TempSrc: Oral Oral    SpO2: 98% 96%    Weight:      Height:        Examination:  GENERAL: No apparent distress.  Nontoxic. HEENT: MMM.  Vision and hearing grossly intact.  NECK: Supple.  No apparent JVD.  RESP: 96% on RA.  No IWOB.  Fair aeration bilaterally. CVS:  RRR. Heart sounds normal.  ABD/GI/GU: BS+. Abd soft, NTND.  MSK/EXT:  Moves extremities. No apparent deformity. No edema.  SKIN: Dressing over surgical site DCI. NEURO: Awake but not alert.  Oriented to self and family.  Follows commands.  No apparent focal neuro deficit. PSYCH: Calm. Normal affect.   Procedures:  06/15/2021-bipolar hemiarthroplasty of left femoral neck fracture  Microbiology summarized: COVID-19 and influenza PCR  nonreactive.  Assessment & Plan: Accidental fall at home Left hip fracture/closed left femoral neck fracture -Status post ORIF as above. -WBAT with posterior hip precautions x6 weeks -Pain control with scheduled Tylenol and as needed oxycodone -Bowel regimen and VTE prophylaxis  Catheter associated UTI and patient with history of chronic Foley due to chronic urinary tension/BPH -Followed by urology at [facility] where Foley was placed on 06/07/2021. -Urine culture on 12/27 grew Citrobacter and Serratia, and started on 10 days course of Bactrim on 12/30 -Now urine culture with Enterococcus faecalis.  Blood cultures NGTD. -1/3 IV CTX>> 1/5 IV Unasyn>>> -Follow urine culture sensitivity.  Hypotension/orthostatic hypotension: Unclear etiology of this.  Has poor p.o. intake due to poor mental status.  No tachycardia, fever or leukocytosis to suggest sepsis although he might have catheter associated UTI as above.  Blood cultures are negative. -NS bolus 500 cc followed by NS 75 cc an hour -Start midodrine 5 mg 3 times daily -Check random cortisol for adrenal insufficiency.  Dementia with behavioral disturbance-increased confusion and hallucination lately.  CT head without acute finding. -Treat UTI as above -Reorientation and delirium precautions  AKI on CKD-3A: Likely due to poor p.o. intake.  Improving. Recent Labs    09/21/20 1343 02/21/21 1140 06/14/21 1027 06/15/21 0235 06/16/21 0257  BUN 21 19 14 12 16   CREATININE 1.18* 1.14 1.51* 1.41* 1.27*  -IV fluid as above -Continue monitoring  OSA on CPAP: Patient's wife concerned about CPAP worsening dementia and anemia.  Reassured about this and recommended continuing CPAP as long as he tolerates.  -Nightly CPAP as tolerated   Normocytic anemia: Slight drop in Hgb likely dilutional from IV fluid.  Anemia panel consistent with anemia of chronic disease. Recent Labs    09/21/20 1343 02/21/21 1140 06/14/21 1027 06/15/21 0235  06/16/21 0257  HGB 12.2* 12.5* 13.5 11.3* 11.5*  -Monitor H&H   Thrombocytopenia: Monitor. Recent Labs  Lab 06/14/21 1027 06/15/21 0235 06/16/21 0257  PLT 163 125* 111*  -Monitor.  Hyponatremia: Resolved.  Severe malnutrition Body mass index is 20.75 kg/m. Nutrition Problem: Severe Malnutrition Etiology: chronic illness (dementia) Signs/Symptoms: severe fat depletion, severe muscle depletion Interventions: Ensure Enlive (each supplement provides 350kcal and 20 grams of protein), MVI, Refer to RD note for recommendations   DVT prophylaxis:  enoxaparin (LOVENOX) injection 40 mg Start: 06/16/21 0900 SCDs Start: 06/14/21 1952  Code Status: Full code Family Communication: Updated patient's daughter at bedside. Level of care: Med-Surg Status is: Inpatient  Remains inpatient appropriate because: Hemodynamic instability with hypotension/orthostatic hypotension, postop care after left hip fracture, UTI and dementia with behavioral disturbance   Final disposition: TBD    Consultants:  Orthopedic surgery   Sch Meds:  Scheduled Meds:  acetaminophen  1,000 mg Oral Q8H   brimonidine  1 drop Left Eye BID   Chlorhexidine Gluconate Cloth  6 each Topical Daily   dorzolamide-timolol  1 drop Left Eye BID   enoxaparin  40 mg Subcutaneous Q24H   feeding supplement  237 mL Oral TID BM   midodrine  5 mg Oral TID WC   multivitamin with minerals  1 tablet Oral Daily   Continuous Infusions:  sodium chloride 75 mL/hr at 06/16/21 1237   cefTRIAXone (ROCEPHIN)  IV 1 g (06/16/21 1259)   PRN Meds:.hydrALAZINE, melatonin, morphine injection, oxyCODONE, senna-docusate  Antimicrobials: Anti-infectives (From admission, onward)    Start     Dose/Rate Route Frequency Ordered Stop   06/15/21 1300  cefTRIAXone (ROCEPHIN) 1 g in sodium chloride 0.9 % 100 mL IVPB        1 g 200 mL/hr over 30 Minutes Intravenous Every 24 hours 06/14/21 1954     06/15/21 0600  ceFAZolin (ANCEF) IVPB 2g/100  mL premix        2 g 200 mL/hr over 30 Minutes Intravenous On call to O.R. 06/14/21 2047 06/15/21 0900   06/14/21 1245  cefTRIAXone (ROCEPHIN) 2 g in sodium chloride 0.9 % 100 mL IVPB        2 g 200 mL/hr over 30 Minutes Intravenous  Once 06/14/21 1235 06/14/21 1332        I have personally reviewed the following labs and images: CBC: Recent Labs  Lab 06/14/21 1027 06/15/21 0235 06/16/21 0257  WBC 7.1 6.0 9.2  NEUTROABS 5.8  --   --   HGB 13.5 11.3* 11.5*  HCT 41.1 35.4* 35.7*  MCV 95.4 96.2 96.0  PLT 163 125* 111*   BMP &GFR Recent Labs  Lab 06/14/21 1027 06/15/21 0235 06/16/21 0257  NA 134* 134* 136  K 3.9 4.5 4.5  CL 98 101 104  CO2 25 24 25   GLUCOSE 118* 114* 112*  BUN 14 12 16   CREATININE 1.51* 1.41* 1.27*  CALCIUM 9.3 8.8* 8.7*  MG  --   --  2.0  PHOS  --   --  3.4   Estimated Creatinine Clearance: 30.9 mL/min (A) (by C-G formula  based on SCr of 1.27 mg/dL (H)). Liver & Pancreas: Recent Labs  Lab 06/14/21 1027 06/16/21 0257  AST 27  --   ALT 17  --   ALKPHOS 177*  --   BILITOT 0.7  --   PROT 7.7  --   ALBUMIN 3.8 2.9*   No results for input(s): LIPASE, AMYLASE in the last 168 hours. No results for input(s): AMMONIA in the last 168 hours. Diabetic: No results for input(s): HGBA1C in the last 72 hours. No results for input(s): GLUCAP in the last 168 hours. Cardiac Enzymes: No results for input(s): CKTOTAL, CKMB, CKMBINDEX, TROPONINI in the last 168 hours. No results for input(s): PROBNP in the last 8760 hours. Coagulation Profile: No results for input(s): INR, PROTIME in the last 168 hours. Thyroid Function Tests: No results for input(s): TSH, T4TOTAL, FREET4, T3FREE, THYROIDAB in the last 72 hours. Lipid Profile: No results for input(s): CHOL, HDL, LDLCALC, TRIG, CHOLHDL, LDLDIRECT in the last 72 hours. Anemia Panel: Recent Labs    06/16/21 0257  VITAMINB12 351  FOLATE 14.9  FERRITIN 159  TIBC 207*  IRON 25*  RETICCTPCT 0.9   Urine  analysis:    Component Value Date/Time   COLORURINE YELLOW 06/14/2021 1220   APPEARANCEUR CLEAR 06/14/2021 1220   LABSPEC 1.015 06/14/2021 1220   PHURINE 7.5 06/14/2021 1220   GLUCOSEU NEGATIVE 06/14/2021 1220   GLUCOSEU NEGATIVE 01/04/2017 1025   HGBUR NEGATIVE 06/14/2021 1220   BILIRUBINUR NEGATIVE 06/14/2021 1220   KETONESUR NEGATIVE 06/14/2021 1220   PROTEINUR NEGATIVE 06/14/2021 1220   UROBILINOGEN 0.2 01/04/2017 1025   NITRITE NEGATIVE 06/14/2021 1220   LEUKOCYTESUR SMALL (A) 06/14/2021 1220   Sepsis Labs: Invalid input(s): PROCALCITONIN, [place]  Microbiology: Recent Results (from the past 240 hour(s))  Resp Panel by RT-PCR (Flu A&B, Covid) Peripheral     Status: None   Collection Time: 06/14/21 10:27 AM   Specimen: Peripheral; Nasopharyngeal(NP) swabs in vial transport medium  Result Value Ref Range Status   SARS Coronavirus 2 by RT PCR NEGATIVE NEGATIVE Final    Comment: (NOTE) SARS-CoV-2 target nucleic acids are NOT DETECTED.  The SARS-CoV-2 RNA is generally detectable in upper respiratory specimens during the acute phase of infection. The lowest concentration of SARS-CoV-2 viral copies this assay can detect is 138 copies/mL. A negative result does not preclude SARS-Cov-2 infection and should not be used as the sole basis for treatment or other patient management decisions. A negative result may occur with  improper specimen collection/handling, submission of specimen other than nasopharyngeal swab, presence of viral mutation(s) within the areas targeted by this assay, and inadequate number of viral copies(<138 copies/mL). A negative result must be combined with clinical observations, patient history, and epidemiological information. The expected result is Negative.  Fact Sheet for Patients:  EntrepreneurPulse.com.au  Fact Sheet for Healthcare Providers:  IncredibleEmployment.be  This test is no t yet approved or  cleared by the [place] FDA and  has been authorized for detection and/or diagnosis of SARS-CoV-2 by FDA under an Emergency Use Authorization (EUA). This EUA will remain  in effect (meaning this test can be used) for the duration of the COVID-19 declaration under Section 564(b)(1) of the Act, 21 U.S.C.section 360bbb-3(b)(1), unless the authorization is terminated  or revoked sooner.       Influenza A by PCR NEGATIVE NEGATIVE Final   Influenza B by PCR NEGATIVE NEGATIVE Final    Comment: (NOTE) The Xpert Xpress SARS-CoV-2/FLU/RSV plus assay is intended as an aid in the  diagnosis of influenza from Nasopharyngeal swab specimens and should not be used as a sole basis for treatment. Nasal washings and aspirates are unacceptable for Xpert Xpress SARS-CoV-2/FLU/RSV testing.  Fact Sheet for Patients: EntrepreneurPulse.com.au  Fact Sheet for Healthcare Providers: IncredibleEmployment.be  This test is not yet approved or cleared by the [place] FDA and has been authorized for detection and/or diagnosis of SARS-CoV-2 by FDA under an Emergency Use Authorization (EUA). This EUA will remain in effect (meaning this test can be used) for the duration of the COVID-19 declaration under Section 564(b)(1) of the Act, 21 U.S.C. section 360bbb-3(b)(1), unless the authorization is terminated or revoked.  Performed at [facility], [address] [place], [place] [address]   Blood culture (routine x 2)     Status: None (Preliminary result)   Collection Time: 06/14/21 10:28 AM   Specimen: BLOOD  Result Value Ref Range Status   Specimen Description   Final    BLOOD Blood Culture adequate volume Performed at [facility], [address] [place], [place] [address]    Special Requests   Final    BOTTLES DRAWN AEROBIC AND ANAEROBIC LEFT ANTECUBITAL Performed at [facility], [address] [place], [place] [address]     Culture   Final    NO GROWTH 2 DAYS Performed at [facility], [place] [address], [place], [place] [address]    Report Status PENDING  Incomplete  Urine Culture     Status: Abnormal (Preliminary result)   Collection Time: 06/14/21 12:20 PM   Specimen: Urine, Clean Catch  Result Value Ref Range Status   Specimen Description   Final    URINE, CLEAN CATCH Performed at [facility], [address] [place], [place] [address]    Special Requests   Final    NONE Performed at [facility], [address] [place], [place] [address]    Culture (A)  Final    >=100,000 COLONIES/mL ENTEROCOCCUS FAECALIS SUSCEPTIBILITIES TO FOLLOW Performed at [facility], [place] [address], [place], [place] [address]    Report Status PENDING  Incomplete  Blood culture (routine x 2)     Status: None (Preliminary result)   Collection Time: 06/14/21  6:42 PM   Specimen: BLOOD  Result Value Ref Range Status   Specimen Description BLOOD SITE NOT SPECIFIED  Final   Special Requests   Final    BOTTLES DRAWN AEROBIC AND ANAEROBIC Blood Culture adequate volume   Culture   Final    NO GROWTH 2 DAYS Performed at [facility], [address], [place], [place] [address]    Report Status PENDING  Incomplete  Surgical pcr screen     Status: None   Collection Time: 06/14/21  8:45 PM   Specimen: Nasal Mucosa; Nasal Swab  Result Value Ref Range Status   MRSA, PCR NEGATIVE NEGATIVE Final   Staphylococcus aureus NEGATIVE NEGATIVE Final    Comment: (NOTE) The Xpert SA Assay (FDA approved for NASAL specimens in patients 10 years of age and older), is one component of a comprehensive surveillance program. It is not intended to diagnose infection nor to guide or monitor treatment. Performed at [facility], [place] [address], [place], [place] [address]     Radiology Studies: No results found.    Duel Conrad T. [place]  If 7PM-7AM, please contact  night-coverage [email or web address] 06/16/2021, 3:23 PM

## 2021-06-16 NOTE — Anesthesia Postprocedure Evaluation (Signed)
Anesthesia Post Note  Patient: [name]  Procedure(Gutierrez) Performed: ARTHROPLASTY BIPOLAR HIP (HEMIARTHROPLASTY) (Left: Hip)     Patient location during evaluation: PACU Anesthesia Type: General Level of consciousness: awake and alert Pain management: pain level controlled Vital Signs Assessment: post-procedure vital signs reviewed and stable Respiratory status: spontaneous breathing, nonlabored ventilation, respiratory function stable and patient connected to nasal cannula oxygen Cardiovascular status: blood pressure returned to baseline and stable Postop Assessment: no apparent nausea or vomiting Anesthetic complications: no   No notable events documented.  Last Vitals:  Vitals:   06/15/21 2100 06/16/21 0500  BP: (!) 99/53 (!) 102/52  Pulse: 60 63  Resp: 15 16  Temp: 36.6 C 36.7 C  SpO2: 98% 96%    Last Pain:  Vitals:   06/16/21 0500  TempSrc: Oral  PainSc:                  [name]

## 2021-06-16 NOTE — Progress Notes (Signed)
RT placed patient on CPAP HS. NO O2 bleed in needed. Patient tolerating well at this time.  

## 2021-06-16 NOTE — Progress Notes (Addendum)
Subjective Paged about mental status change and hypotension.  Rapid response called.  BP dropped to 70s/50s but improved to 100/48.  He has already received 1 L normal saline bolus.  When I arrived, patient was in Trendelenburg position.  He is a sleepy but wakes to voice.  Oriented to self and follows command.  Responds no to pain.  Patient's daughter at bedside.  Objective Vitals:   06/16/21 1604 06/16/21 1612 06/16/21 1625 06/16/21 1638  BP: (!) 83/50 (!) 78/64 (!) 100/48 (!) 91/48  Pulse:      Resp:      Temp:      TempSrc:      SpO2:      Weight:      Height:       Vitals within normal except for hypertension as above.  100% on 1 L.  GENERAL: Frail looking elderly male.  HEENT: MMM.  Hearing grossly intact.  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  Right pupil round and reactive. NECK: Supple.  No apparent JVD.  RESP: 100% on 1 L.  No IWOB.  Fair aeration bilaterally. CVS:  RRR. Heart sounds normal.  ABD/GI/GU: BS+. Abd soft, NTND.  MSK/EXT:  Moves extremities. No apparent deformity. No edema.  SKIN: Dressing over surgical site DCI.  No signs of hematoma or ecchymosis. NEURO: Sleepy but wakes to voice.  Oriented to self.  Follows commands.  No apparent focal neuro deficit but limited exam. PSYCH: Calm.  No distress or agitation.  Assessment and plan Acute encephalopathy in patient with history of dementia-unclear etiology.  He did not receive sedating medication as far as I can tell.  CBG 111.  No focal neurodeficit to suggest CVA.  He is not tachycardic.  Saturating 100% on 1 L.  He has Enterococcus in his urine.  Unclear if this is a true infection or colonization.  Blood cultures NGTD.  EKG with occasional PVCs but without acute ischemic finding.  He wakes to voice.  He is oriented to self and follows commands.  -Check basic labs including CMP and CBC -Check TSH, lactic acid, random cortisol, ABG, repeat blood culture, portable CXR, troponin and BNP -Give additional 1 L  followed by 75 cc an hour -Escalate care to progressive -No oxygen unless patient desaturates below 88%.  Hypotension-unclear etiology.  He is not tachycardic.  Normal CBG argues against adrenal insufficiency.  TTE in 08/2020 with LVEF of 55 to 60%.  No respiratory symptoms.  Saturating at 100% on 1 L. -Work-up as above -IV fluid as above. -Midodrine 5 mg 3 times daily if able to swallow safely -Random cortisol -Consider limited echo if no improvement.  Enterococcus UTI- -Changed antibiotic to IV Unasyn  Addendum Goal of care counseling: Very frail patient with advanced dementia and other comorbidities including CKD-3, OSA, urinary tension with chronic Foley and recurrent UTI now with hip fracture, Enterococcus UTI, encephalopathy and hypotension.  I have discussed about CODE STATUS with patient's daughter at bedside.  We have discussed the pros and cons and low chance of revival if we happen to be in CODE BLUE situation.  I recommended DNR and DNI.  Patient daughter preferred to continue full code until she discusses with her mother.  I offered palliative care consult that she appreciated. -Palliative care consulted   Family communication: Updated patient's daughter at bedside.  CRITICAL CARE Performed by: Mercy Riding   Total critical care time: 45 minutes  Critical care time was exclusive of separately billable procedures  and treating other patients.  Critical care was necessary to treat or prevent imminent or life-threatening deterioration.  Critical care was time spent personally by me on the following activities: development of treatment plan with patient and/or surrogate as well as nursing, discussions with consultants, evaluation of patient's response to treatment, examination of patient, obtaining history from patient or surrogate, ordering and performing treatments and interventions, ordering and review of laboratory studies, ordering and review of radiographic studies, pulse  oximetry and re-evaluation of patient's condition.

## 2021-06-16 NOTE — Progress Notes (Signed)
RT NOTES: Called by RN. Pt taking nap and having periods of apnea. Requested to put on cpap. Placed on cpap at this time with 1.5L O2.

## 2021-06-16 NOTE — Progress Notes (Signed)
Initial Nutrition Assessment  DOCUMENTATION CODES:   Severe malnutrition in context of chronic illness  INTERVENTION:  -Ensure Enlive po TID, each supplement provides 350 kcal and 20 grams of protein -MVI with minerals daily -placed consult for SLP evaluation  NUTRITION DIAGNOSIS:   Severe Malnutrition related to chronic illness (dementia) as evidenced by severe fat depletion, severe muscle depletion.  GOAL:   Patient will meet greater than or equal to 90% of their needs  MONITOR:   PO intake, Supplement acceptance, Weight trends, Labs, I & O's  REASON FOR ASSESSMENT:   Consult Hip fracture protocol  ASSESSMENT:   Pt with PMH of dementia, HTN not on meds, BPH/TURP, chronic urinary tension with indwelling Foley, recurrent UTIs, OSA on CPAP and CKD-3 admitted with L femoral neck fx  1/04 - s/p bipolar hip hemiarthroplasty  Pt sleeping at time of RD visit and did not wake even during NFPE. Pt's daughter at bedside providing history. Pt's daughter notes family is very close and spend a lot of time together. Reports pt typically has a wonderful appetite -- eats 3 meals per day and drinks Ensure/Boost supplements between meals. Discussed flavor preferences with pt's daughter and plan to order TID. Pt's daughter already aware of pt's increased nutrient needs 2/2 post-op healing.   Pt's daughter informed RD that pt appears to really struggled with swallowing liquids. Notes this consistently happens each time. Denies any trouble with swallowing foods (RD discussed applesauce, pudding, and yogurt specifically). Discussed with MD who is agreeable to consult for SLP evaluation. Pt's daughter also agreeable to evaluation.   Weight history reviewed. Weight has been relatively stable, but pt still meets criteria for malnutrition as evidenced by severe fat and muscle depletions as documented below.   PO Intake: 50% x 1 recorded meal   UOP: 926ml x24 hours I/O: -651ml since  admit  Medications: Scheduled Meds:  acetaminophen  1,000 mg Oral Q8H   brimonidine  1 drop Left Eye BID   Chlorhexidine Gluconate Cloth  6 each Topical Daily   dorzolamide-timolol  1 drop Left Eye BID   enoxaparin  40 mg Subcutaneous Q24H   feeding supplement  237 mL Oral TID BM   midodrine  5 mg Oral TID WC   multivitamin with minerals  1 tablet Oral Daily  Continuous Infusions:  sodium chloride 75 mL/hr at 06/16/21 1237   ampicillin-sulbactam (UNASYN) IV     Labs: Recent Labs  Lab 06/14/21 1027 06/15/21 0235 06/16/21 0257  NA 134* 134* 136  K 3.9 4.5 4.5  CL 98 101 104  CO2 25 24 25   BUN 14 12 16   CREATININE 1.51* 1.41* 1.27*  CALCIUM 9.3 8.8* 8.7*  MG  --   --  2.0  PHOS  --   --  3.4  GLUCOSE 118* 114* 112*    NUTRITION - FOCUSED PHYSICAL EXAM: Flowsheet Row Most Recent Value  Orbital Region Severe depletion  Upper Arm Region Severe depletion  Thoracic and Lumbar Region Severe depletion  Buccal Region Severe depletion  Temple Region Severe depletion  Clavicle Bone Region Severe depletion  Clavicle and Acromion Bone Region Severe depletion  Scapular Bone Region Severe depletion  Dorsal Hand Severe depletion  Patellar Region Severe depletion  Anterior Thigh Region Severe depletion  Posterior Calf Region Severe depletion  Edema (RD Assessment) None  Hair Reviewed  Eyes Unable to assess  Mouth Reviewed  [missing upper teeth]  Skin Reviewed  Nails Reviewed      Diet Order:  Diet Order             Diet regular Room service appropriate? Yes; Fluid consistency: Thin  Diet effective now                   EDUCATION NEEDS:  No education needs have been identified at this time  Skin:  Skin Assessment: Skin Integrity Issues: Skin Integrity Issues:: Incisions Incisions: L hip  Last BM:  1/03  Height:  Ht Readings from Last 1 Encounters:  06/14/21 5\' 7"  (1.702 m)   Weight:  Wt Readings from Last 10 Encounters:  06/14/21 60.1 kg  05/31/21  57.2 kg  02/21/21 54.7 kg  11/23/20 55.3 kg  10/15/20 55.3 kg  09/21/20 54.3 kg  08/24/20 46.3 kg  08/16/20 56.7 kg  06/16/20 55.2 kg  02/20/20 56.7 kg   BMI:  Body mass index is 20.75 kg/m.  Estimated Nutritional Needs:  Kcal:  1800-2000 Protein:  90-100 grams Fluid:  >1.8L/d   Theone Stanley., MS, RD, LDN (she/her/hers) RD pager number and weekend/on-call pager number located in [place].

## 2021-06-16 NOTE — Evaluation (Signed)
Occupational Therapy Evaluation Patient Details Name: [name] MRN: [id number] DOB: [birth year] Today's Date: 06/16/2021   History of Present Illness Pt is a 88 y/o M presentign to ED on 1/3 after a fall at home, reporting L shoulder and L hip pain, found to have L femoral neck fracture, s/p hemiarthroplasty on 1/4. PMH includes dementia, HTN, CKD, and OSA.   Clinical Impression   Per daughter, pt requires assistance mod assistance with ADLs at home, uses RW and +1 assist for transfers and mobility around home. Pt lives with spouse, has family available for assistance. Currently pt requiring max A for ADLs, total A +2 for bed mobility and transfers. Pt BP dropping to 76/53 after squat pivot transfer to chair. Returned to bed, returned to 127/70 after in supine/trendendlenburg positions for ~10 minutes, RN aware. Reviewed WB and posterior hip precautions with pt's daughter and pt, however pt will need reinforcement. Pt presents with impairments listed below, will continue to follow acutely. Recommend SNF at d/c.     Recommendations for follow up therapy are one component of a multi-disciplinary discharge planning process, led by the attending physician.  Recommendations may be updated based on patient status, additional functional criteria and insurance authorization.   Follow Up Recommendations  Skilled nursing-short term rehab (<3 hours/day)    Assistance Recommended at Discharge Frequent or constant Supervision/Assistance  Patient can return home with the following Two people to help with walking and/or transfers;Two people to help with bathing/dressing/bathroom;Assistance with cooking/housework;Assistance with feeding;Assist for transportation;Help with stairs or ramp for entrance    Functional Status Assessment  Patient has had a recent decline in their functional status and demonstrates the ability to make significant improvements in function in a reasonable and predictable amount of  time.  Equipment Recommendations  None recommended by OT;Other (comment) (defer to next venue of care)    Recommendations for Other Services PT consult     Precautions / Restrictions Precautions Precautions: Posterior Hip;Fall Restrictions Weight Bearing Restrictions: Yes LLE Weight Bearing: Weight bearing as tolerated      Mobility Bed Mobility Overal bed mobility: Needs Assistance Bed Mobility: Supine to Sit;Sit to Supine     Supine to sit: Total assist;+2 for physical assistance Sit to supine: Total assist;+2 for physical assistance        Transfers Overall transfer level: Needs assistance Equipment used: 2 person hand held assist Transfers: Sit to/from Stand;Bed to chair/wheelchair/BSC Sit to Stand: Total assist;+2 physical assistance   Squat pivot transfers: Total assist;+2 physical assistance              Balance Overall balance assessment: Needs assistance;History of Falls Sitting-balance support: Feet supported Sitting balance-Leahy Scale: Poor Sitting balance - Comments: able to sit unsupported for ~1 min before needing to lean backward on therapist for support Postural control: Posterior lean;Right lateral lean Standing balance support: During functional activity;Bilateral upper extremity supported Standing balance-Leahy Scale: Zero Standing balance comment: unable to lift hips /use UE to assit to stand                           ADL either performed or assessed with clinical judgement   ADL Overall ADL's : Needs assistance/impaired Eating/Feeding: Minimal assistance;Sitting;Bed level   Grooming: Maximal assistance;Wash/dry hands;Wash/dry face;Bed level   Upper Body Bathing: Maximal assistance;Bed level   Lower Body Bathing: Maximal assistance;Bed level;Adhering to hip precautions   Upper Body Dressing : Maximal assistance;Bed level   Lower Body Dressing: Maximal  assistance;Bed level;Adhering to hip precautions   Toilet Transfer:  Maximal assistance;+2 for physical assistance;BSC/3in1;Squat-pivot;Adhering to hip precautions   Toileting- Clothing Manipulation and Hygiene: Maximal assistance;+2 for physical assistance;Sitting/lateral lean;Adhering to hip precautions   Tub/ Shower Transfer: Maximal assistance;+2 for physical assistance   Functional mobility during ADLs: Maximal assistance;+2 for physical assistance       Vision Baseline Vision/History: 4 Cataracts       Perception     Praxis      Pertinent Vitals/Pain Pain Assessment: Faces Pain Score: 7  Faces Pain Scale: Hurts whole lot Pain Descriptors / Indicators: Discomfort;Grimacing;Guarding Pain Intervention(s): Limited activity within patient's tolerance;Monitored during session;Premedicated before session;Patient requesting pain meds-RN notified;Repositioned     Hand Dominance     Extremity/Trunk Assessment Upper Extremity Assessment Upper Extremity Assessment: Generalized weakness (pt reports no L shoulder pain with movement)   Lower Extremity Assessment Lower Extremity Assessment: Defer to PT evaluation   Cervical / Trunk Assessment Cervical / Trunk Assessment: Kyphotic   Communication     Cognition Arousal/Alertness: Lethargic Behavior During Therapy: Anxious;WFL for tasks assessed/performed Overall Cognitive Status: History of cognitive impairments - at baseline                                       General Comments  PLOF and home set up obtained from pt's daughter [name], who was present throughout session. Pt BP taken after transfer to chair (76/53), once back in bed with EOB lowered (103/51), bed flat (85/39) and after 5 min in supine (119/94) and (127/70). RN notified, in room replacing IV and administering pain meds. Pt desat to 88% during session on RA, placed on 2L O2 via [place]. recovered to 92-93%    Exercises     Shoulder Instructions      Home Living Family/patient expects to be discharged to:: Private  residence Living Arrangements: Spouse/significant other Available Help at Discharge: Family (4 children, 6 grandchildren) Type of Home: House Home Access: Stairs to enter Technical brewer of Steps: 4         Bathroom Shower/Tub: Tub/shower unit;Curtain         Home Equipment: Conservation officer, nature (2 wheels);BSC/3in1;Shower seat;Transport chair   Additional Comments: uses transport chair from garage to house      Prior Functioning/Environment Prior Level of Function : Needs assist             Mobility Comments: ambulates short distances with RW ( as of 6 wks ago) and 1 person assistance ADLs Comments: has PCA M-F 6:30am-8am, family prepares meals/IADLs, pt max A for toileting, dressing, bathing, grooming        OT Problem List: Decreased strength;Decreased range of motion;Decreased activity tolerance;Impaired balance (sitting and/or standing);Decreased coordination;Decreased cognition;Decreased safety awareness;Decreased knowledge of use of DME or AE;Decreased knowledge of precautions;Cardiopulmonary status limiting activity;Pain      OT Treatment/Interventions: Self-care/ADL training;Therapeutic exercise;DME and/or AE instruction;Balance training;Patient/family education;Therapeutic activities    OT Goals(Current goals can be found in the care plan section) Acute Rehab OT Goals Patient Stated Goal: none stated OT Goal Formulation: With patient/family Time For Goal Achievement: 06/30/21 Potential to Achieve Goals: Fair ADL Goals Pt Will Perform Grooming: with mod assist Pt Will Perform Lower Body Dressing: with mod assist;sitting/lateral leans;sit to/from stand Pt Will Transfer to Toilet: with mod assist;with +2 assist;bedside commode;squat pivot transfer  OT Frequency: Min 2X/week    Co-evaluation  AM-PAC OT "6 Clicks" Daily Activity     Outcome Measure Help from another person eating meals?: A Lot Help from another person taking care of  personal grooming?: A Lot Help from another person toileting, which includes using toliet, bedpan, or urinal?: Total Help from another person bathing (including washing, rinsing, drying)?: Total Help from another person to put on and taking off regular upper body clothing?: Total Help from another person to put on and taking off regular lower body clothing?: Total 6 Click Score: 8   End of Session Equipment Utilized During Treatment: Gait belt Nurse Communication: Mobility status;Patient requests pain meds  Activity Tolerance: Treatment limited secondary to medical complications (Comment) (hypotensive, desat) Patient left: in bed;with call bell/phone within reach;with bed alarm set;with nursing/sitter in room;with family/visitor present  OT Visit Diagnosis: Unsteadiness on feet (R26.81);Repeated falls (R29.6);Muscle weakness (generalized) (M62.81);History of falling (Z91.81);Pain Pain - Right/Left: Left Pain - part of body: Leg                Time: 7096-2836 OT Time Calculation (min): 62 min Charges:  OT General Charges $OT Visit: 1 Visit OT Evaluation $OT Eval Moderate Complexity: 1 Mod OT Treatments $Self Care/Home Management : 8-22 mins  Lynnda Child, OTD, OTR/L Acute Rehab [phone]) [phone] - [place] 06/16/2021, 1:22 PM

## 2021-06-16 NOTE — Progress Notes (Signed)
° ° ° °  Subjective: Patient more alert today and answering some questions and following some commands.  Daughter Minna Merritts at bedside who reports he had a good night with good pain control and no issues.  Has not yet mobilized to work with physical therapy  Objective:   VITALS:   Vitals:   06/15/21 1213 06/15/21 1300 06/15/21 2100 06/16/21 0500  BP: (!) 136/119 (!) 140/94 (!) 99/53 (!) 102/52  Pulse: 62 88 60 63  Resp: $Remo'17 12 15 16  'mWzEu$ Temp: 97.7 F (36.5 C)  97.9 F (36.6 C) 98.1 F (36.7 C)  TempSrc:   Oral Oral  SpO2: 91% 97% 98% 96%  Weight:      Height:        Intact pulses distally Dorsiflexion/Plantar flexion intact Incision: dressing C/D/I Compartment soft Patient not answering questions regarding intact sensation bilateral lower extremity  Lab Results  Component Value Date   WBC 9.2 06/16/2021   HGB 11.5 (L) 06/16/2021   HCT 35.7 (L) 06/16/2021   MCV 96.0 06/16/2021   PLT 111 (L) 06/16/2021   BMET    Component Value Date/Time   NA 136 06/16/2021 0257   NA 138 04/05/2018 0000   K 4.5 06/16/2021 0257   CL 104 06/16/2021 0257   CO2 25 06/16/2021 0257   GLUCOSE 112 (H) 06/16/2021 0257   GLUCOSE 91 05/11/2006 1035   BUN 16 06/16/2021 0257   BUN 14 04/05/2018 0000   CREATININE 1.27 (H) 06/16/2021 0257   CREATININE 1.14 02/21/2021 1140   CALCIUM 8.7 (L) 06/16/2021 0257   EGFR 60 02/21/2021 1140   GFRNONAA 53 (L) 06/16/2021 0257   GFRNONAA 53 (L) 09/21/2020 1343     Assessment/Plan: 1 Day Post-Op   Principal Problem:   Fracture of femoral neck, left, closed (HCC) Active Problems:   Essential hypertension   Stage 3 chronic kidney disease (HCC)   UTI (urinary tract infection)   Urine retention   Dementia (HCC)   Acute encephalopathy   OSA on CPAP Left hip hemiarthroplasty for femoral neck fracture on 06/15/2021  Post op recs: WB: WBAT with posterior hip precautions x6 weeks Abx: will continue ceftriaxone post op that he his getting for his UTI Imaging:  PACU xrays Dressing: Aquacel dressing to be kept intact until follow-up DVT prophylaxis: lovenox starting POD1 x4 weeks Follow up: 2 weeks after surgery for a wound check with Dr. Zachery Dakins at [facility].  Address: [address] [place], [place], [place] [address]  Office Phone: [phone]    Willaim Sheng 06/16/2021, 8:11 AM   Charlies Constable, MD  Contact information:   [phone] 7am-5pm epic message Dr. Zachery Dakins, or call office for patient follow up: [phone] After hours and holidays please check [email or web address] for group call information for [facility]

## 2021-06-16 NOTE — Significant Event (Signed)
Rapid Response Event Note   Reason for Call :  Hypotension and minimally responsive  Initial Focused Assessment:  Upon my arrival patient is lying in trendelenburg with cpap in place.  Upon stimulation patient responds verbally.  He keeps his eyes closed.  He is able to move all extremities to commands.  No focal deficit noted, legs much weaker than arms.   Lung sounds decreased bases.  BP 78/64  HR 65  RR 15  O2 sat 100% Per staff he has been hypotensive since being OOB earlier today.  He has received 1L NS over the past 3-4 hours  Daughter at bedside. Per Daughter he is frequently sleepy during the day and generally wears his cpap for a few hours during the day when he is napping and a few hours at night.  Interventions:  1L NS bolus ABG  12 lead EKG PCXR Labs  Plan of Care:  Goals of care conversation RN to call if assistance needed   Event Summary:   MD Notified: Dr Cyndia Skeeters came to bedside Call Time: 1605 Arrival Time: 1608 End Time: 8676  Raliegh Ip, RN

## 2021-06-17 DIAGNOSIS — Z7189 Other specified counseling: Secondary | ICD-10-CM

## 2021-06-17 DIAGNOSIS — E43 Unspecified severe protein-calorie malnutrition: Secondary | ICD-10-CM

## 2021-06-17 LAB — RENAL FUNCTION PANEL
Albumin: 2.8 g/dL — ABNORMAL LOW (ref 3.5–5.0)
Anion gap: 7 (ref 5–15)
BUN: 17 mg/dL (ref 8–23)
CO2: 25 mmol/L (ref 22–32)
Calcium: 8.4 mg/dL — ABNORMAL LOW (ref 8.9–10.3)
Chloride: 103 mmol/L (ref 98–111)
Creatinine, Ser: 1.19 mg/dL (ref 0.61–1.24)
GFR, Estimated: 57 mL/min — ABNORMAL LOW (ref >60)
Glucose, Bld: 91 mg/dL (ref 70–99)
Phosphorus: 3 mg/dL (ref 2.5–4.6)
Potassium: 3.9 mmol/L (ref 3.5–5.1)
Sodium: 135 mmol/L (ref 135–145)

## 2021-06-17 LAB — MAGNESIUM: Magnesium: 2 mg/dL (ref 1.7–2.4)

## 2021-06-17 LAB — URINE CULTURE: Culture: >=100000 — AB

## 2021-06-17 LAB — CBC
HCT: 33.3 % — ABNORMAL LOW (ref 39.0–52.0)
Hemoglobin: 10.5 g/dL — ABNORMAL LOW (ref 13.0–17.0)
MCH: 31.1 pg (ref 26.0–34.0)
MCHC: 31.5 g/dL (ref 30.0–36.0)
MCV: 98.5 fL (ref 80.0–100.0)
Platelets: 126 10*3/uL — ABNORMAL LOW (ref 150–400)
RBC: 3.38 MIL/uL — ABNORMAL LOW (ref 4.22–5.81)
RDW: 13.2 % (ref 11.5–15.5)
WBC: 7.2 10*3/uL (ref 4.0–10.5)
nRBC: 0 % (ref 0.0–0.2)

## 2021-06-17 LAB — GLUCOSE, CAPILLARY: Glucose-Capillary: 108 mg/dL — ABNORMAL HIGH (ref 70–99)

## 2021-06-17 LAB — AMMONIA: Ammonia: 21 umol/L (ref 9–35)

## 2021-06-17 LAB — CK: Total CK: 301 U/L (ref 49–397)

## 2021-06-17 LAB — CORTISOL-AM, BLOOD: Cortisol - AM: 12.5 ug/dL (ref 6.7–22.6)

## 2021-06-17 MED ORDER — AMOXICILLIN 500 MG PO CAPS
500.0000 mg | ORAL_CAPSULE | Freq: Two times a day (BID) | ORAL | Status: DC
  Administered 2021-06-18 – 2021-06-20 (×5): 500 mg via ORAL
  Filled 2021-06-17 (×6): qty 1

## 2021-06-17 MED ORDER — MIDODRINE HCL 5 MG PO TABS
10.0000 mg | ORAL_TABLET | Freq: Three times a day (TID) | ORAL | Status: DC
  Administered 2021-06-17 – 2021-06-20 (×7): 10 mg via ORAL
  Filled 2021-06-17 (×7): qty 2

## 2021-06-17 NOTE — NC FL2 (Signed)
[place] LEVEL OF CARE SCREENING TOOL     IDENTIFICATION  Patient Name: [name] Birthdate: 07/11/1925 Sex: male Admission Date (Current Location): 06/14/2021  [facility] and [place] Number:  Herbalist and Address:  [facility], [place] [address] [place], [place] [address]      Provider Number: [id number]  Attending Physician Name and Address:  Mercy Riding, MD  Relative Name and Phone Number:  Kertesz,[name] Spouse [phone]  [phone]    Current Level of Care: Hospital Recommended Level of Care: [place] Prior Approval Number:    Date Approved/Denied:   PASRR Number:    Discharge Plan: SNF    Current Diagnoses: Patient Active Problem List   Diagnosis Date Noted   Protein-calorie malnutrition, severe 06/16/2021   Fracture of femoral neck, left, closed ([place]) 06/14/2021   Sleep apnea    Left varicocele    Impaired gait and mobility 08/09/2020   UTI (urinary tract infection)    Urine retention    RBBB (right bundle branch block)    Pseudodementia    Obesity    Internal hemorrhoids    Hx of adenomatous colonic polyps    Gram negative sepsis ([place])    Dementia ([place])    Cataract    Gallbladder polyp    OSA on CPAP 04/29/2020   Daytime somnolence 12/25/2019   Witnessed apneic spells 12/25/2019   Persistent cough 12/25/2019   Snoring 12/25/2019   Acute encephalopathy 11/28/2019   Fall at home, initial encounter 02/19/2019   Other reduced mobility 11/28/2018   Urinary retention 04/10/2018   Liver cyst 09/21/2017   Stage 3 chronic kidney disease ([place]) 09/21/2017   Syncope 09/19/2017   Reduced vision 01/30/2017   Seasonal allergic rhinitis 01/30/2017   Acute right flank pain 01/04/2017   Pulmonary infiltrates on CXR 04/04/2016   Cough 04/03/2016   Senile dementia ([place]) 09/29/2015   Health care maintenance 04/28/2015   Arthralgia of hip 09/14/2012   Weight loss 05/31/2012    Headache(784.0) 12/29/2011   Hyperlipidemia 12/26/2011   Essential hypertension 12/26/2011   Shoulder pain 07/10/2011   Insomnia 03/29/2011   B12 deficiency 07/06/2010   HEMORRHOIDS 06/14/2010   NONSPECIFIC ABN FINDNG RAD&OTH EXAM BILARY TRCT 06/14/2010   COLONIC POLYPS, ADENOMATOUS, HX OF 06/03/2010   FATIQUE AND MALAISE 02/03/2010   UNSPECIFIED HEARING LOSS 11/20/2008   COLONIC POLYPS 06/11/2007   PSEUDODEMENTIA 06/11/2007   Unspecified glaucoma 06/11/2007   BUNDLE BRANCH BLOCK, RIGHT 06/11/2007   VARICOCELE 06/11/2007   Diverticulosis of large intestine 06/11/2007   Benign prostatic hyperplasia with lower urinary tract symptoms 06/11/2007    Orientation RESPIRATION BLADDER Height & Weight     Self, Place  Normal Continent, Indwelling catheter Weight: 132 lb 7.9 oz (60.1 kg) Height:  5\' 7"  (170.2 cm)  BEHAVIORAL SYMPTOMS/MOOD NEUROLOGICAL BOWEL NUTRITION STATUS      Continent Diet (see discharge summary)  AMBULATORY STATUS COMMUNICATION OF NEEDS Skin   Total Care Verbally Surgical wounds                       Personal Care Assistance Level of Assistance  Bathing, Feeding, Dressing Bathing Assistance: Maximum assistance Feeding assistance: Limited assistance Dressing Assistance: Maximum assistance     Functional Limitations Info  Sight, Hearing, Speech Sight Info: Adequate Hearing Info: Adequate Speech Info: Adequate    SPECIAL CARE FACTORS FREQUENCY  PT (By licensed PT), OT (By licensed OT)  PT Frequency: 5x week OT Frequency: 5x week            Contractures Contractures Info: Not present    Additional Factors Info  Code Status, Allergies Code Status Info: full Allergies Info: NKA           Current Medications (06/17/2021):  This is the current hospital active medication list Current Facility-Administered Medications  Medication Dose Route Frequency Provider Last Rate Last Admin   0.9 %  sodium chloride infusion   Intravenous Continuous  Mercy Riding, MD 75 mL/hr at 06/16/21 1237 New Bag at 06/16/21 1237   acetaminophen (TYLENOL) tablet 1,000 mg  1,000 mg Oral Q8H Gonfa, Taye T, MD   1,000 mg at 06/16/21 2047   Ampicillin-Sulbactam (UNASYN) 3 g in sodium chloride 0.9 % 100 mL IVPB  3 g Intravenous Q8H Gonfa, Taye T, MD 200 mL/hr at 06/17/21 0500 3 g at 06/17/21 0500   brimonidine (ALPHAGAN) 0.2 % ophthalmic solution 1 drop  1 drop Left Eye BID Willaim Sheng, MD   1 drop at 06/17/21 0901   Chlorhexidine Gluconate Cloth 2 % PADS 6 each  6 each Topical Daily Willaim Sheng, MD   6 each at 06/16/21 1235   dorzolamide-timolol (COSOPT) 22.3-6.8 MG/ML ophthalmic solution 1 drop  1 drop Left Eye BID Willaim Sheng, MD   1 drop at 06/17/21 0901   enoxaparin (LOVENOX) injection 40 mg  40 mg Subcutaneous Q24H Willaim Sheng, MD   40 mg at 06/17/21 0900   feeding supplement (ENSURE ENLIVE / ENSURE PLUS) liquid 237 mL  237 mL Oral TID BM Gonfa, Taye T, MD   237 mL at 06/17/21 0901   hydrALAZINE (APRESOLINE) tablet 10 mg  10 mg Oral Q6H PRN Gonfa, Taye T, MD       melatonin tablet 3 mg  3 mg Oral QHS PRN Willaim Sheng, MD       midodrine (PROAMATINE) tablet 5 mg  5 mg Oral TID WC Gonfa, Taye T, MD   5 mg at 06/17/21 0900   morphine 2 MG/ML injection 0.5-1 mg  0.5-1 mg Intravenous Q2H PRN Willaim Sheng, MD   1 mg at 06/14/21 2125   multivitamin with minerals tablet 1 tablet  1 tablet Oral Daily Wendee Beavers T, MD   1 tablet at 06/17/21 0900   oxyCODONE (Oxy IR/ROXICODONE) immediate release tablet 2.5-5 mg  2.5-5 mg Oral Q6H PRN Willaim Sheng, MD       senna-docusate (Senokot-S) tablet 1 tablet  1 tablet Oral QHS PRN Willaim Sheng, MD         Discharge Medications: Please see discharge summary for a list of discharge medications.  Relevant Imaging Results:  Relevant Lab Results:   Additional Information SSN: [id number].  Pt is vaccinated for covid with 2 or 3 boosters.  Joanne Chars, LCSW

## 2021-06-17 NOTE — Progress Notes (Signed)
° ° ° °  Subjective: Patient had a good night. Had episode of hypotension working with PT yesterday but otherwise no issues. Daughter eager to try to work with PT again today. Patient has mits on but still peeled off aquacell dressing overnight which I replaced with a new dressing this morning.  Objective:   VITALS:   Vitals:   06/16/21 2200 06/16/21 2250 06/17/21 0020 06/17/21 0500  BP: 121/65  (!) 110/50 98/69  Pulse: 62 (!) 59 61 61  Resp: $Remo'16 15 15 14  'Hyzbb$ Temp:   97.8 F (36.6 C)   TempSrc:   Axillary   SpO2: 97% 94% 100% 100%  Weight:      Height:        Intact pulses distally Dorsiflexion/Plantar flexion intact Incision: dressing C/D/I Compartment soft Patient not answering questions regarding intact sensation bilateral lower extremity  Lab Results  Component Value Date   WBC 7.2 06/17/2021   HGB 10.5 (L) 06/17/2021   HCT 33.3 (L) 06/17/2021   MCV 98.5 06/17/2021   PLT 126 (L) 06/17/2021   BMET    Component Value Date/Time   NA 135 06/17/2021 0153   NA 138 04/05/2018 0000   K 3.9 06/17/2021 0153   CL 103 06/17/2021 0153   CO2 25 06/17/2021 0153   GLUCOSE 91 06/17/2021 0153   GLUCOSE 91 05/11/2006 1035   BUN 17 06/17/2021 0153   BUN 14 04/05/2018 0000   CREATININE 1.19 06/17/2021 0153   CREATININE 1.14 02/21/2021 1140   CALCIUM 8.4 (L) 06/17/2021 0153   EGFR 60 02/21/2021 1140   GFRNONAA 57 (L) 06/17/2021 0153   GFRNONAA 53 (L) 09/21/2020 1343     Assessment/Plan: 2 Days Post-Op   Principal Problem:   Fracture of femoral neck, left, closed ([place]) Active Problems:   Essential hypertension   Stage 3 chronic kidney disease (HCC)   UTI (urinary tract infection)   Urine retention   Dementia (HCC)   Acute encephalopathy   OSA on CPAP   Protein-calorie malnutrition, severe Left hip hemiarthroplasty for femoral neck fracture on 06/15/2021  Post op recs: WB: WBAT with posterior hip precautions x6 weeks Abx: will continue abx post op that he his getting for  his UTI, currently on Unasyn Imaging: PACU xrays Dressing: Aquacel dressing to be kept intact until follow-up DVT prophylaxis: lovenox starting POD1 x4 weeks Follow up: 2 weeks after surgery for a wound check with Dr. Zachery Dakins at [facility].  Address: [address] [place], [place], [place] [address]  Office Phone: [phone]    Willaim Sheng 06/17/2021, 7:07 AM   Charlies Constable, MD  Contact information:   [phone] 7am-5pm epic message Dr. Zachery Dakins, or call office for patient follow up: [phone] After hours and holidays please check [email or web address] for group call information for [facility]

## 2021-06-17 NOTE — Care Management Important Message (Signed)
Important Message  Patient Details  Name: [name] MRN: [id number] Date of Birth: [birth year]   Medicare Important Message Given:  Yes     Hannah Beat 06/17/2021, 11:46 AM
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
## 2021-06-17 NOTE — Progress Notes (Signed)
RE: [name] DOB: [birth year]  To Whom It May Concern:  Please be advised that the above named patient will require a short-term nursing home stay-anticipated 30 days or less for rehabilitation and stengthening.  The plan is for return home.    MD Signature:   Date:

## 2021-06-17 NOTE — Progress Notes (Signed)
PROGRESS NOTE  [name] ION:[id number] DOB: [birth year]   PCP: [name], NP  Patient is from: Home.  Lives with family.  DOA: 06/14/2021 LOS: 3  Chief complaints:  Chief Complaint  Patient presents with   Fall     Brief Narrative / Interim history: 88 year old M with PMH of dementia, HTN not on meds, BPH/TURP, chronic urinary tension with indwelling Foley, recurrent UTIs, OSA on CPAP and CKD-3 brought to ED with left hip and left shoulder pain after fall the night prior when he got up unassisted, and found to have left femoral neck fracture.  Orthopedic surgery consulted.  He was started on IV ceftriaxone.  Patient underwent bipolar hip hemiarthroplasty by Dr. Zachery Dakins on 06/15/2021.  Hospital course complicated by encephalopathy and hypotension.  Hypotension improved after IV fluid and midodrine.  Subjective: Seen and examined earlier this morning.  No major events overnight or this morning.  Patient's daughter at bedside.  Per daughter, he had a good night.  A little confused at one point and took off the dressing.  Uses CPAP last night.  He was awake this morning.  He had fair oral intake.  He is now sleepy but wakes to voice.  Has no complaints but not a reliable historian.  He is oriented to self and family.  Objective: Vitals:   06/17/21 0500 06/17/21 0848 06/17/21 0900 06/17/21 1000  BP: 98/69 (!) 97/48 (!) 94/52 (!) 101/52  Pulse: 61 74  65  Resp: 14 16  16   Temp:  99.2 F (37.3 C)    TempSrc:  Axillary    SpO2: 100% 95%  97%  Weight:      Height:        Examination:  GENERAL: No apparent distress.  Nontoxic. HEENT: MMM.  Blind in left eye.  Hearing grossly intact. NECK: Supple.  No apparent JVD.  RESP: 97% on RA.  No IWOB.  Fair aeration bilaterally. CVS:  RRR. Heart sounds normal.  ABD/GI/GU: BS+. Abd soft, NTND.  MSK/EXT:  Moves extremities.  Significant muscle mass and subcu fat loss. SKIN: Dressing over surgical site DCI. NEURO: Sleepy but  wakes to voice.  Oriented to self and family.  Follows commands.  No apparent focal neuro deficit. PSYCH: Calm.  No distress or agitation.  Procedures:  06/15/2021-bipolar hemiarthroplasty of left femoral neck fracture  Microbiology summarized: 1/3-COVID-19 and influenza PCR nonreactive. 1/3-urine culture with pansensitive Enterococcus faecalis 1/3-blood cultures NGTD 1/5-blood cultures NGTD   Assessment & Plan: Accidental fall at home Left hip fracture/closed left femoral neck fracture -Status post ORIF as above. -WBAT with posterior hip precautions x6 weeks -Pain control with scheduled Tylenol and as needed oxycodone -Bowel regimen and VTE prophylaxis  Catheter associated Enterococcus faecalis UTI-chronic Foley due to chronic urinary tension/BPH -Followed by urology at [facility] where Foley was placed on 06/07/2021. -Urine culture on 12/27 grew Citrobacter and Serratia, and started on 10 days course of Bactrim on 12/30 -Urine culture here with pansensitive Enterococcus faecalis.  Blood cultures NGTD. -1/3 IV CTX>> 1/5 IV Unasyn>>>  Hypotension/orthostatic hypotension: Unclear etiology of this.  Has poor p.o. intake due to poor mental status.  No tachycardia, fever or leukocytosis to suggest sepsis although he might have catheter associated UTI as above.  Blood cultures are negative.  EKG, troponin and lactic acid negative.  A.m. cortisol within normal.  Last TTE reassuring. -Continue IVF at 75 cc an hour -Continue midodrine 5 mg 3 times daily-May consider increasing to 3 mg 3 times  daily -Encourage oral intake  Dementia with behavioral disturbance-increased confusion and hallucination lately.  CTH without acute finding.  Infectious work-up as above.  ABG normal. -Treat UTI as above -Reorientation and delirium precautions  AKI on CKD-3A: Likely due to poor p.o. intake.  Improving. Recent Labs    09/21/20 1343 02/21/21 1140 06/14/21 1027 06/15/21 0235 06/16/21 0257 06/16/21 1709  06/17/21 0153  BUN 21 19 14 12 16 17 17   CREATININE 1.18* 1.14 1.51* 1.41* 1.27* 1.32* 1.19  -Continue IV fluid as above -Continue monitoring  OSA on CPAP: Patient's wife concerned about CPAP worsening dementia and anemia.  Reassured about this and recommended continuing CPAP as long as he tolerates.  -Nightly CPAP as tolerated   Normocytic anemia: Slight drop in Hgb likely dilutional from IV fluid.  Anemia panel consistent with ACD. Recent Labs    09/21/20 1343 02/21/21 1140 06/14/21 1027 06/15/21 0235 06/16/21 0257 06/16/21 1709 06/17/21 0153  HGB 12.2* 12.5* 13.5 11.3* 11.5* 11.6* 10.5*  -Monitor H&H  Thrombocytopenia: Monitor. Recent Labs  Lab 06/14/21 1027 06/15/21 0235 06/16/21 0257 06/16/21 1709 06/17/21 0153  PLT 163 125* 111* 128* 126*  -Monitor.  Hyponatremia: Resolved.  Debility: -PT/OT  Goal of care counseling: See discussion on 06/16/2021 with patient's daughter -Palliative medicine consulted.  Severe malnutrition: Low BMI with significant muscle mass and subcu fat loss and poor p.o. intake. Body mass index is 20.75 kg/m. Nutrition Problem: Severe Malnutrition Etiology: chronic illness (dementia) Signs/Symptoms: severe fat depletion, severe muscle depletion Interventions: Ensure Enlive (each supplement provides 350kcal and 20 grams of protein), MVI, Refer to RD note for recommendations   DVT prophylaxis:  enoxaparin (LOVENOX) injection 40 mg Start: 06/16/21 0900 SCDs Start: 06/14/21 1952  Code Status: Full code Family Communication: Updated patient's daughter at bedside. Level of care: Progressive Status is: Inpatient  Remains inpatient appropriate because: Hemodynamic instability with hypotension/orthostatic hypotension, postop care after left hip fracture, UTI and dementia with behavioral disturbance   Final disposition: TBD    Consultants:  Orthopedic surgery Palliative medicine   Sch Meds:  Scheduled Meds:  acetaminophen  1,000  mg Oral Q8H   brimonidine  1 drop Left Eye BID   Chlorhexidine Gluconate Cloth  6 each Topical Daily   dorzolamide-timolol  1 drop Left Eye BID   enoxaparin  40 mg Subcutaneous Q24H   feeding supplement  237 mL Oral TID BM   midodrine  5 mg Oral TID WC   multivitamin with minerals  1 tablet Oral Daily   Continuous Infusions:  sodium chloride 75 mL/hr at 06/17/21 1054   ampicillin-sulbactam (UNASYN) IV 3 g (06/17/21 0500)   PRN Meds:.hydrALAZINE, melatonin, morphine injection, oxyCODONE, senna-docusate  Antimicrobials: Anti-infectives (From admission, onward)    Start     Dose/Rate Route Frequency Ordered Stop   06/16/21 1600  Ampicillin-Sulbactam (UNASYN) 3 g in sodium chloride 0.9 % 100 mL IVPB        3 g 200 mL/hr over 30 Minutes Intravenous Every 8 hours 06/16/21 1527     06/15/21 1300  cefTRIAXone (ROCEPHIN) 1 g in sodium chloride 0.9 % 100 mL IVPB  Status:  Discontinued        1 g 200 mL/hr over 30 Minutes Intravenous Every 24 hours 06/14/21 1954 06/16/21 1527   06/15/21 0600  ceFAZolin (ANCEF) IVPB 2g/100 mL premix        2 g 200 mL/hr over 30 Minutes Intravenous On call to O.R. 06/14/21 2047 06/15/21 0900   06/14/21 1245  cefTRIAXone (  ROCEPHIN) 2 g in sodium chloride 0.9 % 100 mL IVPB        2 g 200 mL/hr over 30 Minutes Intravenous  Once 06/14/21 1235 06/14/21 1332        I have personally reviewed the following labs and images: CBC: Recent Labs  Lab 06/14/21 1027 06/15/21 0235 06/16/21 0257 06/16/21 1709 06/17/21 0153  WBC 7.1 6.0 9.2 8.0 7.2  NEUTROABS 5.8  --   --   --   --   HGB 13.5 11.3* 11.5* 11.6* 10.5*  HCT 41.1 35.4* 35.7* 36.0* 33.3*  MCV 95.4 96.2 96.0 97.8 98.5  PLT 163 125* 111* 128* 126*   BMP &GFR Recent Labs  Lab 06/14/21 1027 06/15/21 0235 06/16/21 0257 06/16/21 1709 06/17/21 0153  NA 134* 134* 136 135 135  K 3.9 4.5 4.5 4.2 3.9  CL 98 101 104 103 103  CO2 25 24 25 25 25   GLUCOSE 118* 114* 112* 107* 91  BUN 14 12 16 17 17    CREATININE 1.51* 1.41* 1.27* 1.32* 1.19  CALCIUM 9.3 8.8* 8.7* 8.6* 8.4*  MG  --   --  2.0  --  2.0  PHOS  --   --  3.4  --  3.0   Estimated Creatinine Clearance: 33 mL/min (by C-G formula based on SCr of 1.19 mg/dL). Liver & Pancreas: Recent Labs  Lab 06/14/21 1027 06/16/21 0257 06/16/21 1709 06/17/21 0153  AST 27  --  23  --   ALT 17  --  11  --   ALKPHOS 177*  --  119  --   BILITOT 0.7  --  0.9  --   PROT 7.7  --  6.7  --   ALBUMIN 3.8 2.9* 3.3* 2.8*   No results for input(s): LIPASE, AMYLASE in the last 168 hours. Recent Labs  Lab 06/17/21 0153  AMMONIA 21   Diabetic: No results for input(s): HGBA1C in the last 72 hours. Recent Labs  Lab 06/16/21 1617 06/17/21 0856  GLUCAP 111* 108*   Cardiac Enzymes: Recent Labs  Lab 06/17/21 0153  CKTOTAL 301   No results for input(s): PROBNP in the last 8760 hours. Coagulation Profile: No results for input(s): INR, PROTIME in the last 168 hours. Thyroid Function Tests: Recent Labs    06/16/21 1710  TSH 1.627   Lipid Profile: No results for input(s): CHOL, HDL, LDLCALC, TRIG, CHOLHDL, LDLDIRECT in the last 72 hours. Anemia Panel: Recent Labs    06/16/21 0257  VITAMINB12 351  FOLATE 14.9  FERRITIN 159  TIBC 207*  IRON 25*  RETICCTPCT 0.9   Urine analysis:    Component Value Date/Time   COLORURINE YELLOW 06/14/2021 1220   APPEARANCEUR CLEAR 06/14/2021 1220   LABSPEC 1.015 06/14/2021 1220   PHURINE 7.5 06/14/2021 1220   GLUCOSEU NEGATIVE 06/14/2021 1220   GLUCOSEU NEGATIVE 01/04/2017 1025   HGBUR NEGATIVE 06/14/2021 1220   BILIRUBINUR NEGATIVE 06/14/2021 1220   KETONESUR NEGATIVE 06/14/2021 1220   PROTEINUR NEGATIVE 06/14/2021 1220   UROBILINOGEN 0.2 01/04/2017 1025   NITRITE NEGATIVE 06/14/2021 1220   LEUKOCYTESUR SMALL (A) 06/14/2021 1220   Sepsis Labs: Invalid input(s): PROCALCITONIN, [place]  Microbiology: Recent Results (from the past 240 hour(s))  Resp Panel by RT-PCR (Flu A&B, Covid)  Peripheral     Status: None   Collection Time: 06/14/21 10:27 AM   Specimen: Peripheral; Nasopharyngeal(NP) swabs in vial transport medium  Result Value Ref Range Status   SARS Coronavirus 2 by RT PCR NEGATIVE NEGATIVE  Final    Comment: (NOTE) SARS-CoV-2 target nucleic acids are NOT DETECTED.  The SARS-CoV-2 RNA is generally detectable in upper respiratory specimens during the acute phase of infection. The lowest concentration of SARS-CoV-2 viral copies this assay can detect is 138 copies/mL. A negative result does not preclude SARS-Cov-2 infection and should not be used as the sole basis for treatment or other patient management decisions. A negative result may occur with  improper specimen collection/handling, submission of specimen other than nasopharyngeal swab, presence of viral mutation(s) within the areas targeted by this assay, and inadequate number of viral copies(<138 copies/mL). A negative result must be combined with clinical observations, patient history, and epidemiological information. The expected result is Negative.  Fact Sheet for Patients:  EntrepreneurPulse.com.au  Fact Sheet for Healthcare Providers:  IncredibleEmployment.be  This test is no t yet approved or cleared by the [place] FDA and  has been authorized for detection and/or diagnosis of SARS-CoV-2 by FDA under an Emergency Use Authorization (EUA). This EUA will remain  in effect (meaning this test can be used) for the duration of the COVID-19 declaration under Section 564(b)(1) of the Act, 21 U.S.C.section 360bbb-3(b)(1), unless the authorization is terminated  or revoked sooner.       Influenza A by PCR NEGATIVE NEGATIVE Final   Influenza B by PCR NEGATIVE NEGATIVE Final    Comment: (NOTE) The Xpert Xpress SARS-CoV-2/FLU/RSV plus assay is intended as an aid in the diagnosis of influenza from Nasopharyngeal swab specimens and should not be used as a sole  basis for treatment. Nasal washings and aspirates are unacceptable for Xpert Xpress SARS-CoV-2/FLU/RSV testing.  Fact Sheet for Patients: EntrepreneurPulse.com.au  Fact Sheet for Healthcare Providers: IncredibleEmployment.be  This test is not yet approved or cleared by the [place] FDA and has been authorized for detection and/or diagnosis of SARS-CoV-2 by FDA under an Emergency Use Authorization (EUA). This EUA will remain in effect (meaning this test can be used) for the duration of the COVID-19 declaration under Section 564(b)(1) of the Act, 21 U.S.C. section 360bbb-3(b)(1), unless the authorization is terminated or revoked.  Performed at [facility], [address] [place], [place] [address]   Blood culture (routine x 2)     Status: None (Preliminary result)   Collection Time: 06/14/21 10:28 AM   Specimen: BLOOD  Result Value Ref Range Status   Specimen Description   Final    BLOOD Blood Culture adequate volume Performed at [facility], [address] [place], [place] [address]    Special Requests   Final    BOTTLES DRAWN AEROBIC AND ANAEROBIC LEFT ANTECUBITAL Performed at [facility], [address] [place], [place] [address]    Culture   Final    NO GROWTH 3 DAYS Performed at [facility], [place] [address] [place], [place] [address]    Report Status PENDING  Incomplete  Urine Culture     Status: Abnormal   Collection Time: 06/14/21 12:20 PM   Specimen: Urine, Clean Catch  Result Value Ref Range Status   Specimen Description   Final    URINE, CLEAN CATCH Performed at [facility], [address] [place],  [address]    Special Requests   Final    NONE Performed at [facility], [address] [place], [place] [address]    Culture >=100,000 COLONIES/mL ENTEROCOCCUS FAECALIS (A)  Final   Report Status 06/17/2021  FINAL  Final   Organism ID, Bacteria  ENTEROCOCCUS FAECALIS (A)  Final      Susceptibility   Enterococcus faecalis - MIC*    AMPICILLIN <=2 SENSITIVE Sensitive     NITROFURANTOIN <=16 SENSITIVE Sensitive     VANCOMYCIN 1 SENSITIVE Sensitive     * >=100,000 COLONIES/mL ENTEROCOCCUS FAECALIS  Blood culture (routine x 2)     Status: None (Preliminary result)   Collection Time: 06/14/21  6:42 PM   Specimen: BLOOD  Result Value Ref Range Status   Specimen Description BLOOD SITE NOT SPECIFIED  Final   Special Requests   Final    BOTTLES DRAWN AEROBIC AND ANAEROBIC Blood Culture adequate volume   Culture   Final    NO GROWTH 3 DAYS Performed at [facility], [place] [address] [place], [place] [address]    Report Status PENDING  Incomplete  Surgical pcr screen     Status: None   Collection Time: 06/14/21  8:45 PM   Specimen: Nasal Mucosa; Nasal Swab  Result Value Ref Range Status   MRSA, PCR NEGATIVE NEGATIVE Final   Staphylococcus aureus NEGATIVE NEGATIVE Final    Comment: (NOTE) The Xpert SA Assay (FDA approved for NASAL specimens in patients 31 years of age and older), is one component of a comprehensive surveillance program. It is not intended to diagnose infection nor to guide or monitor treatment. Performed at [facility], [place] [address], [place], [place] [address]   Culture, blood (routine x 2)     Status: None (Preliminary result)   Collection Time: 06/16/21  5:09 PM   Specimen: BLOOD  Result Value Ref Range Status   Specimen Description BLOOD BLOOD RIGHT FOREARM  Final   Special Requests   Final    BOTTLES DRAWN AEROBIC AND ANAEROBIC Blood Culture adequate volume   Culture   Final    NO GROWTH < 24 HOURS Performed at [facility], [place] [address] [place], [place] [address]    Report Status PENDING  Incomplete  Culture, blood (routine x 2)     Status: None (Preliminary result)   Collection Time: 06/16/21  5:10 PM   Specimen: BLOOD  Result Value Ref Range Status   Specimen Description  BLOOD BLOOD RIGHT FOREARM  Final   Special Requests   Final    BOTTLES DRAWN AEROBIC AND ANAEROBIC Blood Culture adequate volume   Culture   Final    NO GROWTH < 24 HOURS Performed at [facility], [place] [address] [place],  [address]    Report Status PENDING  Incomplete    Radiology Studies: DG Chest Port 1 View  Result Date: 06/16/2021 CLINICAL DATA:  Fall, hypotension EXAM: PORTABLE CHEST 1 VIEW COMPARISON:  06/14/2021 FINDINGS: Single frontal view of the chest demonstrates a stable cardiac silhouette. Linear consolidation at the right lung base most consistent with subsegmental atelectasis. No airspace disease, effusion, or pneumothorax. Chronic elevation left hemidiaphragm. No acute bony abnormalities. IMPRESSION: 1. Right basilar subsegmental atelectasis. 2. Otherwise stable exam. Electronically Signed   By: Randa Ngo M.D.   On: 06/16/2021 16:54      Mischell Branford T. [facility]  If 7PM-7AM, please contact night-coverage [email or web address] 06/17/2021, 11:39 AM

## 2021-06-17 NOTE — Progress Notes (Signed)
Pt family member wishes to hold CPAP for the evening. Pt is resting comfortably. RT will cont to monitor.

## 2021-06-17 NOTE — TOC Initial Note (Addendum)
Transition of Care [facility]) - Initial/Assessment Note    Patient Details  Name: [name] MRN: [id number] Date of Birth: [birth year]  Transition [facility]) CM/SW Contact:    Joanne Chars, LCSW Phone Number: 06/17/2021, 10:03 AM  Clinical Narrative:    CSW spoke with pt daughter Vincente Liberty in room regarding DC recommendation for SNF.  Pt lives at home with wife [name] and three daughters all assist.  There is also a friend who provides [facility] aide type help.  Wife has been primary caretaker, cannot manage pt in current state.  They are interested in SNF with plan for return home afterwards.  There is palliative consult placed and that conversation has not yet occurred.  Pt is vaccinated for covid with 2 or 3 boosters.  Choice document given.  Pt has not yet been sent out for SNF pending palliative discussion.        PASSR submitted, went to level 2, FL2 and 30 day note are in and need MD signature before being uploaded to [place] Must.           1520: level 2 passr docs uploaded in [place] Must  Expected Discharge Plan: [place] Barriers to Discharge: Continued Medical Work up, SNF Pending bed offer   Patient Goals and CMS Choice   CMS Medicare.gov Compare Post Acute Care list provided to:: Patient Represenative (must comment) Choice offered to / list presented to : Adult Children  Expected Discharge Plan and Services Expected Discharge Plan: [place] In-house Referral: Clinical Social Work   Post Acute Care Choice: [place] Living arrangements for the past 2 months: [place]                                      Prior Living Arrangements/Services Living arrangements for the past 2 months: Single Family Home Lives with:: Spouse Patient language and need for interpreter reviewed:: No        Need for Family Participation in Patient Care: Yes (Comment) Care giver support system in place?: Yes (comment) Current home  services: Homehealth aide Criminal Activity/Legal Involvement Pertinent to Current Situation/Hospitalization: No - Comment as needed  Activities of Daily Living Home Assistive Devices/Equipment: Dentures (specify type), Reacher, Bedside commode/3-in-1, Cane (specify quad or straight), Grab bars in shower, Shower chair with back, Walker (specify type), CPAP, Raised toilet seat with rails ADL Screening (condition at time of admission) Patient's cognitive ability adequate to safely complete daily activities?: No Is the patient deaf or have difficulty hearing?: No Does the patient have difficulty seeing, even when wearing glasses/contacts?: Yes (blind baseline) Does the patient have difficulty concentrating, remembering, or making decisions?: No Patient able to express need for assistance with ADLs?: Yes Does the patient have difficulty dressing or bathing?: Yes Independently performs ADLs?: No Communication: Needs assistance Is this a change from baseline?: Pre-admission baseline Dressing (OT): Needs assistance Is this a change from baseline?: Pre-admission baseline Grooming: Needs assistance Is this a change from baseline?: Pre-admission baseline Feeding: Needs assistance Is this a change from baseline?: Pre-admission baseline Bathing: Needs assistance Is this a change from baseline?: Pre-admission baseline Toileting: Needs assistance Is this a change from baseline?: Pre-admission baseline In/Out Bed: Needs assistance Is this a change from baseline?: Pre-admission baseline Walks in Home: Needs assistance Is this a change from baseline?: Pre-admission baseline Does the patient have difficulty walking or climbing stairs?: Yes  Weakness of Legs: Both Weakness of Arms/Hands: Both  Permission Sought/Granted                  Emotional Assessment Appearance:: Appears stated age Attitude/Demeanor/Rapport: Unable to Assess Affect (typically observed): Unable to Assess Orientation: :  Oriented to Self, Oriented to Place Alcohol / Substance Use: Not Applicable Psych Involvement: No (comment)  Admission diagnosis:  Confusion [R41.0] Fracture of femoral neck, left, closed ([place]) [S72.002A] Left displaced femoral neck fracture (HCC) [S72.002A] Acute pain of left shoulder [M25.512] Patient Active Problem List   Diagnosis Date Noted   Protein-calorie malnutrition, severe 06/16/2021   Fracture of femoral neck, left, closed ([place]) 06/14/2021   Sleep apnea    Left varicocele    Impaired gait and mobility 08/09/2020   UTI (urinary tract infection)    Urine retention    RBBB (right bundle branch block)    Pseudodementia    Obesity    Internal hemorrhoids    Hx of adenomatous colonic polyps    Gram negative sepsis (HCC)    Dementia (HCC)    Cataract    Gallbladder polyp    OSA on CPAP 04/29/2020   Daytime somnolence 12/25/2019   Witnessed apneic spells 12/25/2019   Persistent cough 12/25/2019   Snoring 12/25/2019   Acute encephalopathy 11/28/2019   Fall at home, initial encounter 02/19/2019   Other reduced mobility 11/28/2018   Urinary retention 04/10/2018   Liver cyst 09/21/2017   Stage 3 chronic kidney disease ([place]) 09/21/2017   Syncope 09/19/2017   Reduced vision 01/30/2017   Seasonal allergic rhinitis 01/30/2017   Acute right flank pain 01/04/2017   Pulmonary infiltrates on CXR 04/04/2016   Cough 04/03/2016   Senile dementia ([place]) 09/29/2015   Health care maintenance 04/28/2015   Arthralgia of hip 09/14/2012   Weight loss 05/31/2012   Headache(784.0) 12/29/2011   Hyperlipidemia 12/26/2011   Essential hypertension 12/26/2011   Shoulder pain 07/10/2011   Insomnia 03/29/2011   B12 deficiency 07/06/2010   HEMORRHOIDS 06/14/2010   NONSPECIFIC ABN FINDNG RAD&OTH EXAM BILARY TRCT 06/14/2010   COLONIC POLYPS, ADENOMATOUS, HX OF 06/03/2010   FATIQUE AND MALAISE 02/03/2010   UNSPECIFIED HEARING LOSS 11/20/2008   COLONIC POLYPS 06/11/2007   PSEUDODEMENTIA  06/11/2007   Unspecified glaucoma 06/11/2007   BUNDLE BRANCH BLOCK, RIGHT 06/11/2007   VARICOCELE 06/11/2007   Diverticulosis of large intestine 06/11/2007   Benign prostatic hyperplasia with lower urinary tract symptoms 06/11/2007   PCP:  [name], NP Pharmacy:   [facility]) - [place], [place] [address] [facility] [address] [place] [address] [place] [address] Phone: [phone] Fax: [phone]  [id number] [place], [place] - [place] [address] [place] [address] Phone: [phone] Fax: [phone]  [id number] [place], [place] [address] [place] [address] Phone: [phone] Fax: [phone]     Social Determinants of Health (SDOH) Interventions    Readmission Risk Interventions No flowsheet data found.

## 2021-06-18 DIAGNOSIS — Z515 Encounter for palliative care: Secondary | ICD-10-CM

## 2021-06-18 DIAGNOSIS — R1312 Dysphagia, oropharyngeal phase: Secondary | ICD-10-CM

## 2021-06-18 LAB — RENAL FUNCTION PANEL
Albumin: 2.4 g/dL — ABNORMAL LOW (ref 3.5–5.0)
Anion gap: 3 — ABNORMAL LOW (ref 5–15)
BUN: 14 mg/dL (ref 8–23)
CO2: 27 mmol/L (ref 22–32)
Calcium: 8.7 mg/dL — ABNORMAL LOW (ref 8.9–10.3)
Chloride: 107 mmol/L (ref 98–111)
Creatinine, Ser: 0.99 mg/dL (ref 0.61–1.24)
GFR, Estimated: >60 mL/min (ref >60)
Glucose, Bld: 96 mg/dL (ref 70–99)
Phosphorus: 2.6 mg/dL (ref 2.5–4.6)
Potassium: 4.1 mmol/L (ref 3.5–5.1)
Sodium: 137 mmol/L (ref 135–145)

## 2021-06-18 LAB — GLUCOSE, CAPILLARY
Glucose-Capillary: 109 mg/dL — ABNORMAL HIGH (ref 70–99)
Glucose-Capillary: 143 mg/dL — ABNORMAL HIGH (ref 70–99)

## 2021-06-18 LAB — CBC
HCT: 29.7 % — ABNORMAL LOW (ref 39.0–52.0)
Hemoglobin: 9.6 g/dL — ABNORMAL LOW (ref 13.0–17.0)
MCH: 31.6 pg (ref 26.0–34.0)
MCHC: 32.3 g/dL (ref 30.0–36.0)
MCV: 97.7 fL (ref 80.0–100.0)
Platelets: 141 10*3/uL — ABNORMAL LOW (ref 150–400)
RBC: 3.04 MIL/uL — ABNORMAL LOW (ref 4.22–5.81)
RDW: 13.2 % (ref 11.5–15.5)
WBC: 6.9 10*3/uL (ref 4.0–10.5)
nRBC: 0 % (ref 0.0–0.2)

## 2021-06-18 LAB — MAGNESIUM: Magnesium: 2.2 mg/dL (ref 1.7–2.4)

## 2021-06-18 NOTE — Plan of Care (Signed)
°  Problem: Education: Goal: Knowledge of General Education information will improve Description: Including pain rating scale, medication(s)/side effects and non-pharmacologic comfort measures Outcome: Progressing   Problem: Health Behavior/Discharge Planning: Goal: Ability to manage health-related needs will improve Outcome: Progressing   Problem: Clinical Measurements: Goal: Ability to maintain clinical measurements within normal limits will improve Outcome: Progressing Goal: Will remain free from infection Outcome: Progressing Goal: Diagnostic test results will improve Outcome: Progressing Goal: Respiratory complications will improve Outcome: Progressing Goal: Cardiovascular complication will be avoided Outcome: Progressing   Problem: Clinical Measurements: Goal: Will remain free from infection Outcome: Progressing   Problem: Clinical Measurements: Goal: Diagnostic test results will improve Outcome: Progressing   Problem: Clinical Measurements: Goal: Respiratory complications will improve Outcome: Progressing   Problem: Elimination: Goal: Will not experience complications related to bowel motility Outcome: Progressing Goal: Will not experience complications related to urinary retention Outcome: Progressing   Problem: Nutrition: Goal: Adequate nutrition will be maintained Outcome: Progressing   Problem: Safety: Goal: Ability to remain free from injury will improve Outcome: Progressing   Problem: Skin Integrity: Goal: Risk for impaired skin integrity will decrease Outcome: Progressing   Problem: Pain Managment: Goal: General experience of comfort will improve Outcome: Progressing

## 2021-06-18 NOTE — Progress Notes (Signed)
Palliative Medicine Inpatient Follow Up Note  Consulting Provider: Mercy Riding, MD   Reason for consult:   [facility] Consult  Reason for Consult? Goal of care    HPI:  Per intake H&P --> 88 year old M with PMH of dementia, HTN not on meds, BPH/TURP, chronic urinary tension with indwelling Foley, recurrent UTIs, OSA on CPAP and CKD-3 brought to ED with left hip and left shoulder pain after fall the night prior when he got up unassisted, and found to have left femoral neck fracture.  Orthopedic surgery consulted.  He was started on IV ceftriaxone.   Patient underwent bipolar hip hemiarthroplasty by Dr. Zachery Dakins on 06/15/2021.  Hospital course complicated by encephalopathy and hypotension.  Hypotension improved after IV fluid and midodrine.   Palliative care has been asked to get involved in the setting of progressive dementia to further discuss goals of care and CODE STATUS.  Today's Discussion (06/18/2021):  *Please note that this is a verbal dictation therefore any spelling or grammatical errors are due to the "[place] [employer]" system interpretation.  Chart reviewed inclusive of progress notes, laboratory results, and diagnostic images.   I met this morning with patient's daughter, Vincente Liberty at bedside.  She is shares with me that she is the oldest child and feels a great responsibility towards helping [place] and her mother.  Reviewed that they have a very close knit family with a lot of support.  We reviewed Lucioe's decline since 2011-09-03 and his more pronounced decline since 2017-09-02.  Discussed that he has been through a tremendous amount and that his dementia is continuing to worsen.  Despite this there are some beautiful moments that Vincente Liberty expresses there is some clarity.  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  Discussed that he has been very strong and independent which is likely why he  is lived in thrived as long as he has.  Reviewed how hard dementia has been not only on him but on the family as a whole witnessing him decline and catering to his increasing physical needs. Adrienne expressed that in the long run she has some concerns about her mother's ability to continue caring for Issac given that she herself has her own physical impairments.  We reviewed the difficulties associated with being a full-time caregiver.  I shared the projected course of dementia and the reality that patients do get to a point where their body is unable to mobilize the way it once could and nutritionally they are unable to consume the way they once did.  We also reviewed how there is a lack of coordination between the brain to other parts of the body to pursue various activities.  I shared that the hope is that Lucioe does improve though the reality is that he may not.  We reviewed that if he does not it might be worthwhile to consider hospice care.    Adrienne requests additional resources for her mom, support groups for people who are contending with dementia and acting as caregivers.  I was able to provide some information regarding local caregiver support groups.  Vincente Liberty is very clear that her father had shared throughout his life that he did not want to be on life supportive measures.  We reviewed the MOST form that she and her mother had discussed as below:  Cardiopulmonary Resuscitation: Do Not Attempt Resuscitation (DNR/No CPR)  Medical Interventions: Limited Additional Interventions:  Use medical treatment, IV fluids and cardiac monitoring as indicated, DO NOT USE intubation or mechanical ventilation. May consider use of less invasive airway support such as BiPAP or CPAP. Also provide comfort measures. Transfer to the hospital if indicated. Avoid intensive care.   Antibiotics: Determine use of limitation of antibiotics when infection occurs  IV Fluids: IV fluids for a defined trial period   Feeding Tube: Feeding tube for a defined trial period   Questions and concerns addressed   Palliative Support Provided.   Objective Assessment: Vital Signs Vitals:   06/18/21 0005 06/18/21 0855  BP: (!) 107/52 121/69  Pulse: (!) 57 62  Resp: 15 14  Temp:  98.4 F (36.9 C)  SpO2: 100% 100%    Intake/Output Summary (Last 24 hours) at 06/18/2021 1106 Last data filed at 06/18/2021 0700 Gross per 24 hour  Intake 614.29 ml  Output 1300 ml  Net -685.71 ml   Last Weight  Most recent update: 06/14/2021  9:37 PM    Weight  60.1 kg (132 lb 7.9 oz)            Gen: Frail elderly African-American man HEENT: Dry mucous membranes CV: Regular rate and rhythm PULM: On room air ABD: soft/nontender EXT: No edema Neuro: Alert and oriented x1  SUMMARY OF RECOMMENDATIONS   DNAR/DNI  MOST Completed, paper copy placed onto the chart electric copy can be found in [facility]  DNR Form Completed, paper copy placed onto the chart electric copy can be found in [facility]  Caregiver resources for dementia support groups was provided  Ongoing PMT supprt  Time Spent: 90 minutes ______________________________________________________________________________________ [facility] Team Team Cell Phone: [phone] Please utilize secure chat with additional questions, if there is no response within 30 minutes please call the above phone number  Palliative Medicine Team providers are available by phone from 7am to 7pm daily and can be reached through the team cell phone.  Should this patient require assistance outside of these hours, please call the patient's attending physician.

## 2021-06-18 NOTE — Progress Notes (Signed)
° ° ° °  Subjective: Patient had a good night. Daughter at bedside. Did not try to mobilize worker. Social work came back to discuss likely SNF placement post discharge. Patient sleeping comfortably this morning, no issues.  Objective:   VITALS:   Vitals:   06/17/21 1604 06/17/21 1902 06/17/21 2050 06/18/21 0005  BP: (!) 100/51  (!) 112/51 (!) 107/52  Pulse: 65  (!) 56 (!) 57  Resp: $Remo'13  17 15  'nitNp$ Temp: 98.4 F (36.9 C)  97.7 F (36.5 C)   TempSrc:   Axillary   SpO2: 100% 100% 100% 100%  Weight:      Height:        Intact pulses distally Dorsiflexion/Plantar flexion intact Incision: dressing C/D/I Compartment soft Patient not answering questions regarding intact sensation bilateral lower extremity  Lab Results  Component Value Date   WBC 6.9 06/18/2021   HGB 9.6 (L) 06/18/2021   HCT 29.7 (L) 06/18/2021   MCV 97.7 06/18/2021   PLT 141 (L) 06/18/2021   BMET    Component Value Date/Time   NA 137 06/18/2021 0256   NA 138 04/05/2018 0000   K 4.1 06/18/2021 0256   CL 107 06/18/2021 0256   CO2 27 06/18/2021 0256   GLUCOSE 96 06/18/2021 0256   GLUCOSE 91 05/11/2006 1035   BUN 14 06/18/2021 0256   BUN 14 04/05/2018 0000   CREATININE 0.99 06/18/2021 0256   CREATININE 1.14 02/21/2021 1140   CALCIUM 8.7 (L) 06/18/2021 0256   EGFR 60 02/21/2021 1140   GFRNONAA >60 06/18/2021 0256   GFRNONAA 53 (L) 09/21/2020 1343     Assessment/Plan: 3 Days Post-Op   Principal Problem:   Fracture of femoral neck, left, closed ([place]) Active Problems:   Essential hypertension   Stage 3 chronic kidney disease (HCC)   UTI (urinary tract infection)   Urine retention   Dementia (HCC)   Acute encephalopathy   OSA on CPAP   Protein-calorie malnutrition, severe Left hip hemiarthroplasty for femoral neck fracture on 06/15/2021  Post op recs: WB: WBAT with posterior hip precautions x6 weeks Abx: will continue abx post op that he his getting for his UTI, currently on Unasyn Imaging: PACU  xrays Dressing: Aquacel dressing to be kept intact until follow-up DVT prophylaxis: lovenox starting POD1 x4 weeks Follow up: 2 weeks after surgery for a wound check with Dr. Zachery Dakins at [facility].  Address: [address] [place], [place], [place] [address]  Office Phone: [phone]    Willaim Sheng 06/18/2021, 7:29 AM   Charlies Constable, MD  Contact information:   [phone] 7am-5pm epic message Dr. Zachery Dakins, or call office for patient follow up: [phone] After hours and holidays please check [email or web address] for group call information for [facility]

## 2021-06-18 NOTE — Progress Notes (Signed)
PROGRESS NOTE  [name] GQQ:[id number] DOB: 1925-11-26   PCP: [name], NP  Patient is from: Home.  Lives with family.  DOA: 06/14/2021 LOS: 4  Chief complaints:  Chief Complaint  Patient presents with   Fall     Brief Narrative / Interim history: 88 year old M with PMH of dementia, HTN not on meds, BPH/TURP, chronic urinary tension with indwelling Foley, recurrent UTIs, OSA on CPAP and CKD-3 brought to ED with left hip and left shoulder pain after fall the night prior when he got up unassisted, and found to have left femoral neck fracture.  Orthopedic surgery consulted.  He was started on IV ceftriaxone.  Patient underwent bipolar hip hemiarthroplasty by Dr. Zachery Dakins on 06/15/2021.  Hospital course complicated by encephalopathy and hypotension.  Work-up unrevealing.  Hypotension seems to be resolved with p.o. midodrine and IV fluid.  Encephalopathy improved.    Palliative medicine consulted.  CODE STATUS changed to DNR/DNI.  Therapy recommended SNF.  Subjective: Seen and examined earlier this morning.  No major events overnight of this morning.  Patient's daughter at bedside.  No concerns or complaints.  Fair p.o. intake yesterday.  Did not get out of the bed.  Awake but lying with eyes closed.  Oriented only to self.  Objective: Vitals:   06/17/21 1902 06/17/21 2050 06/18/21 0005 06/18/21 0855  BP:  (!) 112/51 (!) 107/52 121/69  Pulse:  (!) 56 (!) 57 62  Resp:  17 15 14   Temp:  97.7 F (36.5 C)  98.4 F (36.9 C)  TempSrc:  Axillary  Oral  SpO2: 100% 100% 100% 100%  Weight:      Height:        Examination:  GENERAL: Frail looking elderly male.  Nontoxic. HEENT: MMM.  Vision and hearing grossly intact.  NECK: Supple.  No apparent JVD.  RESP: 100% on 1 L.  No IWOB.  Fair aeration bilaterally. CVS:  RRR. Heart sounds normal.  ABD/GI/GU: BS+. Abd soft, NTND.  MSK/EXT: Significant muscle mass and subcu fat loss.  Dressing over surgical site DCI. SKIN:  Dressing over surgical site DCI. NEURO: Awake but lying with eyes closed.  Oriented only to self.  Follows commands.  No apparent focal neuro deficit. PSYCH: Calm. Normal affect.   Procedures:  06/15/2021-bipolar hemiarthroplasty of left femoral neck fracture  Microbiology summarized: 1/3-COVID-19 and influenza PCR nonreactive. 1/3-urine culture with pansensitive Enterococcus faecalis 1/3-blood cultures NGTD 1/5-blood cultures NGTD   Assessment & Plan: Accidental fall at home Left hip fracture/closed left femoral neck fracture -Status post ORIF as above. -WBAT with posterior hip precautions x6 weeks -Pain control with scheduled Tylenol and as needed oxycodone -Bowel regimen and VTE prophylaxis  Catheter associated Enterococcus faecalis UTI-chronic Foley due to chronic urinary tension/BPH -Followed [facility] at [facility] where Foley was placed on 06/07/2021. -Urine culture on 12/27 grew Citrobacter and Serratia, and started on 10 days course of Bactrim on 12/30 -Urine culture here with pansensitive Enterococcus faecalis.  Blood cultures NGTD. -1/3 IV CTX>> 1/5 IV Unasyn>>> 1/7 p.o. amoxicillin>>  Hypotension/orthostatic hypotension: Work-up unrevealing.  Likely dehydration from poor p.o. intake.  Hypotension seems to have resolved. -Discontinue IV fluid, and encourage p.o. intake -Increased midodrine to 10 mg 3 times daily on 1/6.  Dementia with behavioral disturbance-increased confusion and hallucination lately.  CTH without acute finding.  Infectious work-up as above.  ABG normal. -Treat UTI as above -Reorientation and delirium precautions  AKI on CKD-3A: Likely due to poor p.o. intake.  AKI  resolved. Recent Labs    09/21/20 1343 02/21/21 1140 06/14/21 1027 06/15/21 0235 06/16/21 0257 06/16/21 1709 06/17/21 0153 06/18/21 0256  BUN 21 19 14 12 16 17 17 14   CREATININE 1.18* 1.14 1.51* 1.41* 1.27* 1.32* 1.19 0.99  -Discontinue IV fluid -Continue monitoring  OSA on CPAP:  Patient's wife concerned about CPAP worsening dementia and anemia.  Reassured about this and recommended continuing CPAP as long as he tolerates.  -Nightly CPAP as tolerated -Very minimum oxygen if he does not tolerate CPAP at night  Normocytic anemia: Slight drop in Hgb likely dilutional from IV fluid.  Anemia panel consistent with ACD. Recent Labs    09/21/20 1343 02/21/21 1140 06/14/21 1027 06/15/21 0235 06/16/21 0257 06/16/21 1709 06/17/21 0153 06/18/21 0256  HGB 12.2* 12.5* 13.5 11.3* 11.5* 11.6* 10.5* 9.6*  -Monitor H&H  Thrombocytopenia: Improved.   Recent Labs  Lab 06/14/21 1027 06/15/21 0235 06/16/21 0257 06/16/21 1709 06/17/21 0153 06/18/21 0256  PLT 163 125* 111* 128* 126* 141*  -Monitor.  Hyponatremia: Resolved.  Debility/physical deconditioning -PT/OT  Dysphagia: -Continue dysphagia 2 diet per SLP  Goal of care counseling:  -Appreciate help by palliative care-now DNR/DNI.  Severe malnutrition: Low BMI with significant muscle mass and subcu fat loss and poor p.o. intake. Body mass index is 20.75 kg/m. Nutrition Problem: Severe Malnutrition Etiology: chronic illness (dementia) Signs/Symptoms: severe fat depletion, severe muscle depletion Interventions: Ensure Enlive (each supplement provides 350kcal and 20 grams of protein), MVI, Refer to RD note for recommendations   DVT prophylaxis:  enoxaparin (LOVENOX) injection 40 mg Start: 06/16/21 0900 SCDs Start: 06/14/21 1952  Code Status: DNR/DNI Family Communication: Updated patient's daughter at bedside. Level of care: Progressive.  Change to MedSurg. Status is: Inpatient  Remains inpatient appropriate because: postop care after left hip fracture and safe disposition   Final disposition: TBD    Consultants:  Orthopedic surgery Palliative medicine   Sch Meds:  Scheduled Meds:  acetaminophen  1,000 mg Oral Q8H   amoxicillin  500 mg Oral Q12H   brimonidine  1 drop Left Eye BID    Chlorhexidine Gluconate Cloth  6 each Topical Daily   dorzolamide-timolol  1 drop Left Eye BID   enoxaparin  40 mg Subcutaneous Q24H   feeding supplement  237 mL Oral TID BM   midodrine  10 mg Oral TID WC   multivitamin with minerals  1 tablet Oral Daily   Continuous Infusions:   PRN Meds:.hydrALAZINE, melatonin, morphine injection, oxyCODONE, senna-docusate  Antimicrobials: Anti-infectives (From admission, onward)    Start     Dose/Rate Route Frequency Ordered Stop   06/18/21 1000  amoxicillin (AMOXIL) capsule 500 mg        500 mg Oral Every 12 hours 06/17/21 1304 06/23/21 0959   06/16/21 1600  Ampicillin-Sulbactam (UNASYN) 3 g in sodium chloride 0.9 % 100 mL IVPB        3 g 200 mL/hr over 30 Minutes Intravenous Every 8 hours 06/16/21 1527 06/17/21 2303   06/15/21 1300  cefTRIAXone (ROCEPHIN) 1 g in sodium chloride 0.9 % 100 mL IVPB  Status:  Discontinued        1 g 200 mL/hr over 30 Minutes Intravenous Every 24 hours 06/14/21 1954 06/16/21 1527   06/15/21 0600  ceFAZolin (ANCEF) IVPB 2g/100 mL premix        2 g 200 mL/hr over 30 Minutes Intravenous On call to O.R. 06/14/21 2047 06/15/21 0900   06/14/21 1245  cefTRIAXone (ROCEPHIN) 2 g in sodium chloride  0.9 % 100 mL IVPB        2 g 200 mL/hr over 30 Minutes Intravenous  Once 06/14/21 1235 06/14/21 1332        I have personally reviewed the following labs and images: CBC: Recent Labs  Lab 06/14/21 1027 06/15/21 0235 06/16/21 0257 06/16/21 1709 06/17/21 0153 06/18/21 0256  WBC 7.1 6.0 9.2 8.0 7.2 6.9  NEUTROABS 5.8  --   --   --   --   --   HGB 13.5 11.3* 11.5* 11.6* 10.5* 9.6*  HCT 41.1 35.4* 35.7* 36.0* 33.3* 29.7*  MCV 95.4 96.2 96.0 97.8 98.5 97.7  PLT 163 125* 111* 128* 126* 141*   BMP &GFR Recent Labs  Lab 06/15/21 0235 06/16/21 0257 06/16/21 1709 06/17/21 0153 06/18/21 0256  NA 134* 136 135 135 137  K 4.5 4.5 4.2 3.9 4.1  CL 101 104 103 103 107  CO2 24 25 25 25 27   GLUCOSE 114* 112* 107* 91 96   BUN 12 16 17 17 14   CREATININE 1.41* 1.27* 1.32* 1.19 0.99  CALCIUM 8.8* 8.7* 8.6* 8.4* 8.7*  MG  --  2.0  --  2.0 2.2  PHOS  --  3.4  --  3.0 2.6   Estimated Creatinine Clearance: 39.6 mL/min (by C-G formula based on SCr of 0.99 mg/dL). Liver & Pancreas: Recent Labs  Lab 06/14/21 1027 06/16/21 0257 06/16/21 1709 06/17/21 0153 06/18/21 0256  AST 27  --  23  --   --   ALT 17  --  11  --   --   ALKPHOS 177*  --  119  --   --   BILITOT 0.7  --  0.9  --   --   PROT 7.7  --  6.7  --   --   ALBUMIN 3.8 2.9* 3.3* 2.8* 2.4*   No results for input(s): LIPASE, AMYLASE in the last 168 hours. Recent Labs  Lab 06/17/21 0153  AMMONIA 21   Diabetic: No results for input(s): HGBA1C in the last 72 hours. Recent Labs  Lab 06/16/21 1617 06/17/21 0856  GLUCAP 111* 108*   Cardiac Enzymes: Recent Labs  Lab 06/17/21 0153  CKTOTAL 301   No results for input(s): PROBNP in the last 8760 hours. Coagulation Profile: No results for input(s): INR, PROTIME in the last 168 hours. Thyroid Function Tests: Recent Labs    06/16/21 1710  TSH 1.627   Lipid Profile: No results for input(s): CHOL, HDL, LDLCALC, TRIG, CHOLHDL, LDLDIRECT in the last 72 hours. Anemia Panel: Recent Labs    06/16/21 0257  VITAMINB12 351  FOLATE 14.9  FERRITIN 159  TIBC 207*  IRON 25*  RETICCTPCT 0.9   Urine analysis:    Component Value Date/Time   COLORURINE YELLOW 06/14/2021 1220   APPEARANCEUR CLEAR 06/14/2021 1220   LABSPEC 1.015 06/14/2021 1220   PHURINE 7.5 06/14/2021 1220   GLUCOSEU NEGATIVE 06/14/2021 1220   GLUCOSEU NEGATIVE 01/04/2017 1025   HGBUR NEGATIVE 06/14/2021 1220   BILIRUBINUR NEGATIVE 06/14/2021 1220   KETONESUR NEGATIVE 06/14/2021 1220   PROTEINUR NEGATIVE 06/14/2021 1220   UROBILINOGEN 0.2 01/04/2017 1025   NITRITE NEGATIVE 06/14/2021 1220   LEUKOCYTESUR SMALL (A) 06/14/2021 1220   Sepsis Labs: Invalid input(s): PROCALCITONIN, [place]  Microbiology: Recent Results  (from the past 240 hour(s))  Resp Panel by RT-PCR (Flu A&B, Covid) Peripheral     Status: None   Collection Time: 06/14/21 10:27 AM   Specimen: Peripheral; Nasopharyngeal(NP) swabs in vial  transport medium  Result Value Ref Range Status   SARS Coronavirus 2 by RT PCR NEGATIVE NEGATIVE Final    Comment: (NOTE) SARS-CoV-2 target nucleic acids are NOT DETECTED.  The SARS-CoV-2 RNA is generally detectable in upper respiratory specimens during the acute phase of infection. The lowest concentration of SARS-CoV-2 viral copies this assay can detect is 138 copies/mL. A negative result does not preclude SARS-Cov-2 infection and should not be used as the sole basis for treatment or other patient management decisions. A negative result may occur with  improper specimen collection/handling, submission of specimen other than nasopharyngeal swab, presence of viral mutation(s) within the areas targeted by this assay, and inadequate number of viral copies(<138 copies/mL). A negative result must be combined with clinical observations, patient history, and epidemiological information. The expected result is Negative.  Fact Sheet for Patients:  EntrepreneurPulse.com.au  Fact Sheet for Healthcare Providers:  IncredibleEmployment.be  This test is no t yet approved or cleared by the [place] FDA and  has been authorized for detection and/or diagnosis of SARS-CoV-2 by FDA under an Emergency Use Authorization (EUA). This EUA will remain  in effect (meaning this test can be used) for the duration of the COVID-19 declaration under Section 564(b)(1) of the Act, 21 U.S.C.section 360bbb-3(b)(1), unless the authorization is terminated  or revoked sooner.       Influenza A by PCR NEGATIVE NEGATIVE Final   Influenza B by PCR NEGATIVE NEGATIVE Final    Comment: (NOTE) The Xpert Xpress SARS-CoV-2/FLU/RSV plus assay is intended as an aid in the diagnosis of influenza  from Nasopharyngeal swab specimens and should not be used as a sole basis for treatment. Nasal washings and aspirates are unacceptable for Xpert Xpress SARS-CoV-2/FLU/RSV testing.  Fact Sheet for Patients: EntrepreneurPulse.com.au  Fact Sheet for Healthcare Providers: IncredibleEmployment.be  This test is not yet approved or cleared by the [place] FDA and has been authorized for detection and/or diagnosis of SARS-CoV-2 by FDA under an Emergency Use Authorization (EUA). This EUA will remain in effect (meaning this test can be used) for the duration of the COVID-19 declaration under Section 564(b)(1) of the Act, 21 U.S.C. section 360bbb-3(b)(1), unless the authorization is terminated or revoked.  Performed at [facility], [address] [place], [place] [address]   Blood culture (routine x 2)     Status: None (Preliminary result)   Collection Time: 06/14/21 10:28 AM   Specimen: BLOOD  Result Value Ref Range Status   Specimen Description   Final    BLOOD Blood Culture adequate volume Performed at [facility], [address] [place], [place] [address]    Special Requests   Final    BOTTLES DRAWN AEROBIC AND ANAEROBIC LEFT ANTECUBITAL Performed at [facility], [address] [place], [place] [address]    Culture   Final    NO GROWTH 4 DAYS Performed at [facility], [place] [address] [place], [place] [address]    Report Status PENDING  Incomplete  Urine Culture     Status: Abnormal   Collection Time: 06/14/21 12:20 PM   Specimen: Urine, Clean Catch  Result Value Ref Range Status   Specimen Description   Final    URINE, CLEAN CATCH Performed at [facility], [address] [place], [place] [address]    Special Requests   Final    NONE Performed at [facility], [address] [place],  [place] [address]    Culture >=100,000 COLONIES/mL ENTEROCOCCUS FAECALIS (A)   Final   Report Status 06/17/2021 FINAL  Final   Organism ID, Bacteria ENTEROCOCCUS FAECALIS (A)  Final      Susceptibility   Enterococcus faecalis - MIC*    AMPICILLIN <=2 SENSITIVE Sensitive     NITROFURANTOIN <=16 SENSITIVE Sensitive     VANCOMYCIN 1 SENSITIVE Sensitive     * >=100,000 COLONIES/mL ENTEROCOCCUS FAECALIS  Blood culture (routine x 2)     Status: None (Preliminary result)   Collection Time: 06/14/21  6:42 PM   Specimen: BLOOD  Result Value Ref Range Status   Specimen Description BLOOD SITE NOT SPECIFIED  Final   Special Requests   Final    BOTTLES DRAWN AEROBIC AND ANAEROBIC Blood Culture adequate volume   Culture   Final    NO GROWTH 4 DAYS Performed at [facility], [place] [address] [place], [place] [address]    Report Status PENDING  Incomplete  Surgical pcr screen     Status: None   Collection Time: 06/14/21  8:45 PM   Specimen: Nasal Mucosa; Nasal Swab  Result Value Ref Range Status   MRSA, PCR NEGATIVE NEGATIVE Final   Staphylococcus aureus NEGATIVE NEGATIVE Final    Comment: (NOTE) The Xpert SA Assay (FDA approved for NASAL specimens in patients 34 years of age and older), is one component of a comprehensive surveillance program. It is not intended to diagnose infection nor to guide or monitor treatment. Performed at [facility], [place] [address] [place], [place] [address]   Culture, blood (routine x 2)     Status: None (Preliminary result)   Collection Time: 06/16/21  5:09 PM   Specimen: BLOOD  Result Value Ref Range Status   Specimen Description BLOOD BLOOD RIGHT FOREARM  Final   Special Requests   Final    BOTTLES DRAWN AEROBIC AND ANAEROBIC Blood Culture adequate volume   Culture   Final    NO GROWTH 2 DAYS Performed at [facility], [place] [address], [place], [place] [address]    Report Status PENDING  Incomplete  Culture, blood (routine x 2)     Status: None (Preliminary result)   Collection Time: 06/16/21  5:10 PM    Specimen: BLOOD  Result Value Ref Range Status   Specimen Description BLOOD BLOOD RIGHT FOREARM  Final   Special Requests   Final    BOTTLES DRAWN AEROBIC AND ANAEROBIC Blood Culture adequate volume   Culture   Final    NO GROWTH 2 DAYS Performed at [facility], [place] [address], [place], [place] [address]    Report Status PENDING  Incomplete    Radiology Studies: No results found.    Adoria Kawamoto T. [place]  If 7PM-7AM, please contact night-coverage [email or web address] 06/18/2021, 12:43 PM

## 2021-06-18 NOTE — Consult Note (Addendum)
Palliative Medicine Inpatient Consult Note  Consulting Provider: Mercy Riding, MD  Reason for consult:   [name] Palliative Medicine Consult  Reason for Consult? Goal of care    HPI:  Per intake H&P --> 88 year old M with PMH of dementia, HTN not on meds, BPH/TURP, chronic urinary tension with indwelling Foley, recurrent UTIs, OSA on CPAP and CKD-3 brought to ED with left hip and left shoulder pain after fall the night prior when he got up unassisted, and found to have left femoral neck fracture.  Orthopedic surgery consulted.  He was started on IV ceftriaxone.   Patient underwent bipolar hip hemiarthroplasty by Dr. Zachery Dakins on 06/15/2021.  Hospital course complicated by encephalopathy and hypotension.  Hypotension improved after IV fluid and midodrine.  Palliative care has been asked to get involved in the setting of progressive dementia to further discuss goals of care and CODE STATUS.  Clinical Assessment/Goals of Care:  *Please note that this is a verbal dictation therefore any spelling or grammatical errors are due to the "[place]" system interpretation.  I have reviewed medical records including EPIC notes, labs and imaging, received report from bedside RN, assessed the patient who is lying in bed in no acute distress.    I met with patient's wife, [name] and son [name] to further discuss diagnosis prognosis, [place], EOL wishes, disposition and options.   I introduced Palliative Medicine as specialized medical care for people living with serious illness. It focuses on providing relief from the symptoms and stress of a serious illness. The goal is to improve quality of life for both the patient and the family.  Medical History Review and Understanding: Clear he shares with me that [place] has had dementia since 2013.  She shares that it something which over the years has been worsening to the degree that now his mobility is quite limited.  She  expresses that she sees neurology regularly who have endorsed most recently that his dementia is advancing.  His health decline has been marked since August 2019.  Regarding additional health history he has a chronic indwelling catheter due to BPH which is susceptible to recurrent UTIs.  He has had multiple bouts of septicemia in the past due to urinary infections and septic joints.  We discussed [name]'s reason for hospitalization which was due to a left hip fracture after having fallen which has since got a surgical repair.  Social History: [name] is from [place].  He and his wife presently live in a home in [place].  He is oldest of 6 children all boys the one is since deceased.  He has been with his wife since 42 and on the 20th of this month they [place] been together for 66 years.  They share 3 daughters and 1 son as well as 6 grandchildren. 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  He has a strong faith and was an active deacon in his church.  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   Functional and Nutritional State: [name] with mobility and uses assistance to walk.  His wife has a transportation chair to get to and from medical appointments which their son [name] assists with.  [name] requires assistance with basic activities of daily living.  His wife and their 4 children help dividing the responsibilities  of caring for [name].  Sometimes an additional caregiver comes in to offer assistance.  He has had a robust appetite despite his other physical impairments.  Per his wife he used to weigh around 250 pounds and is now roughly 115 pounds.  Palliative Considerations: We reviewed that dementia is a chronic disease and often progresses to the point where patients have decreased functional abilities as well as impaired  nutritional intake.  We discussed the reasons why this occurs in the setting of the mind not being able to communicate causing a discoordination of abilities to do tasks such as swallowing.  We reviewed that over time dementia patients often get to a point where they are bedbound and unable to provide any self-care.  This often leads to a variety of health concerns such as recurrent infections like pneumonia or urinary infections or pressure injuries.  I did share that in the setting of [name]'s already significant memory impairment that his rehabilitation journey may be complicated.  Reviewed that if he continues to decline as opposed to improve this is one topic such as hospice or introduced. I described hospice as a service for patients who have a life expectancy of 6 months or less. The goal of hospice is the preservation of dignity and quality at the end phases of life. Under hospice care, the focus changes from curative to symptom relief.   Advance Directives: Advance Directives can be found in [place].  Code Status: Concepts specific to code status, artifical feeding and hydration, continued IV antibiotics and rehospitalization was had.  A MOST form was introduced and reviewed for completion, [name] would like to discuss it with her daughter prior to making it an official document. She does share that [place] had been an established DNR and reviewed that he would not survive CPR, also endorses worry about fracturing his ribs.   Goals for the Future: At this time patient's family is hopeful for improvement and for him to be able to either go to the aggressive rehabilitation unit, CIR or home with rehab.  Discussed the importance of continued conversation with family and their  medical providers regarding overall plan of care and treatment options, ensuring decisions are within the context of the patients values and GOCs.  Decision Maker: [name] (daughter) & [name] (spouse)  per HCPOA documents  SUMMARY OF RECOMMENDATIONS   DNAR/DNI  MOST provided for family to review and complete  Education on dementia and hip fx was had. Discussion of what to do if patient negelcts to improve  Goals: Physical improvement post op  Ongoing PMT supprt  Code Status/Advance Care Planning: DNAR/DNI  Palliative Prophylaxis:  Aspiration, Bowel Regimen, Delirium Protocol, Frequent Pain Assessment, Oral Care, Palliative Wound Care, and Turn Reposition  Additional Recommendations (Limitations, Scope, Preferences): Continue current scope of care  Psycho-social/Spiritual:  Desire for further Chaplaincy support: No Additional Recommendations: Education on progression of dementia   Prognosis: Worrisome in the setting of advanced dementia, declining functional state, new hi fracture. High 12 month mortality risk.   Discharge Planning: Discharge likely to skilled nursing.  Vitals:   06/17/21 2050 06/18/21 0005  BP: (!) 112/51 (!) 107/52  Pulse: (!) 56 (!) 57  Resp: 17 15  Temp: 97.7 F (36.5 C)   SpO2: 100% 100%    Intake/Output Summary (Last 24 hours) at 06/18/2021 0628 Last data filed at 06/18/2021 0423 Gross per 24 hour  Intake 614.29 ml  Output 1900 ml  Net -1285.71 ml   Last Weight  Most recent update: 06/14/2021  9:37 PM    Weight  60.1 kg (132 lb 7.9 oz)            Gen: Frail elderly African-American man HEENT: Dry mucous membranes CV: Regular rate and rhythm PULM: On room air ABD: soft/nontender EXT: No edema Neuro: Alert and oriented x1  PPS: 20%   This conversation/these recommendations were discussed with patient primary care team, Dr. Cyndia Skeeters  Time In: 1640 Time Out: 1803 Total Time: 83  MDM High - (+)1 chronic illness, (+)1 acute illness being hip fx, decisions on goals in the setting of escalations/de-escalation of care and advance care planning.  ______________________________________________________ [place] Team Team Cell Phone: [phone] Please utilize secure chat with additional questions, if there is no response within 30 minutes please call the above phone number  Palliative Medicine Team providers are available by phone from 7am to 7pm daily and can be reached through the team cell phone.  Should this patient require assistance outside of these hours, please call the patient's attending physician.

## 2021-06-19 LAB — RENAL FUNCTION PANEL
Albumin: 2.5 g/dL — ABNORMAL LOW (ref 3.5–5.0)
Anion gap: 8 (ref 5–15)
BUN: 16 mg/dL (ref 8–23)
CO2: 27 mmol/L (ref 22–32)
Calcium: 8.8 mg/dL — ABNORMAL LOW (ref 8.9–10.3)
Chloride: 105 mmol/L (ref 98–111)
Creatinine, Ser: 1.07 mg/dL (ref 0.61–1.24)
GFR, Estimated: >60 mL/min (ref >60)
Glucose, Bld: 90 mg/dL (ref 70–99)
Phosphorus: 3.1 mg/dL (ref 2.5–4.6)
Potassium: 4.2 mmol/L (ref 3.5–5.1)
Sodium: 140 mmol/L (ref 135–145)

## 2021-06-19 LAB — CBC
HCT: 30.3 % — ABNORMAL LOW (ref 39.0–52.0)
Hemoglobin: 9.8 g/dL — ABNORMAL LOW (ref 13.0–17.0)
MCH: 31.9 pg (ref 26.0–34.0)
MCHC: 32.3 g/dL (ref 30.0–36.0)
MCV: 98.7 fL (ref 80.0–100.0)
Platelets: 154 10*3/uL (ref 150–400)
RBC: 3.07 MIL/uL — ABNORMAL LOW (ref 4.22–5.81)
RDW: 13.2 % (ref 11.5–15.5)
WBC: 6.9 10*3/uL (ref 4.0–10.5)
nRBC: 0 % (ref 0.0–0.2)

## 2021-06-19 LAB — CULTURE, BLOOD (ROUTINE X 2)
Culture: NO GROWTH
Culture: NO GROWTH
Special Requests: ADEQUATE
Specimen Description: ADEQUATE

## 2021-06-19 LAB — MAGNESIUM: Magnesium: 2.1 mg/dL (ref 1.7–2.4)

## 2021-06-19 NOTE — Progress Notes (Signed)
Palliative Medicine Inpatient Follow Up Note  Consulting Provider: Mercy Riding, MD   Reason for consult:   [facility] Consult  Reason for Consult? Goal of care    HPI:  Per intake H&P --> 88 year old M with PMH of dementia, HTN not on meds, BPH/TURP, chronic urinary tension with indwelling Foley, recurrent UTIs, OSA on CPAP and CKD-3 brought to ED with left hip and left shoulder pain after fall the night prior when he got up unassisted, and found to have left femoral neck fracture.  Orthopedic surgery consulted.  He was started on IV ceftriaxone.   Patient underwent bipolar hip hemiarthroplasty by Dr. Zachery Dakins on 06/15/2021.  Hospital course complicated by encephalopathy and hypotension.  Hypotension improved after IV fluid and midodrine.   Palliative care has been asked to get involved in the setting of progressive dementia to further discuss goals of care and CODE STATUS.  Today's Discussion (06/19/2021):  *Please note that this is a verbal dictation therefore any spelling or grammatical errors are due to the "[employer] [employer]" system interpretation.  Chart reviewed inclusive of progress notes, laboratory results, and diagnostic images.   I met with [name] and his daughter this afternoon. She shares with me that [name] has not gotten out of bed in a number of days. Nursing staff and myself were able to get [name] out of bed to the chair. He required maximum assistance. Per his daughter his appetite has been poor today. She states that she is trying to motivate him to eat. I recommended bringing food in from home as this may be a comfort to him.  We reviewed that hip fractures in the elderly have a worrisome mortality rate at one year post operative of roughly 30%. I shared that much of the success is based upon patients ability to tolerate a rehabilitation course. We reviewed the importance of continued mobility for strength and also to  decrease risks of pneumonia and pressure injuries. Discussed the importance of mobility. Reviewed that if [name] does not do well at rehab then we would discuss next steps such as hospice care. Patients daughter is very motivated to continue to seek improvements for [name]'s condition.  From the perspective of pain, he is receiving tylenol 1g TID though prior to mobility he would benefit from low dose oxycodone or in the setting of acute pain morphine IVP. Staff made aware.  Questions and concerns addressed   Palliative Support Provided.   Objective Assessment: Vital Signs Vitals:   06/19/21 0451 06/19/21 0800  BP: (!) 106/53 110/68  Pulse: (!) 57 (!) 59  Resp: 15   Temp: 98.3 F (36.8 C)   SpO2: 98% 100%    Intake/Output Summary (Last 24 hours) at 06/19/2021 1401 Last data filed at 06/19/2021 1000 Gross per 24 hour  Intake 60 ml  Output 250 ml  Net -190 ml    Last Weight  Most recent update: 06/14/2021  9:37 PM    Weight  60.1 kg (132 lb 7.9 oz)            Gen: Frail elderly African-American man HEENT: Dry mucous membranes CV: Regular rate and rhythm PULM: On room air ABD: soft/nontender EXT: No edema Neuro: Alert and oriented x1  SUMMARY OF RECOMMENDATIONS   DNAR/DNI  MOST / DNR Form Completed, paper copy placed onto the chart electric copy can be found in [facility]  Caregiver resources for dementia support groups was provided  Family hopeful [name] can  transition to SNF for short term  Ongoing incremental PMT support  High - In the setting of hip fx s/p repair and ongoing symptom support as well as family counseling regarding he what if's post hospitalization and the discussions circulation around escalation or de-escalation of care. ______________________________________________________________________________________ [facility] Team Team Cell Phone: [phone] Please utilize secure chat with additional questions, if there  is no response within 30 minutes please call the above phone number  Palliative Medicine Team providers are available by phone from 7am to 7pm daily and can be reached through the team cell phone.  Should this patient require assistance outside of these hours, please call the patient's attending physician.

## 2021-06-19 NOTE — Progress Notes (Signed)
PROGRESS NOTE  [name] LEX:[id number] DOB: 01-26-1926   PCP: [name], NP  Patient is from: Home.  Lives with family.  DOA: 06/14/2021 LOS: 5  Chief complaints:  Chief Complaint  Patient presents with   Fall     Brief Narrative / Interim history: 88 year old M with PMH of dementia, HTN not on meds, BPH/TURP, chronic urinary tension with indwelling Foley, recurrent UTIs, OSA on CPAP and CKD-3 brought to ED with left hip and left shoulder pain after fall the night prior when he got up unassisted, and found to have left femoral neck fracture.  Orthopedic surgery consulted.  He was started on IV ceftriaxone.  Patient underwent bipolar hip hemiarthroplasty by Dr. Zachery Dakins on 06/15/2021.  Hospital course complicated by encephalopathy and hypotension.  Work-up unrevealing.  Hypotension seems to be resolved with p.o. midodrine and IV fluid.  Encephalopathy improved.    Palliative medicine consulted.  CODE STATUS changed to DNR/DNI.  Therapy recommended SNF.  Subjective: Seen and examined earlier this morning.  No major events overnight of this morning.  Patient's daughter at bedside.  Tolerated CPAP last night.  Ate some of his breakfast today.  No complaints but not a reliable historian.  He responds no to pain and yes to hungry.   Objective: Vitals:   06/18/21 2129 06/19/21 0012 06/19/21 0451 06/19/21 0800  BP:  (!) 91/48 (!) 106/53 110/68  Pulse: (!) 57 (!) 56 (!) 57 (!) 59  Resp: 15 20 15    Temp:  98 F (36.7 C) 98.3 F (36.8 C)   TempSrc:  Oral Oral   SpO2:  99% 98% 100%  Weight:      Height:        Examination: GENERAL: Frail looking elderly male.  Nontoxic. HEENT: MMM.  Impaired vision.  Hearing grossly intact. NECK: Supple.  No apparent JVD.  RESP: 100% on RA.  No IWOB.  Fair aeration bilaterally. CVS:  RRR. Heart sounds normal.  ABD/GI/GU: BS+. Abd soft, NTND.  Indwelling Foley in place. MSK/EXT:  Moves extremities.  Significant muscle mass and subcu  fat loss. SKIN: Dressing over surgical site DCI. NEURO: Awake but lying with eyes closed.  Oriented to self.  Follows commands.  No apparent focal neuro deficit. PSYCH: Calm. Normal affect.   Procedures:  06/15/2021-bipolar hemiarthroplasty of left femoral neck fracture  Microbiology summarized: 1/3-COVID-19 and influenza PCR nonreactive. 1/3-urine culture with pansensitive Enterococcus faecalis 1/3-blood cultures NGTD 1/5-blood cultures NGTD   Assessment & Plan: Accidental fall at home Left hip fracture/closed left femoral neck fracture -Status post ORIF as above. -WBAT with posterior hip precautions x6 weeks -Pain control with scheduled Tylenol and as needed oxycodone -Bowel regimen and VTE prophylaxis  Catheter associated Enterococcus faecalis UTI-chronic Foley due to chronic urinary tension/BPH -Followed by urology at [facility] where Foley was placed on 06/07/2021. -Urine culture on 12/27 grew Citrobacter and Serratia, and started on 10 days course of Bactrim on 12/30 -Urine culture here with pansensitive Enterococcus faecalis.  Blood cultures NGTD. -1/3 IV CTX>> 1/5 IV Unasyn>>> 1/7 p.o. amoxicillin>> 1/12  Hypotension/orthostatic hypotension: Work-up unrevealing.  Likely dehydration from poor p.o. intake.  Hypotension seems to have resolved. -Increased midodrine to 10 mg 3 times daily on 1/6. -Encourage oral intake  Dementia with behavioral disturbance-increased confusion and hallucination lately.  CTH without acute finding.  Infectious work-up as above.  ABG normal. -Treat UTI as above -Reorientation and delirium precautions  AKI on CKD-3A: Likely due to poor p.o. intake.  AKI resolved.  Recent Labs    09/21/20 1343 02/21/21 1140 06/14/21 1027 06/15/21 0235 06/16/21 0257 06/16/21 1709 06/17/21 0153 06/18/21 0256 06/19/21 0357  BUN 21 19 14 12 16 17 17 14 16   CREATININE 1.18* 1.14 1.51* 1.41* 1.27* 1.32* 1.19 0.99 1.07  -Monitor intermittently.  OSA on CPAP:  Patient's wife concerned about CPAP worsening dementia and anemia.  Reassured about this and recommended continuing CPAP as long as he tolerates.  -Nightly CPAP as tolerated  Normocytic anemia: Slight drop in Hgb likely dilutional from IV fluid.  Anemia panel consistent with ACD. Recent Labs    09/21/20 1343 02/21/21 1140 06/14/21 1027 06/15/21 0235 06/16/21 0257 06/16/21 1709 06/17/21 0153 06/18/21 0256 06/19/21 0357  HGB 12.2* 12.5* 13.5 11.3* 11.5* 11.6* 10.5* 9.6* 9.8*  -Monitor H&H  Thrombocytopenia: Resolved. Recent Labs  Lab 06/14/21 1027 06/15/21 0235 06/16/21 0257 06/16/21 1709 06/17/21 0153 06/18/21 0256 06/19/21 0357  PLT 163 125* 111* 128* 126* 141* 154   Hyponatremia: Resolved.  Debility/physical deconditioning -PT/OT recommended SNF.  Dysphagia: -Continue dysphagia 2 diet per SLP  Goal of care counseling:  -Appreciate help by palliative care-now DNR/DNI.  Severe malnutrition: Low BMI with significant muscle mass and subcu fat loss and poor p.o. intake. Body mass index is 20.75 kg/m. Nutrition Problem: Severe Malnutrition Etiology: chronic illness (dementia) Signs/Symptoms: severe fat depletion, severe muscle depletion Interventions: Ensure Enlive (each supplement provides 350kcal and 20 grams of protein), MVI, Refer to RD note for recommendations   DVT prophylaxis:  enoxaparin (LOVENOX) injection 40 mg Start: 06/16/21 0900 SCDs Start: 06/14/21 1952  Code Status: DNR/DNI Family Communication: Updated patient's daughter at bedside. Level of care: Med-Surg.  Change to MedSurg. Status is: Inpatient  Remains inpatient appropriate because: postop care after left hip fracture and safe disposition   Final disposition: SNF    Consultants:  Orthopedic surgery Palliative medicine   Sch Meds:  Scheduled Meds:  acetaminophen  1,000 mg Oral Q8H   amoxicillin  500 mg Oral Q12H   brimonidine  1 drop Left Eye BID   Chlorhexidine Gluconate Cloth   6 each Topical Daily   dorzolamide-timolol  1 drop Left Eye BID   enoxaparin  40 mg Subcutaneous Q24H   feeding supplement  237 mL Oral TID BM   midodrine  10 mg Oral TID WC   multivitamin with minerals  1 tablet Oral Daily   Continuous Infusions:   PRN Meds:.hydrALAZINE, melatonin, morphine injection, oxyCODONE, senna-docusate  Antimicrobials: Anti-infectives (From admission, onward)    Start     Dose/Rate Route Frequency Ordered Stop   06/18/21 1000  amoxicillin (AMOXIL) capsule 500 mg        500 mg Oral Every 12 hours 06/17/21 1304 06/23/21 0959   06/16/21 1600  Ampicillin-Sulbactam (UNASYN) 3 g in sodium chloride 0.9 % 100 mL IVPB        3 g 200 mL/hr over 30 Minutes Intravenous Every 8 hours 06/16/21 1527 06/17/21 2303   06/15/21 1300  cefTRIAXone (ROCEPHIN) 1 g in sodium chloride 0.9 % 100 mL IVPB  Status:  Discontinued        1 g 200 mL/hr over 30 Minutes Intravenous Every 24 hours 06/14/21 1954 06/16/21 1527   06/15/21 0600  ceFAZolin (ANCEF) IVPB 2g/100 mL premix        2 g 200 mL/hr over 30 Minutes Intravenous On call to O.R. 06/14/21 2047 06/15/21 0900   06/14/21 1245  cefTRIAXone (ROCEPHIN) 2 g in sodium chloride 0.9 % 100 mL IVPB  2 g 200 mL/hr over 30 Minutes Intravenous  Once 06/14/21 1235 06/14/21 1332        I have personally reviewed the following labs and images: CBC: Recent Labs  Lab 06/14/21 1027 06/15/21 0235 06/16/21 0257 06/16/21 1709 06/17/21 0153 06/18/21 0256 06/19/21 0357  WBC 7.1   < > 9.2 8.0 7.2 6.9 6.9  NEUTROABS 5.8  --   --   --   --   --   --   HGB 13.5   < > 11.5* 11.6* 10.5* 9.6* 9.8*  HCT 41.1   < > 35.7* 36.0* 33.3* 29.7* 30.3*  MCV 95.4   < > 96.0 97.8 98.5 97.7 98.7  PLT 163   < > 111* 128* 126* 141* 154   < > = values in this interval not displayed.   BMP &GFR Recent Labs  Lab 06/16/21 0257 06/16/21 1709 06/17/21 0153 06/18/21 0256 06/19/21 0357  NA 136 135 135 137 140  K 4.5 4.2 3.9 4.1 4.2  CL 104 103  103 107 105  CO2 25 25 25 27 27   GLUCOSE 112* 107* 91 96 90  BUN 16 17 17 14 16   CREATININE 1.27* 1.32* 1.19 0.99 1.07  CALCIUM 8.7* 8.6* 8.4* 8.7* 8.8*  MG 2.0  --  2.0 2.2 2.1  PHOS 3.4  --  3.0 2.6 3.1   Estimated Creatinine Clearance: 36.7 mL/min (by C-G formula based on SCr of 1.07 mg/dL). Liver & Pancreas: Recent Labs  Lab 06/14/21 1027 06/16/21 0257 06/16/21 1709 06/17/21 0153 06/18/21 0256 06/19/21 0357  AST 27  --  23  --   --   --   ALT 17  --  11  --   --   --   ALKPHOS 177*  --  119  --   --   --   BILITOT 0.7  --  0.9  --   --   --   PROT 7.7  --  6.7  --   --   --   ALBUMIN 3.8 2.9* 3.3* 2.8* 2.4* 2.5*   No results for input(s): LIPASE, AMYLASE in the last 168 hours. Recent Labs  Lab 06/17/21 0153  AMMONIA 21   Diabetic: No results for input(s): HGBA1C in the last 72 hours. Recent Labs  Lab 06/16/21 1617 06/17/21 0856 06/18/21 1338 06/18/21 2056  GLUCAP 111* 108* 109* 143*   Cardiac Enzymes: Recent Labs  Lab 06/17/21 0153  CKTOTAL 301   No results for input(s): PROBNP in the last 8760 hours. Coagulation Profile: No results for input(s): INR, PROTIME in the last 168 hours. Thyroid Function Tests: Recent Labs    06/16/21 1710  TSH 1.627   Lipid Profile: No results for input(s): CHOL, HDL, LDLCALC, TRIG, CHOLHDL, LDLDIRECT in the last 72 hours. Anemia Panel: No results for input(s): VITAMINB12, FOLATE, FERRITIN, TIBC, IRON, RETICCTPCT in the last 72 hours.  Urine analysis:    Component Value Date/Time   COLORURINE YELLOW 06/14/2021 1220   APPEARANCEUR CLEAR 06/14/2021 1220   LABSPEC 1.015 06/14/2021 1220   PHURINE 7.5 06/14/2021 1220   GLUCOSEU NEGATIVE 06/14/2021 1220   GLUCOSEU NEGATIVE 01/04/2017 1025   HGBUR NEGATIVE 06/14/2021 1220   BILIRUBINUR NEGATIVE 06/14/2021 1220   KETONESUR NEGATIVE 06/14/2021 1220   PROTEINUR NEGATIVE 06/14/2021 1220   UROBILINOGEN 0.2 01/04/2017 1025   NITRITE NEGATIVE 06/14/2021 1220    LEUKOCYTESUR SMALL (A) 06/14/2021 1220   Sepsis Labs: Invalid input(s): PROCALCITONIN, LACTICIDVEN  Microbiology: Recent Results (from the  past 240 hour(s))  Resp Panel by RT-PCR (Flu A&B, Covid) Peripheral     Status: None   Collection Time: 06/14/21 10:27 AM   Specimen: Peripheral; Nasopharyngeal(NP) swabs in vial transport medium  Result Value Ref Range Status   SARS Coronavirus 2 by RT PCR NEGATIVE NEGATIVE Final    Comment: (NOTE) SARS-CoV-2 target nucleic acids are NOT DETECTED.  The SARS-CoV-2 RNA is generally detectable in upper respiratory specimens during the acute phase of infection. The lowest concentration of SARS-CoV-2 viral copies this assay can detect is 138 copies/mL. A negative result does not preclude SARS-Cov-2 infection and should not be used as the sole basis for treatment or other patient management decisions. A negative result may occur with  improper specimen collection/handling, submission of specimen other than nasopharyngeal swab, presence of viral mutation(s) within the areas targeted by this assay, and inadequate number of viral copies(<138 copies/mL). A negative result must be combined with clinical observations, patient history, and epidemiological information. The expected result is Negative.  Fact Sheet for Patients:  EntrepreneurPulse.com.au  Fact Sheet for Healthcare Providers:  IncredibleEmployment.be  This test is no t yet approved or cleared by the [place] FDA and  has been authorized for detection and/or diagnosis of SARS-CoV-2 by FDA under an Emergency Use Authorization (EUA). This EUA will remain  in effect (meaning this test can be used) for the duration of the COVID-19 declaration under Section 564(b)(1) of the Act, 21 U.S.C.section 360bbb-3(b)(1), unless the authorization is terminated  or revoked sooner.       Influenza A by PCR NEGATIVE NEGATIVE Final   Influenza B by PCR NEGATIVE  NEGATIVE Final    Comment: (NOTE) The Xpert Xpress SARS-CoV-2/FLU/RSV plus assay is intended as an aid in the diagnosis of influenza from Nasopharyngeal swab specimens and should not be used as a sole basis for treatment. Nasal washings and aspirates are unacceptable for Xpert Xpress SARS-CoV-2/FLU/RSV testing.  Fact Sheet for Patients: EntrepreneurPulse.com.au  Fact Sheet for Healthcare Providers: IncredibleEmployment.be  This test is not yet approved or cleared by the [place] FDA and has been authorized for detection and/or diagnosis of SARS-CoV-2 by FDA under an Emergency Use Authorization (EUA). This EUA will remain in effect (meaning this test can be used) for the duration of the COVID-19 declaration under Section 564(b)(1) of the Act, 21 U.S.C. section 360bbb-3(b)(1), unless the authorization is terminated or revoked.  Performed at [facility], [address] [place], [place] [address]   Blood culture (routine x 2)     Status: None   Collection Time: 06/14/21 10:28 AM   Specimen: BLOOD  Result Value Ref Range Status   Specimen Description   Final    BLOOD Blood Culture adequate volume Performed at [facility], [address] [place], [place] [address]    Special Requests   Final    BOTTLES DRAWN AEROBIC AND ANAEROBIC LEFT ANTECUBITAL Performed at [facility], [address] [place], [place] [address]    Culture   Final    NO GROWTH 5 DAYS Performed at [facility], [place] [address] [place], [place] [address]    Report Status 06/19/2021 FINAL  Final  Urine Culture     Status: Abnormal   Collection Time: 06/14/21 12:20 PM   Specimen: Urine, Clean Catch  Result Value Ref Range Status   Specimen Description   Final    URINE, CLEAN CATCH Performed at [facility],  [place], [place] [address]    Special Requests   Final    NONE Performed at [facility], [address] [place], [place] [address]    Culture >=100,000 COLONIES/mL ENTEROCOCCUS FAECALIS (A)  Final   Report Status 06/17/2021 FINAL  Final   Organism ID, Bacteria ENTEROCOCCUS FAECALIS (A)  Final      Susceptibility   Enterococcus faecalis - MIC*    AMPICILLIN <=2 SENSITIVE Sensitive     NITROFURANTOIN <=16 SENSITIVE Sensitive     VANCOMYCIN 1 SENSITIVE Sensitive     * >=100,000 COLONIES/mL ENTEROCOCCUS FAECALIS  Blood culture (routine x 2)     Status: None   Collection Time: 06/14/21  6:42 PM   Specimen: BLOOD  Result Value Ref Range Status   Specimen Description BLOOD SITE NOT SPECIFIED  Final   Special Requests   Final    BOTTLES DRAWN AEROBIC AND ANAEROBIC Blood Culture adequate volume   Culture   Final    NO GROWTH 5 DAYS Performed at [facility], [place] [address] [place], [place] [address]    Report Status 06/19/2021 FINAL  Final  Surgical pcr screen     Status: None   Collection Time: 06/14/21  8:45 PM   Specimen: Nasal Mucosa; Nasal Swab  Result Value Ref Range Status   MRSA, PCR NEGATIVE NEGATIVE Final   Staphylococcus aureus NEGATIVE NEGATIVE Final    Comment: (NOTE) The Xpert SA Assay (FDA approved for NASAL specimens in patients 93 years of age and older), is one component of a comprehensive surveillance program. It is not intended to diagnose infection nor to guide or monitor treatment. Performed at [facility], [place] [address] [place], [place] [address]   Culture, blood (routine x 2)     Status: None (Preliminary result)   Collection Time: 06/16/21  5:09 PM   Specimen: BLOOD  Result Value Ref Range Status   Specimen Description BLOOD BLOOD RIGHT FOREARM  Final   Special Requests   Final    BOTTLES DRAWN AEROBIC AND ANAEROBIC Blood Culture adequate volume   Culture   Final    NO GROWTH 3 DAYS Performed at [facility], [place] [address], [place], [place] [address]    Report Status PENDING  Incomplete  Culture, blood  (routine x 2)     Status: None (Preliminary result)   Collection Time: 06/16/21  5:10 PM   Specimen: BLOOD  Result Value Ref Range Status   Specimen Description BLOOD BLOOD RIGHT FOREARM  Final   Special Requests   Final    BOTTLES DRAWN AEROBIC AND ANAEROBIC Blood Culture adequate volume   Culture   Final    NO GROWTH 3 DAYS Performed at [facility], [place] [address], [place],  [address]    Report Status PENDING  Incomplete    Radiology Studies: No results found.    Maryl Blalock T. [place]  If 7PM-7AM, please contact night-coverage [email or web address] 06/19/2021, 2:04 PM

## 2021-06-20 DIAGNOSIS — T83511S Infection and inflammatory reaction due to indwelling urethral catheter, sequela: Secondary | ICD-10-CM

## 2021-06-20 DIAGNOSIS — G301 Alzheimer's disease with late onset: Secondary | ICD-10-CM

## 2021-06-20 DIAGNOSIS — Z66 Do not resuscitate: Secondary | ICD-10-CM | POA: Diagnosis present

## 2021-06-20 DIAGNOSIS — N1832 Chronic kidney disease, stage 3b: Secondary | ICD-10-CM

## 2021-06-20 DIAGNOSIS — I10 Essential (primary) hypertension: Secondary | ICD-10-CM

## 2021-06-20 DIAGNOSIS — N179 Acute kidney failure, unspecified: Secondary | ICD-10-CM

## 2021-06-20 DIAGNOSIS — R1310 Dysphagia, unspecified: Secondary | ICD-10-CM

## 2021-06-20 DIAGNOSIS — R5381 Other malaise: Secondary | ICD-10-CM

## 2021-06-20 DIAGNOSIS — F02C18 Dementia in other diseases classified elsewhere, severe, with other behavioral disturbance: Secondary | ICD-10-CM

## 2021-06-20 LAB — RESP PANEL BY RT-PCR (FLU A&B, COVID) ARPGX2
Influenza A by PCR: NEGATIVE
Influenza B by PCR: NEGATIVE
SARS Coronavirus 2 by RT PCR: NEGATIVE

## 2021-06-20 LAB — GLUCOSE, CAPILLARY: Glucose-Capillary: 81 mg/dL (ref 70–99)

## 2021-06-20 MED ORDER — SENNOSIDES-DOCUSATE SODIUM 8.6-50 MG PO TABS
1.0000 | ORAL_TABLET | Freq: Two times a day (BID) | ORAL | Status: DC | PRN
  Administered 2021-06-20: 1 via ORAL
  Filled 2021-06-20: qty 1

## 2021-06-20 MED ORDER — FLUCONAZOLE 150 MG PO TABS
150.0000 mg | ORAL_TABLET | Freq: Once | ORAL | Status: AC
  Administered 2021-06-20: 150 mg via ORAL
  Filled 2021-06-20: qty 1

## 2021-06-20 MED ORDER — SENNOSIDES-DOCUSATE SODIUM 8.6-50 MG PO TABS: 1.0000 | ORAL_TABLET | Freq: Every evening | ORAL | Status: DC | PRN

## 2021-06-20 MED ORDER — OXYCODONE HCL 5 MG PO TABS: 2.5000 mg | ORAL_TABLET | Freq: Three times a day (TID) | ORAL | 0 refills | Status: DC | PRN

## 2021-06-20 MED ORDER — ACETAMINOPHEN 500 MG PO TABS: ORAL_TABLET | ORAL | 0 refills | Status: DC

## 2021-06-20 MED ORDER — ENOXAPARIN SODIUM 40 MG/0.4ML IJ SOSY
40.0000 mg | PREFILLED_SYRINGE | INTRAMUSCULAR | 0 refills | Status: DC
Start: 2021-06-21 — End: 2021-07-08

## 2021-06-20 MED ORDER — NYSTATIN 100000 UNIT/ML MT SUSP: 5.0000 mL | Freq: Four times a day (QID) | OROMUCOSAL | 0 refills | Status: AC

## 2021-06-20 MED ORDER — AMOXICILLIN 500 MG PO CAPS: 500.0000 mg | ORAL_CAPSULE | Freq: Two times a day (BID) | ORAL | 0 refills | Status: AC

## 2021-06-20 MED ORDER — ENSURE ENLIVE PO LIQD: 237.0000 mL | Freq: Three times a day (TID) | ORAL | 12 refills | Status: AC

## 2021-06-20 MED ORDER — MELATONIN 3 MG PO TABS: 3.0000 mg | ORAL_TABLET | Freq: Every day | ORAL | 0 refills | Status: DC

## 2021-06-20 MED ORDER — MIDODRINE HCL 10 MG PO TABS: 10.0000 mg | ORAL_TABLET | Freq: Three times a day (TID) | ORAL | Status: DC

## 2021-06-20 NOTE — Discharge Summary (Addendum)
Physician Discharge Summary  [name] XTA:[id number] DOB: 26-Jun-1925 DOA: 06/14/2021  PCP: [name], NP  Admit date: 06/14/2021 Discharge date: 06/20/2021 Admitted From: Home Disposition: SNF Recommendations for Outpatient Follow-up:  Follow ups as below. Please obtain CBC/BMP/Mag at follow up Recommend palliative follow-up at SNF Please follow up on the following pending results: None  Discharge Condition: Stable CODE STATUS: DNR/DNI  Follow-up Information     Willaim Sheng, MD. Schedule an appointment as soon as possible for a visit in 2 week(s).   Specialty: Orthopedic Surgery Contact information: [address] [place] [address] [phone]                Hospital Course: 88 year old M with PMH of dementia, HTN not on meds, BPH/TURP, chronic urinary tension with indwelling Foley, recurrent UTIs, OSA on CPAP and CKD-3 brought to ED with left hip and left shoulder pain after fall the night prior when he got up unassisted, and found to have left femoral neck fracture.  Orthopedic surgery consulted.  He was started on IV ceftriaxone.   Patient underwent bipolar hip hemiarthroplasty by Dr. Zachery Dakins on 06/15/2021.  Hospital course complicated by encephalopathy and hypotension.  Work-up unrevealing.  Hypotension seems to be resolved with p.o. midodrine and IV fluid.  He remained stable off IV fluid.  Encephalopathy improved but had episode of delirium.   In regards to UTI, urine culture grew pansensitive Enterococcus faecalis.  Transitioned to IV Unasyn and then p.o. amoxicillin.  Discharged on p.o. amoxicillin for 3 more days.   Palliative medicine consulted.  CODE STATUS changed to DNR/DNI.  Therapy recommended SNF.  See individual problem list below for more on hospital course.  Discharge Diagnoses:  Accidental fall at home Left hip fracture/closed left femoral neck fracture -Status post ORIF as above. -WBAT with posterior hip  precautions x6 weeks -Pain control with scheduled Tylenol and as needed oxycodone -Subcu Lovenox for VTE prophylaxis. -Bowel regimen.   Catheter associated Enterococcus faecalis UTI-chronic Foley due to chronic urinary tension/BPH -Followed by urology at [facility] where Foley was placed on 06/07/2021. -Urine culture on 12/27 grew Citrobacter and Serratia, and started on 10 days course of Bactrim on 12/30 -Urine culture here with pansensitive Enterococcus faecalis.  Blood cultures NGTD. -1/3 IV CTX>> 1/5 IV Unasyn>>> 1/7 p.o. amoxicillin>> 1/12   Hypotension/orthostatic hypotension: Work-up unrevealing.  Likely dehydration from poor p.o. intake.  Hypotension seems to have resolved. -Continue midodrine 10 mg 3 times daily -Encourage oral intake   Dementia with behavioral disturbance-increased confusion and hallucination lately.  CTH without acute finding.  Infectious work-up as above.  ABG normal. -Treat UTI as above -Reorientation and delirium precautions   AKI on CKD-3A: Likely due to poor p.o. intake.  AKI resolved.            Recent Labs    09/21/20 1343 02/21/21 1140 06/14/21 1027 06/15/21 0235 06/16/21 0257 06/16/21 1709 06/17/21 0153 06/18/21 0256 06/19/21 0357  BUN 21 19 14 12 16 17 17 14 16   CREATININE 1.18* 1.14 1.51* 1.41* 1.27* 1.32* 1.19 0.99 1.07  -Monitor intermittently.   OSA on CPAP: Patient's wife concerned about CPAP worsening dementia and anemia.  Reassured about this and recommended continuing CPAP as long as he tolerates.  -Nightly CPAP as tolerated   Normocytic anemia: Slight drop in Hgb likely dilutional from IV fluid.  Anemia panel consistent with ACD.            Recent Labs  09/21/20 1343 02/21/21 1140 06/14/21 1027 06/15/21 0235 06/16/21 0257 06/16/21 1709 06/17/21 0153 06/18/21 0256 06/19/21 0357  HGB 12.2* 12.5* 13.5 11.3* 11.5* 11.6* 10.5* 9.6* 9.8*  -Recheck CBC at follow-up   Thrombocytopenia: Resolved.          Recent Labs  Lab  06/14/21 1027 06/15/21 0235 06/16/21 0257 06/16/21 1709 06/17/21 0153 06/18/21 0256 06/19/21 0357  PLT 163 125* 111* 128* 126* 141* 154    Hyponatremia: Resolved.   Debility/physical deconditioning -PT/OT recommended SNF.   Dysphagia: -Continue dysphagia 2 diet per SLP -Received Diflucan 150 mg x 1 -Nystatin suspension for 5 days   Goal of care counseling:  -Appreciate help by palliative care-now DNR/DNI. -Recommend palliative follow-up at SNF.   Severe malnutrition: Low BMI with significant muscle mass and subcu fat loss and poor p.o. intake. Body mass index is 20.75 kg/m. Nutrition Problem: Severe Malnutrition Etiology: chronic illness (dementia) Signs/Symptoms: severe fat depletion, severe muscle depletion Interventions: Ensure Enlive (each supplement provides 350kcal and 20 grams of protein), MVI, Refer to RD note for recommendations     Discharge Exam: Vitals:   06/19/21 1953 06/19/21 2317 06/20/21 0500 06/20/21 0758  BP: (!) 113/45  (!) 126/56 99/61  Pulse: (!) 55  62 61  Temp: 98 F (36.7 C)   98.8 F (37.1 C)  Resp: 18  18 18   Height:      Weight:      SpO2: 100% 96%  98%  TempSrc: Oral   Axillary  BMI (Calculated):         GENERAL: Frail looking elderly male.  Nontoxic. HEENT: MMM.  Hearing grossly intact.  Impaired vision. NECK: Supple.  No apparent JVD.  RESP: 98% on RA.  No IWOB.  Fair aeration bilaterally. CVS:  RRR. Heart sounds normal.  ABD/GI/GU: Bowel sounds present. Soft. Non tender.  MSK/EXT:  Moves extremities.  Significant muscle mass and subcu fat loss. SKIN: Dressing over surgical site DCI. NEURO: Awake.  Lying with eyes closed.  Oriented to self.  Follows commands.  No apparent focal neurodeficit. PSYCH: Calm. Normal affect.   Discharge Instructions  Discharge Instructions     Diet general   Complete by: As directed    Dysphagia-2 diet   Discharge wound care:   Complete by: As directed    Keep dressing on until follow up  with surgeon   Increase activity slowly   Complete by: As directed       Allergies as of 06/20/2021   No Known Allergies      Medication List     STOP taking these medications    aspirin EC 81 MG tablet   cetirizine 10 MG tablet Commonly known as: ZYRTEC   loratadine 10 MG tablet Commonly known as: CLARITIN   sulfamethoxazole-trimethoprim 800-160 MG tablet Commonly known as: BACTRIM DS       TAKE these medications    acetaminophen 500 MG tablet Commonly known as: TYLENOL Take 2 tablets (1,000 mg total) by mouth every 8 (eight) hours for 5 days, THEN 2 tablets (1,000 mg total) every 8 (eight) hours as needed for up to 25 days. Start taking on: June 20, 2021 What changed: See the new instructions.   amoxicillin 500 MG capsule Commonly known as: AMOXIL Take 1 capsule (500 mg total) by mouth every 12 (twelve) hours for 3 days.   brimonidine 0.2 % ophthalmic solution Commonly known as: ALPHAGAN Place 1 drop into the left eye Twice daily.   dorzolamide-timolol 22.3-6.8 MG/ML ophthalmic  solution Commonly known as: COSOPT Place 1 drop into the left eye 2 (two) times daily.   enoxaparin 40 MG/0.4ML injection Commonly known as: LOVENOX Inject 0.4 mLs (40 mg total) into the skin daily for 24 days. Start taking on: June 21, 2021   feeding supplement Liqd Take 237 mLs by mouth 3 (three) times daily between meals.   melatonin 3 MG Tabs tablet Take 1 tablet (3 mg total) by mouth at bedtime.   midodrine 10 MG tablet Commonly known as: PROAMATINE Take 1 tablet (10 mg total) by mouth 3 (three) times daily with meals.   multivitamin tablet Take 1 tablet by mouth daily.   oxyCODONE 5 MG immediate release tablet Commonly known as: Oxy IR/ROXICODONE Take 0.5-1 tablets (2.5-5 mg total) by mouth every 8 (eight) hours as needed for up to 3 days for severe pain or breakthrough pain.   senna-docusate 8.6-50 MG tablet Commonly known as: Senokot-S Take 1 tablet by  mouth at bedtime as needed for mild constipation.   UNABLE TO FIND Med Name: CPAP machine, minimal of 4 hours per night               Discharge Care Instructions  (From admission, onward)           Start     Ordered   06/20/21 0000  Discharge wound care:       Comments: Keep dressing on until follow up with surgeon   06/20/21 1224            Consultations: Orthopedic surgery Palliative medicine  Procedures/Studies: 06/15/2021-bipolar hemiarthroplasty of left femoral neck fracture   DG Chest 1 View  Result Date: 06/14/2021 CLINICAL DATA:  Fall.  Hip fracture EXAM: CHEST  1 VIEW COMPARISON:  09/15/2020 FINDINGS: Cardiac and mediastinal contours normal. Atherosclerotic aortic arch. Normal vascularity Lungs are clear without infiltrate or effusion. Mild elevation left hemidiaphragm. IMPRESSION: No active disease. Electronically Signed   By: Franchot Gallo M.D.   On: 06/14/2021 11:56   CT Head Wo Contrast  Result Date: 06/14/2021 CLINICAL DATA:  Fall, head injury.  Confusion. EXAM: CT HEAD WITHOUT CONTRAST TECHNIQUE: Contiguous axial images were obtained from the base of the skull through the vertex without intravenous contrast. COMPARISON:  CT head 09/14/2020 FINDINGS: Brain: Generalized atrophy. Moderate to extensive chronic white matter hypodensity unchanged. Negative for acute infarct, hemorrhage, mass Vascular: Negative for hyperdense vessel Skull: Negative Sinuses/Orbits: Small air-fluid level sphenoid sinus. Mild mucosal edema maxillary sinus bilaterally. Bilateral cataract surgery Other: None IMPRESSION: No acute abnormality. Atrophy and chronic microvascular ischemic change in the white matter. Electronically Signed   By: Franchot Gallo M.D.   On: 06/14/2021 10:51   DG Chest Port 1 View  Result Date: 06/16/2021 CLINICAL DATA:  Fall, hypotension EXAM: PORTABLE CHEST 1 VIEW COMPARISON:  06/14/2021 FINDINGS: Single frontal view of the chest demonstrates a stable cardiac  silhouette. Linear consolidation at the right lung base most consistent with subsegmental atelectasis. No airspace disease, effusion, or pneumothorax. Chronic elevation left hemidiaphragm. No acute bony abnormalities. IMPRESSION: 1. Right basilar subsegmental atelectasis. 2. Otherwise stable exam. Electronically Signed   By: Randa Ngo M.D.   On: 06/16/2021 16:54   DG Shoulder Left  Result Date: 06/14/2021 CLINICAL DATA:  Fall today EXAM: LEFT SHOULDER - 2+ VIEW COMPARISON:  None. FINDINGS: Normal alignment no fracture. Mild degenerative change in spurring AC joint. IMPRESSION: Negative. Electronically Signed   By: Franchot Gallo M.D.   On: 06/14/2021 11:54   DG HIP  OPERATIVE UNILAT W OR W/O PELVIS LEFT  Result Date: 06/15/2021 CLINICAL DATA:  88 year old male status post left hip fracture and replacement. EXAM: OPERATIVE choose 1 HIP (WITH PELVIS IF PERFORMED) 3 VIEWS TECHNIQUE: Postoperative radiographs, Portable AP supine view and cross-table lateral. COMPARISON:  06/14/2021. FINDINGS: Bipolar type left hip arthroplasty. Hardware components appear intact and normally aligned. No unexpected osseous changes. No new fracture identified. IMPRESSION: Left hip arthroplasty with no adverse features. Electronically Signed   By: Genevie Ann M.D.   On: 06/15/2021 11:51   DG Hip Unilat W or Wo Pelvis 2-3 Views Left  Result Date: 06/14/2021 CLINICAL DATA:  Fall today EXAM: DG HIP (WITH OR WITHOUT PELVIS) 2-3V LEFT COMPARISON:  None. FINDINGS: Fracture left femoral neck. Probable subcapital fracture with impaction and angulation. Left hip joint normal. No other fracture. IMPRESSION: Impacted subcapital left femoral neck fracture Electronically Signed   By: Franchot Gallo M.D.   On: 06/14/2021 11:55       The results of significant diagnostics from this hospitalization (including imaging, microbiology, ancillary and laboratory) are listed below for reference.     Microbiology: Recent Results (from the past  240 hour(s))  Resp Panel by RT-PCR (Flu A&B, Covid) Peripheral     Status: None   Collection Time: 06/14/21 10:27 AM   Specimen: Peripheral; Nasopharyngeal(NP) swabs in vial transport medium  Result Value Ref Range Status   SARS Coronavirus 2 by RT PCR NEGATIVE NEGATIVE Final    Comment: (NOTE) SARS-CoV-2 target nucleic acids are NOT DETECTED.  The SARS-CoV-2 RNA is generally detectable in upper respiratory specimens during the acute phase of infection. The lowest concentration of SARS-CoV-2 viral copies this assay can detect is 138 copies/mL. A negative result does not preclude SARS-Cov-2 infection and should not be used as the sole basis for treatment or other patient management decisions. A negative result may occur with  improper specimen collection/handling, submission of specimen other than nasopharyngeal swab, presence of viral mutation(s) within the areas targeted by this assay, and inadequate number of viral copies(<138 copies/mL). A negative result must be combined with clinical observations, patient history, and epidemiological information. The expected result is Negative.  Fact Sheet for Patients:  EntrepreneurPulse.com.au  Fact Sheet for Healthcare Providers:  IncredibleEmployment.be  This test is no t yet approved or cleared by the [place] FDA and  has been authorized for detection and/or diagnosis of SARS-CoV-2 by FDA under an Emergency Use Authorization (EUA). This EUA will remain  in effect (meaning this test can be used) for the duration of the COVID-19 declaration under Section 564(b)(1) of the Act, 21 U.S.C.section 360bbb-3(b)(1), unless the authorization is terminated  or revoked sooner.       Influenza A by PCR NEGATIVE NEGATIVE Final   Influenza B by PCR NEGATIVE NEGATIVE Final    Comment: (NOTE) The Xpert Xpress SARS-CoV-2/FLU/RSV plus assay is intended as an aid in the diagnosis of influenza from  Nasopharyngeal swab specimens and should not be used as a sole basis for treatment. Nasal washings and aspirates are unacceptable for Xpert Xpress SARS-CoV-2/FLU/RSV testing.  Fact Sheet for Patients: EntrepreneurPulse.com.au  Fact Sheet for Healthcare Providers: IncredibleEmployment.be  This test is not yet approved or cleared by the [place] FDA and has been authorized for detection and/or diagnosis of SARS-CoV-2 by FDA under an Emergency Use Authorization (EUA). This EUA will remain in effect (meaning this test can be used) for the duration of the COVID-19 declaration under Section 564(b)(1) of the Act, 21 U.S.C.  section 360bbb-3(b)(1), unless the authorization is terminated or revoked.  Performed at [facility], [address] [place], [place] [address]   Blood culture (routine x 2)     Status: None   Collection Time: 06/14/21 10:28 AM   Specimen: BLOOD  Result Value Ref Range Status   Specimen Description   Final    BLOOD Blood Culture adequate volume Performed at [facility], [address] [place], [place] [address]    Special Requests   Final    BOTTLES DRAWN AEROBIC AND ANAEROBIC LEFT ANTECUBITAL Performed at [facility], [address] [place], [place] [address]    Culture   Final    NO GROWTH 5 DAYS Performed at [facility], [place] [address], [place], [place] [address]    Report Status 06/19/2021 FINAL  Final  Urine Culture     Status: Abnormal   Collection Time: 06/14/21 12:20 PM   Specimen: Urine, Clean Catch  Result Value Ref Range Status   Specimen Description   Final    URINE, CLEAN CATCH Performed at [facility], [address] [place], [place] [address]    Special Requests   Final    NONE Performed at [facility], [address] [place], [place] [address]    Culture >=100,000 COLONIES/mL ENTEROCOCCUS FAECALIS (A)  Final   Report Status  06/17/2021 FINAL  Final   Organism ID, Bacteria ENTEROCOCCUS FAECALIS (A)  Final      Susceptibility   Enterococcus faecalis - MIC*    AMPICILLIN <=2 SENSITIVE Sensitive     NITROFURANTOIN <=16 SENSITIVE Sensitive     VANCOMYCIN 1 SENSITIVE Sensitive     * >=100,000 COLONIES/mL ENTEROCOCCUS FAECALIS  Blood culture (routine x 2)     Status: None   Collection Time: 06/14/21  6:42 PM   Specimen: BLOOD  Result Value Ref Range Status   Specimen Description BLOOD SITE NOT SPECIFIED  Final   Special Requests   Final    BOTTLES DRAWN AEROBIC AND ANAEROBIC Blood Culture adequate volume   Culture   Final    NO GROWTH 5 DAYS Performed at [facility], [place] [address] [place], [place] [address]    Report Status 06/19/2021 FINAL  Final  Surgical pcr screen     Status: None   Collection Time: 06/14/21  8:45 PM   Specimen: Nasal Mucosa; Nasal Swab  Result Value Ref Range Status   MRSA, PCR NEGATIVE NEGATIVE Final   Staphylococcus aureus NEGATIVE NEGATIVE Final    Comment: (NOTE) The Xpert SA Assay (FDA approved for NASAL specimens in patients 63 years of age and older), is one component of a comprehensive surveillance program. It is not intended to diagnose infection nor to guide or monitor treatment. Performed at [facility], [place] [address], [place], [place] [address]   Culture, blood (routine x 2)     Status: None (Preliminary result)   Collection Time: 06/16/21  5:09 PM   Specimen: BLOOD  Result Value Ref Range Status   Specimen Description BLOOD BLOOD RIGHT FOREARM  Final   Special Requests   Final    BOTTLES DRAWN AEROBIC AND ANAEROBIC Blood Culture adequate volume   Culture   Final    NO GROWTH 4 DAYS Performed at [facility], [place] [address] [place], [place] [address]    Report Status PENDING  Incomplete  Culture, blood (  routine x 2)     Status: None (Preliminary result)   Collection Time: 06/16/21  5:10 PM   Specimen: BLOOD  Result Value Ref Range Status    Specimen Description BLOOD BLOOD RIGHT FOREARM  Final   Special Requests   Final    BOTTLES DRAWN AEROBIC AND ANAEROBIC Blood Culture adequate volume   Culture   Final    NO GROWTH 4 DAYS Performed at [facility], [address] [place], [place] [address]    Report Status PENDING  Incomplete     Labs:  CBC: Recent Labs  Lab 06/14/21 1027 06/15/21 0235 06/16/21 0257 06/16/21 1709 06/17/21 0153 06/18/21 0256 06/19/21 0357  WBC 7.1   < > 9.2 8.0 7.2 6.9 6.9  NEUTROABS 5.8  --   --   --   --   --   --   HGB 13.5   < > 11.5* 11.6* 10.5* 9.6* 9.8*  HCT 41.1   < > 35.7* 36.0* 33.3* 29.7* 30.3*  MCV 95.4   < > 96.0 97.8 98.5 97.7 98.7  PLT 163   < > 111* 128* 126* 141* 154   < > = values in this interval not displayed.   BMP &GFR Recent Labs  Lab 06/16/21 0257 06/16/21 1709 06/17/21 0153 06/18/21 0256 06/19/21 0357  NA 136 135 135 137 140  K 4.5 4.2 3.9 4.1 4.2  CL 104 103 103 107 105  CO2 25 25 25 27 27   GLUCOSE 112* 107* 91 96 90  BUN 16 17 17 14 16   CREATININE 1.27* 1.32* 1.19 0.99 1.07  CALCIUM 8.7* 8.6* 8.4* 8.7* 8.8*  MG 2.0  --  2.0 2.2 2.1  PHOS 3.4  --  3.0 2.6 3.1   Estimated Creatinine Clearance: 36.7 mL/min (by C-G formula based on SCr of 1.07 mg/dL). Liver & Pancreas: Recent Labs  Lab 06/14/21 1027 06/16/21 0257 06/16/21 1709 06/17/21 0153 06/18/21 0256 06/19/21 0357  AST 27  --  23  --   --   --   ALT 17  --  11  --   --   --   ALKPHOS 177*  --  119  --   --   --   BILITOT 0.7  --  0.9  --   --   --   PROT 7.7  --  6.7  --   --   --   ALBUMIN 3.8 2.9* 3.3* 2.8* 2.4* 2.5*   No results for input(s): LIPASE, AMYLASE in the last 168 hours. Recent Labs  Lab 06/17/21 0153  AMMONIA 21   Diabetic: No results for input(s): HGBA1C in the last 72 hours. Recent Labs  Lab 06/16/21 1617 06/17/21 0856 06/18/21 1338 06/18/21 2056 06/20/21 0829  GLUCAP 111* 108* 109* 143* 81   Cardiac Enzymes: Recent Labs  Lab 06/17/21 0153   CKTOTAL 301   No results for input(s): PROBNP in the last 8760 hours. Coagulation Profile: No results for input(s): INR, PROTIME in the last 168 hours. Thyroid Function Tests: No results for input(s): TSH, T4TOTAL, FREET4, T3FREE, THYROIDAB in the last 72 hours. Lipid Profile: No results for input(s): CHOL, HDL, LDLCALC, TRIG, CHOLHDL, LDLDIRECT in the last 72 hours. Anemia Panel: No results for input(s): VITAMINB12, FOLATE, FERRITIN, TIBC, IRON, RETICCTPCT in the last 72 hours. Urine analysis:    Component Value Date/Time   COLORURINE YELLOW 06/14/2021 1220   APPEARANCEUR CLEAR 06/14/2021 1220   LABSPEC 1.015 06/14/2021 1220   PHURINE  7.5 06/14/2021 1220   GLUCOSEU NEGATIVE 06/14/2021 1220   GLUCOSEU NEGATIVE 01/04/2017 1025   HGBUR NEGATIVE 06/14/2021 1220   [place] 06/14/2021 1220   [place] 06/14/2021 1220   PROTEINUR NEGATIVE 06/14/2021 1220   UROBILINOGEN 0.2 01/04/2017 1025   NITRITE NEGATIVE 06/14/2021 1220   LEUKOCYTESUR SMALL (A) 06/14/2021 1220   Sepsis Labs: Invalid input(s): PROCALCITONIN, LACTICIDVEN   Time coordinating discharge: 45 minutes  SIGNED:  Mercy Riding, MD  [facility] 06/20/2021, 12:25 PM

## 2021-06-20 NOTE — TOC Progression Note (Addendum)
Transition of Care [facility]) - Progression Note    Patient Details  Name: [name] MRN: [id number] Date of Birth: May 12, 1926  [facility] CM/SW Contact  Joanne Chars, LCSW Phone Number: 06/20/2021, 10:12 AM  Clinical Narrative:  CSW reviewed bed offers with daughter Vincente Liberty and wife [name] (who was on speakerphone.)  Wife requesting CSW  check with following facilities:  [facility]: no response [facility]: makes bed offer [facility]: makes bed offer [facility]: no response  CSW updated wife and daughter, they chose [facility].  CSW confirmed with Perrin Smack that she can receive pt today.  Per Perrin Smack, semi private room, wife agrees to this.  Wife asks about family remaining overnight.  Per [facility], can visit into the evening, but not overnight--wife informed and agrees to this. MD informed, will order covid test.   PASSR is now back: [id number]  Auth submitted in [place].     Expected Discharge Plan: [place] Barriers to Discharge: Continued Medical Work up, SNF Pending bed offer  Expected Discharge Plan and Services Expected Discharge Plan: [place] In-house Referral: Clinical Social Work   Post Acute Care Choice: [place] Living arrangements for the past 2 months: Single Family Home                                       Social Determinants of Health (SDOH) Interventions    Readmission Risk Interventions No flowsheet data found.

## 2021-06-20 NOTE — Progress Notes (Signed)
° ° ° °  Subjective: Patient had a good night. Daughter at bedside. Mobilized to chair yesterday which daughter reports went well.  Patient sleeping comfortably this morning, no issues.  Objective:   VITALS:   Vitals:   06/19/21 1948 06/19/21 1953 06/19/21 2317 06/20/21 0500  BP: (!) 113/45 (!) 113/45  (!) 126/56  Pulse: (!) 52 (!) 55  62  Resp: $Remo'18 18  18  'pNqNL$ Temp: 98 F (36.7 C) 98 F (36.7 C)    TempSrc: Axillary Oral    SpO2: 100% 100% 96%   Weight:      Height:        Intact pulses distally Dorsiflexion/Plantar flexion intact Incision: dressing C/D/I Compartment soft Patient not answering questions regarding intact sensation bilateral lower extremity  Lab Results  Component Value Date   WBC 6.9 06/19/2021   HGB 9.8 (L) 06/19/2021   HCT 30.3 (L) 06/19/2021   MCV 98.7 06/19/2021   PLT 154 06/19/2021   BMET    Component Value Date/Time   NA 140 06/19/2021 0357   NA 138 04/05/2018 0000   K 4.2 06/19/2021 0357   CL 105 06/19/2021 0357   CO2 27 06/19/2021 0357   GLUCOSE 90 06/19/2021 0357   GLUCOSE 91 05/11/2006 1035   BUN 16 06/19/2021 0357   BUN 14 04/05/2018 0000   CREATININE 1.07 06/19/2021 0357   CREATININE 1.14 02/21/2021 1140   CALCIUM 8.8 (L) 06/19/2021 0357   EGFR 60 02/21/2021 1140   GFRNONAA >60 06/19/2021 0357   GFRNONAA 53 (L) 09/21/2020 1343     Assessment/Plan: 5 Days Post-Op   Principal Problem:   Fracture of femoral neck, left, closed ([place]) Active Problems:   Essential hypertension   Stage 3 chronic kidney disease (HCC)   UTI (urinary tract infection)   Urine retention   Dementia (HCC)   Acute encephalopathy   OSA on CPAP   Protein-calorie malnutrition, severe Left hip hemiarthroplasty for femoral neck fracture on 06/15/2021  Post op recs: WB: WBAT with posterior hip precautions x6 weeks Abx: will continue abx post op that he his getting for his UTI, currently on Unasyn Imaging: PACU xrays Dressing: Aquacel dressing to be kept intact  until follow-up DVT prophylaxis: lovenox starting POD1 x4 weeks Follow up: 2 weeks after surgery for a wound check with Dr. Zachery Dakins at [facility].  Address: [address] [place], [place], [place] [address]  Office Phone: [phone]    Willaim Sheng 06/20/2021, 7:07 AM   Charlies Constable, MD  Contact information:   [phone] 7am-5pm epic message Dr. Zachery Dakins, or call office for patient follow up: [phone] After hours and holidays please check [email or web address] for group call information for [facility]

## 2021-06-20 NOTE — Progress Notes (Signed)
Discharge summary packet/pertinent documents provided to PTAR, Pt remains alert to self only in no acute distress. Pt d/c to [facility] as ordered.

## 2021-06-20 NOTE — Progress Notes (Signed)
Physical Therapy Treatment Patient Details Name: [name] MRN: [id number] DOB: 06/21/1925 Today's Date: 06/20/2021   History of Present Illness Pt is a 88 y/o M presentign to ED on 1/3 after a fall at home, reporting L shoulder and L hip pain, found to have L femoral neck fracture, s/p hemiarthroplasty on 1/4, Posterior Hip Precautions, WBAT; postural hypotension during initial PT and OT evals. PMH includes dementia, HTN, CKD, and OSA.    PT Comments    Continuing work on functional mobility and activity tolerance;  2 person assist to get up to EOB and stand pivot OOB to recliner placed on pt's R; serial BPs as follows:   Supine; BP 95/56, MAP 65, HR 52 Sitting;  BP 82/62, MAP 69, HR 76; feet down, chair upright Near Supine in recliner; BP 98/60, MAP 72; HR 60 Sitting upright in recliner, feet down; BP 82/52, MAP 59, HR 63 Reclined in recliner, feet up; BP 96/51, MAP 65, HR 57  Discussed pt's low BPs with Dr. Cyndia Skeeters, who attributes most of this to poor oral intake in setting of dementia; will encourage PO intake; Pt's family is providing excellent support    Recommendations for follow up therapy are one component of a multi-disciplinary discharge planning process, led by the attending physician.  Recommendations may be updated based on patient status, additional functional criteria and insurance authorization.  Follow Up Recommendations  Skilled nursing-short term rehab (<3 hours/day)     Assistance Recommended at Discharge Frequent or constant Supervision/Assistance  Patient can return home with the following Two people to help with walking and/or transfers;A lot of help with bathing/dressing/bathroom;Assist for transportation   Equipment Recommendations  Hospital bed;Wheelchair (measurements PT);Wheelchair cushion (measurements PT);BSC/3in1    Recommendations for Other Services       Precautions / Restrictions Precautions Precautions: Posterior Hip;Fall Precaution  Booklet Issued: Yes (comment) Precaution Comments: Discussed and demonstrated Post Hip Precautions with pt's daughter Restrictions Weight Bearing Restrictions: No LLE Weight Bearing: Weight bearing as tolerated     Mobility  Bed Mobility Overal bed mobility: Needs Assistance Bed Mobility: Supine to Sit     Supine to sit: Total assist;+2 for physical assistance     General bed mobility comments: Used bed pad to cradle and support hhips and thighs while moving LEs to EOB and tehn to clear EOB; second person assist to provide support at back and elevate trunk to sitting    Transfers Overall transfer level: Needs assistance Equipment used: 2 person hand held assist (and bilateral suport at gait belt) Transfers: Bed to chair/wheelchair/BSC Sit to Stand: +2 physical assistance;Max assist Stand pivot transfers: +2 physical assistance;Max assist         General transfer comment: Close guard, including watching for posterior precautions, and R knee blocked for stabiltiy during stand pivot transfer bed to recliner placed on pt's R side; noting more muscle activation with push to stand than previous session    Ambulation/Gait               General Gait Details: Unable to stand long enough to initiate gait training   Stairs             Wheelchair Mobility    Modified Rankin (Stroke Patients Only)       Balance     Sitting balance-Leahy Scale: Poor       Standing balance-Leahy Scale: Zero  Cognition Arousal/Alertness: Lethargic (Sleepy, and eyes closed much of session, but would open eyes when asked to) Behavior During Therapy: WFL for tasks assessed/performed Overall Cognitive Status: History of cognitive impairments - at baseline                                 General Comments: Pt initiated singing mostly [place] songs during session when asked if he had a song in his heart        Exercises       General Comments General comments (skin integrity, edema, etc.): Discussed pt's low BPs with Dr. Cyndia Skeeters, who attributes most of this to poor oral intake in setting of dementia; will encourage PO intake; Pt's family is providing excellent support      Pertinent Vitals/Pain Pain Assessment: Faces Faces Pain Scale: Hurts even more Pain Location: Grimace with LLE movement Pain Descriptors / Indicators: Grimacing;Discomfort Pain Intervention(s): Monitored during session;Premedicated before session;Repositioned    Home Living                          Prior Function            PT Goals (current goals can now be found in the care plan section) Acute Rehab PT Goals Patient Stated Goal: Did not state, but agreeable to getting OOB PT Goal Formulation: With patient/family Time For Goal Achievement: 06/30/21 Progress towards PT goals: Progressing toward goals (slowly)    Frequency    Min 2X/week      PT Plan Current plan remains appropriate    Co-evaluation              AM-PAC PT "6 Clicks" Mobility   Outcome Measure  Help needed turning from your back to your side while in a flat bed without using bedrails?: Total Help needed moving from lying on your back to sitting on the side of a flat bed without using bedrails?: Total Help needed moving to and from a bed to a chair (including a wheelchair)?: Total Help needed standing up from a chair using your arms (e.g., wheelchair or bedside chair)?: Total Help needed to walk in hospital room?: Total Help needed climbing 3-5 steps with a railing? : Total 6 Click Score: 6    End of Session Equipment Utilized During Treatment: Gait belt Activity Tolerance: Patient limited by fatigue;Patient limited by lethargy Patient left: in chair;with call bell/phone within reach;with chair alarm set;with family/visitor present (Feet up and reclined in recliner) Nurse Communication: Mobility status;Precautions;Other (comment) (Pt's  family requesting foley be changed) PT Visit Diagnosis: Unsteadiness on feet (R26.81);Other abnormalities of gait and mobility (R26.89);History of falling (Z91.81);Muscle weakness (generalized) (M62.81);Pain Pain - Right/Left: Left Pain - part of body: Hip     Time: 0940-1005 PT Time Calculation (min) (ACUTE ONLY): 25 min  Charges:  $Therapeutic Activity: 23-37 mins                     Roney Marion, PT  Acute Rehabilitation Services Pager [phone] Office [phone]    Colletta [place] 06/20/2021, 12:01 PM

## 2021-06-20 NOTE — Progress Notes (Signed)
Report given to [facility] ,staff nurse all questions and concerns were fully answered.

## 2021-06-20 NOTE — TOC Transition Note (Signed)
Transition of Care [facility]) - CM/SW Discharge Note   Patient Details  Name: [name] MRN: [id number] Date of Birth: June 27, 1925  Transition of Care [facility]) CM/SW Contact:  Joanne Chars, LCSW Phone Number: 06/20/2021, 2:20 PM   Clinical Narrative:   Pt discharging to [facility].  RN call report to [phone].      Final next level of care: Skilled Nursing Facility Barriers to Discharge: Barriers Resolved   Patient Goals and CMS Choice   CMS Medicare.gov Compare Post Acute Care list provided to:: Patient Represenative (must comment) Choice offered to / list presented to : Adult Children  Discharge Placement              Patient chooses bed at:  [facility]) Patient to be transferred to facility by: [place] Name of family member notified: daughter ADRIENNE in room Patient and family notified of of transfer: 06/20/21  Discharge Plan and Services In-house Referral: Clinical Social Work   Post Acute Care Choice: [place]                               Social Determinants of Health ([place]) Interventions     Readmission Risk Interventions No flowsheet data found.

## 2021-06-20 NOTE — Progress Notes (Signed)
Speech Language Pathology Treatment: Dysphagia  Patient Details Name: [name] MRN: [id number] DOB: 10-30-1925 Today's Date: 06/20/2021 Time: 4268-3419 SLP Time Calculation (min) (ACUTE ONLY): 20 min  Assessment / Plan / Recommendation Clinical Impression  Pt seen at bedside for skilled ST intervention targeting goal for diet tolerance. Pt's daughter was present, and reports minimal PO intake over the weekend. Pt continues to receive dys 2 solids and thin liquids. Safe swallow precautions posted at [facility]. Daughter was encouraged to take precaution sheet with pt upon DC. Unable to observe pt with PO trials at this time due to pt insufficiently arousable. Daughter reports he did not sleep last night. She also indicated he continues to grimace when swallowing. MD was contacted regarding the possibility of trying something to treat pharyngeal thrush (unable to visualize). SLP educated daughter re: safe swallow precautions, and use of yogurt to replace healthy bacteria that antibiotics remove. Pt slated for discharge today. Recommend follow up at next venue for education.   HPI HPI: 88yo male admitted 06/14/21 with hip fx after a fall at home. PMH: HTN, BPH s/p TURP, chronic urinary retention/UTI, dementia, OSA on CPAP, CKD3. Dtr reports consistent struggling with liquids and painful swallow at home. CXR - RLL subsegmental atelectasis. No airspace disease      SLP Plan  Continue with current plan of care      Recommendations for follow up therapy are one component of a multi-disciplinary discharge planning process, led by the attending physician.  Recommendations may be updated based on patient status, additional functional criteria and insurance authorization.    Recommendations  Diet recommendations: Dysphagia 2 (fine chop);Thin liquid Liquids provided via: Cup;Straw Medication Administration: Crushed with puree Supervision: Full supervision/cueing for compensatory strategies;Trained  caregiver to feed patient Compensations: Minimize environmental distractions;Slow rate;Small sips/bites Postural Changes and/or Swallow Maneuvers: Seated upright 90 degrees;Upright 30-60 min after meal                Oral Care Recommendations: Oral care BID Follow Up Recommendations: Skilled nursing-short term rehab (<3 hours/day) Assistance recommended at discharge: Frequent or constant Supervision/Assistance SLP Visit Diagnosis: Dysphagia, unspecified (R13.10) Plan: Continue with current plan of care          Dave Eber B. [name], [facility], [place] Speech Language Pathologist Office: [phone]  [name]  06/20/2021, 1:09 PM

## 2021-06-21 ENCOUNTER — Non-Acute Institutional Stay (SKILLED_NURSING_FACILITY): Payer: Medicare Other | Admitting: Adult Health

## 2021-06-21 ENCOUNTER — Encounter: Payer: Self-pay | Admitting: Adult Health

## 2021-06-21 DIAGNOSIS — T83511S Infection and inflammatory reaction due to indwelling urethral catheter, sequela: Secondary | ICD-10-CM | POA: Diagnosis not present

## 2021-06-21 DIAGNOSIS — I959 Hypotension, unspecified: Secondary | ICD-10-CM

## 2021-06-21 DIAGNOSIS — G301 Alzheimer's disease with late onset: Secondary | ICD-10-CM

## 2021-06-21 DIAGNOSIS — F02C18 Dementia in other diseases classified elsewhere, severe, with other behavioral disturbance: Secondary | ICD-10-CM

## 2021-06-21 DIAGNOSIS — S72002S Fracture of unspecified part of neck of left femur, sequela: Secondary | ICD-10-CM | POA: Diagnosis not present

## 2021-06-21 DIAGNOSIS — G4733 Obstructive sleep apnea (adult) (pediatric): Secondary | ICD-10-CM | POA: Diagnosis not present

## 2021-06-21 DIAGNOSIS — N390 Urinary tract infection, site not specified: Secondary | ICD-10-CM

## 2021-06-21 DIAGNOSIS — F5101 Primary insomnia: Secondary | ICD-10-CM

## 2021-06-21 LAB — CULTURE, BLOOD (ROUTINE X 2)
Culture: NO GROWTH
Culture: NO GROWTH
Special Requests: ADEQUATE
Special Requests: ADEQUATE
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
## 2021-06-22 ENCOUNTER — Encounter: Payer: Self-pay | Admitting: Internal Medicine

## 2021-06-22 ENCOUNTER — Non-Acute Institutional Stay (SKILLED_NURSING_FACILITY): Payer: Medicare Other | Admitting: Internal Medicine

## 2021-06-22 DIAGNOSIS — S72002S Fracture of unspecified part of neck of left femur, sequela: Secondary | ICD-10-CM

## 2021-06-22 DIAGNOSIS — B952 Enterococcus as the cause of diseases classified elsewhere: Secondary | ICD-10-CM

## 2021-06-22 DIAGNOSIS — N1832 Chronic kidney disease, stage 3b: Secondary | ICD-10-CM | POA: Diagnosis not present

## 2021-06-22 DIAGNOSIS — N390 Urinary tract infection, site not specified: Secondary | ICD-10-CM

## 2021-06-22 DIAGNOSIS — D62 Acute posthemorrhagic anemia: Secondary | ICD-10-CM | POA: Diagnosis not present

## 2021-06-22 DIAGNOSIS — R339 Retention of urine, unspecified: Secondary | ICD-10-CM

## 2021-06-22 DIAGNOSIS — E43 Unspecified severe protein-calorie malnutrition: Secondary | ICD-10-CM

## 2021-06-22 NOTE — Assessment & Plan Note (Signed)
Monitor for any dyscrasias on Lovenox prophylaxis at SNF.

## 2021-06-22 NOTE — Assessment & Plan Note (Addendum)
PT/OT as tolerated SNF.  Orthopedic follow-up as scheduled. Lovenox prophylaxis 20 mg daily x24 days.

## 2021-06-22 NOTE — Assessment & Plan Note (Signed)
Dr. Willaim Bane office will be notified to schedule the Foley catheter changed 07/01/2021.

## 2021-06-22 NOTE — Assessment & Plan Note (Addendum)
He remains afebrile.  Full course of amoxicillin will be completed at the SNF.

## 2021-06-22 NOTE — Patient Instructions (Signed)
See assessment and plan under each diagnosis in the problem list and acutely for this visit 

## 2021-06-22 NOTE — Assessment & Plan Note (Signed)
Med list reviewed; no change in medications unless there is progression of CKD.
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
## 2021-06-22 NOTE — Assessment & Plan Note (Signed)
Current albumin is 2.5 but total protein is normal at 6.7.  Marked interosseous wasting noted on exam.

## 2021-06-23 LAB — CBC AND DIFFERENTIAL
HCT: 32 — AB (ref 41–53)
Hemoglobin: 10 — AB (ref 13.5–17.5)
Neutrophils Absolute: 3.4
Platelets: 258 (ref 150–399)
WBC: 5.8

## 2021-06-23 LAB — COMPREHENSIVE METABOLIC PANEL
Calcium: 9.5 (ref 8.7–10.7)
GFR calc Af Amer: 78.54
GFR calc non Af Amer: 67.76

## 2021-06-23 LAB — BASIC METABOLIC PANEL
BUN: 17 (ref 4–21)
CO2: 27 — AB (ref 13–22)
Chloride: 107 (ref 99–108)
Creatinine: 1 (ref 0.6–1.3)
Glucose: 79
Potassium: 4.2 (ref 3.4–5.3)
Sodium: 146 (ref 137–147)

## 2021-06-23 LAB — CBC: RBC: 3.25 — AB (ref 3.87–5.11)

## 2021-06-29 ENCOUNTER — Non-Acute Institutional Stay (SKILLED_NURSING_FACILITY): Payer: Medicare Other | Admitting: Adult Health

## 2021-06-29 ENCOUNTER — Encounter: Payer: Self-pay | Admitting: Adult Health

## 2021-06-29 DIAGNOSIS — F02C18 Dementia in other diseases classified elsewhere, severe, with other behavioral disturbance: Secondary | ICD-10-CM

## 2021-06-29 DIAGNOSIS — S72002S Fracture of unspecified part of neck of left femur, sequela: Secondary | ICD-10-CM | POA: Diagnosis not present

## 2021-06-29 DIAGNOSIS — I959 Hypotension, unspecified: Secondary | ICD-10-CM | POA: Diagnosis not present

## 2021-06-29 DIAGNOSIS — G301 Alzheimer's disease with late onset: Secondary | ICD-10-CM

## 2021-06-29 DIAGNOSIS — Z978 Presence of other specified devices: Secondary | ICD-10-CM | POA: Diagnosis not present

## 2021-06-29 DIAGNOSIS — D62 Acute posthemorrhagic anemia: Secondary | ICD-10-CM | POA: Diagnosis not present
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
## 2021-07-07 ENCOUNTER — Encounter: Payer: Self-pay | Admitting: Adult Health

## 2021-07-07 ENCOUNTER — Non-Acute Institutional Stay (SKILLED_NURSING_FACILITY): Payer: Medicare Other | Admitting: Adult Health

## 2021-07-07 DIAGNOSIS — Z978 Presence of other specified devices: Secondary | ICD-10-CM | POA: Diagnosis not present

## 2021-07-07 DIAGNOSIS — B370 Candidal stomatitis: Secondary | ICD-10-CM

## 2021-07-07 DIAGNOSIS — R443 Hallucinations, unspecified: Secondary | ICD-10-CM | POA: Diagnosis not present
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
## 2021-07-08 ENCOUNTER — Encounter: Payer: Self-pay | Admitting: Adult Health

## 2021-07-08 ENCOUNTER — Non-Acute Institutional Stay (SKILLED_NURSING_FACILITY): Payer: Medicare Other | Admitting: Adult Health

## 2021-07-08 DIAGNOSIS — S72002S Fracture of unspecified part of neck of left femur, sequela: Secondary | ICD-10-CM | POA: Diagnosis not present

## 2021-07-08 DIAGNOSIS — I959 Hypotension, unspecified: Secondary | ICD-10-CM

## 2021-07-08 DIAGNOSIS — B370 Candidal stomatitis: Secondary | ICD-10-CM

## 2021-07-08 DIAGNOSIS — Z978 Presence of other specified devices: Secondary | ICD-10-CM

## 2021-07-08 DIAGNOSIS — F02C18 Dementia in other diseases classified elsewhere, severe, with other behavioral disturbance: Secondary | ICD-10-CM

## 2021-07-08 DIAGNOSIS — D62 Acute posthemorrhagic anemia: Secondary | ICD-10-CM

## 2021-07-08 DIAGNOSIS — G301 Alzheimer's disease with late onset: Secondary | ICD-10-CM

## 2021-07-08 MED ORDER — BRIMONIDINE TARTRATE 0.2 % OP SOLN: 1.0000 [drp] | Freq: Two times a day (BID) | OPHTHALMIC | 0 refills | Status: DC

## 2021-07-08 MED ORDER — ENOXAPARIN SODIUM 40 MG/0.4ML IJ SOSY: 40.0000 mg | PREFILLED_SYRINGE | INTRAMUSCULAR | 0 refills | Status: DC

## 2021-07-08 MED ORDER — MAGIC MOUTHWASH: 10.0000 mL | Freq: Four times a day (QID) | ORAL | 0 refills | Status: AC

## 2021-07-08 MED ORDER — DORZOLAMIDE HCL-TIMOLOL MAL 2-0.5 % OP SOLN
1.0000 [drp] | Freq: Two times a day (BID) | OPHTHALMIC | 0 refills | Status: DC
Start: 2021-07-08 — End: 2022-02-17

## 2021-07-08 MED ORDER — MIDODRINE HCL 10 MG PO TABS: 10.0000 mg | ORAL_TABLET | Freq: Three times a day (TID) | ORAL | 0 refills | Status: DC
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
## 2021-07-14 ENCOUNTER — Telehealth: Payer: Self-pay

## 2021-07-14 ENCOUNTER — Ambulatory Visit: Payer: Medicare Other | Admitting: Orthopedic Surgery

## 2021-07-14 ENCOUNTER — Other Ambulatory Visit: Payer: Self-pay

## 2021-07-14 ENCOUNTER — Encounter: Payer: Self-pay | Admitting: Orthopedic Surgery

## 2021-07-14 VITALS — BP 102/68 | HR 62 | Temp 97.3°F

## 2021-07-14 DIAGNOSIS — S72002S Fracture of unspecified part of neck of left femur, sequela: Secondary | ICD-10-CM

## 2021-07-14 DIAGNOSIS — E43 Unspecified severe protein-calorie malnutrition: Secondary | ICD-10-CM

## 2021-07-14 DIAGNOSIS — G301 Alzheimer's disease with late onset: Secondary | ICD-10-CM

## 2021-07-14 DIAGNOSIS — Z978 Presence of other specified devices: Secondary | ICD-10-CM | POA: Diagnosis not present

## 2021-07-14 DIAGNOSIS — F02C18 Dementia in other diseases classified elsewhere, severe, with other behavioral disturbance: Secondary | ICD-10-CM

## 2021-07-14 DIAGNOSIS — D62 Acute posthemorrhagic anemia: Secondary | ICD-10-CM | POA: Diagnosis not present

## 2021-07-14 DIAGNOSIS — I959 Hypotension, unspecified: Secondary | ICD-10-CM

## 2021-07-14 NOTE — Telephone Encounter (Signed)
Patient was in office today and after leaving the lab, his wife questioned if patient should resume 81 mg Asprin after last dose of lovenox which will be tomorrow.    Please advise

## 2021-07-14 NOTE — Patient Instructions (Addendum)
Follow up with urology for catheter changes, may discuss testing urine  Call palliative and discuss Hospice services  Recommend Boost shakes- add ice cream to them   Recommend shakes high in calories
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
## 2021-07-14 NOTE — Telephone Encounter (Signed)
Yes - that would be fine

## 2021-07-14 NOTE — Telephone Encounter (Signed)
Discussed response with patients wife who verbalized understanding.

## 2021-07-15 ENCOUNTER — Telehealth: Payer: Self-pay

## 2021-07-15 DIAGNOSIS — N401 Enlarged prostate with lower urinary tract symptoms: Secondary | ICD-10-CM | POA: Diagnosis not present

## 2021-07-15 DIAGNOSIS — R1310 Dysphagia, unspecified: Secondary | ICD-10-CM

## 2021-07-15 DIAGNOSIS — D631 Anemia in chronic kidney disease: Secondary | ICD-10-CM

## 2021-07-15 DIAGNOSIS — N1831 Chronic kidney disease, stage 3a: Secondary | ICD-10-CM

## 2021-07-15 DIAGNOSIS — G309 Alzheimer's disease, unspecified: Secondary | ICD-10-CM | POA: Diagnosis not present

## 2021-07-15 DIAGNOSIS — I129 Hypertensive chronic kidney disease with stage 1 through stage 4 chronic kidney disease, or unspecified chronic kidney disease: Secondary | ICD-10-CM

## 2021-07-15 DIAGNOSIS — R338 Other retention of urine: Secondary | ICD-10-CM | POA: Diagnosis not present

## 2021-07-15 DIAGNOSIS — S72002D Fracture of unspecified part of neck of left femur, subsequent encounter for closed fracture with routine healing: Secondary | ICD-10-CM | POA: Diagnosis not present

## 2021-07-15 LAB — BASIC METABOLIC PANEL
BUN: 25 mg/dL (ref 7–25)
CO2: 31 mmol/L (ref 20–32)
Calcium: 9.8 mg/dL (ref 8.6–10.3)
Chloride: 104 mmol/L (ref 98–110)
Creat: 1.18 mg/dL (ref 0.70–1.22)
Glucose, Bld: 76 mg/dL (ref 65–99)
Potassium: 4.3 mmol/L (ref 3.5–5.3)
Sodium: 141 mmol/L (ref 135–146)

## 2021-07-15 LAB — CBC WITH DIFFERENTIAL/PLATELET
Absolute Monocytes: 495 cells/uL (ref 200–950)
Basophils Absolute: 20 cells/uL (ref 0–200)
Basophils Relative: 0.5 %
Eosinophils Absolute: 109 cells/uL (ref 15–500)
Eosinophils Relative: 2.8 %
HCT: 35.8 % — ABNORMAL LOW (ref 38.5–50.0)
Hemoglobin: 11.7 g/dL — ABNORMAL LOW (ref 13.2–17.1)
Lymphs Abs: 1603 cells/uL (ref 850–3900)
MCH: 30.6 pg (ref 27.0–33.0)
MCHC: 32.7 g/dL (ref 32.0–36.0)
MCV: 93.7 fL (ref 80.0–100.0)
MPV: 10.9 fL (ref 7.5–12.5)
Monocytes Relative: 12.7 %
Neutro Abs: 1673 cells/uL (ref 1500–7800)
Neutrophils Relative %: 42.9 %
Platelets: 199 10*3/uL (ref 140–400)
RBC: 3.82 10*6/uL — ABNORMAL LOW (ref 4.20–5.80)
RDW: 12.4 % (ref 11.0–15.0)
Total Lymphocyte: 41.1 %
WBC: 3.9 10*3/uL (ref 3.8–10.8)

## 2021-07-15 NOTE — Telephone Encounter (Signed)
Okay to approve.

## 2021-07-15 NOTE — Telephone Encounter (Signed)
[name] w/ [facility] called requesting verbal order for PT 1 time week for 9 weeks and home health aid twice a week for 4 weeks to assist w/ bathing & dressing. Also medical social worker referral for community resources.

## 2021-07-18 NOTE — Telephone Encounter (Signed)
[name] w/ [facility] was giving approval for verbal orders per [name]. [name] states she will be faxing over paperwork to be signed by [name].

## 2021-07-19 ENCOUNTER — Telehealth: Payer: Self-pay | Admitting: *Deleted

## 2021-07-19 NOTE — Telephone Encounter (Signed)
Telena with [facility] called requesting Verbal orders for OT 1x5weeks.  Verbal orders given.

## 2021-07-21 ENCOUNTER — Telehealth: Payer: Self-pay | Admitting: *Deleted

## 2021-07-21 NOTE — Telephone Encounter (Signed)
Wells Guiles with [facility] called requesting verbal orders for [place].  Verbal orders given.

## 2021-07-21 NOTE — Telephone Encounter (Signed)
Anderson Malta PT with [facility] called requesting Verbal orders for PT 2x7weeks.   Verbal orders given.

## 2021-07-31 ENCOUNTER — Other Ambulatory Visit: Payer: Self-pay | Admitting: Adult Health

## 2021-07-31 DIAGNOSIS — I959 Hypotension, unspecified: Secondary | ICD-10-CM

## 2021-08-04 ENCOUNTER — Telehealth: Payer: Self-pay | Admitting: *Deleted

## 2021-08-04 DIAGNOSIS — E43 Unspecified severe protein-calorie malnutrition: Secondary | ICD-10-CM

## 2021-08-04 DIAGNOSIS — R5381 Other malaise: Secondary | ICD-10-CM

## 2021-08-04 NOTE — Telephone Encounter (Signed)
What is your advise on the Heel Protectors?

## 2021-08-04 NOTE — Telephone Encounter (Signed)
Plan to resume aspirin when Lovenox complete-- this is per ortho. I do not see anywhere where midodrine was stopped so would continue as prescribed.

## 2021-08-04 NOTE — Telephone Encounter (Signed)
Okay for home health to assist with getting heel protectors, we can order if needed or sign for them

## 2021-08-04 NOTE — Telephone Encounter (Signed)
[name] with [facility] called and stated that wife is requesting orders for:  1.)Full [place] Hospital Bed with Full Rail.  Stated that she has a Partial Electric/Partial Manual Hospital bed now which is too much on wife to have to use manual.   2.)Heel Protectors. Patient wife is concerned about patient developing Heel Pressure sores.   Wife stated that she uses [employer] and that is who the Hospital bed now is with.   Please Advise.

## 2021-08-04 NOTE — Telephone Encounter (Signed)
Patient wife called and stated that she just wanted to Confirm that patient is to continue on Midodrine and STAY off of Aspirin.   Please Advise.

## 2021-08-04 NOTE — Telephone Encounter (Signed)
Patient wife notified and agreed and stated that she will speak with Orthopaedic at Appointment next Friday regarding the Aspirin.

## 2021-08-04 NOTE — Telephone Encounter (Signed)
Order printed

## 2021-08-05 NOTE — Telephone Encounter (Signed)
LMOM for [name] with [facility] to return call.

## 2021-08-09 ENCOUNTER — Telehealth: Payer: Self-pay | Admitting: *Deleted

## 2021-08-09 NOTE — Telephone Encounter (Signed)
Order for Electric Bed faxed to [facility].

## 2021-08-09 NOTE — Telephone Encounter (Signed)
Aleena with [facility] called requesting to extend patients Home Health Aid for 4 more weeks.   Verbal order given.

## 2021-08-19 ENCOUNTER — Ambulatory Visit: Payer: Medicare Other | Admitting: Nurse Practitioner

## 2021-08-22 ENCOUNTER — Telehealth: Payer: Self-pay

## 2021-08-22 NOTE — Telephone Encounter (Signed)
Teliana from [facility] called in regards for verbal orders for OT for 1 a week for 9 weeks.  ? ?Verbal orders given.  ?

## 2021-08-25 ENCOUNTER — Encounter: Payer: Self-pay | Admitting: Nurse Practitioner

## 2021-08-26 ENCOUNTER — Ambulatory Visit (INDEPENDENT_AMBULATORY_CARE_PROVIDER_SITE_OTHER): Payer: Medicare Other | Admitting: Nurse Practitioner

## 2021-08-26 ENCOUNTER — Encounter: Payer: Self-pay | Admitting: Nurse Practitioner

## 2021-08-26 ENCOUNTER — Other Ambulatory Visit: Payer: Self-pay

## 2021-08-26 VITALS — BP 118/70 | HR 64 | Temp 97.5°F | Ht 67.0 in | Wt 106.0 lb

## 2021-08-26 DIAGNOSIS — N1832 Chronic kidney disease, stage 3b: Secondary | ICD-10-CM

## 2021-08-26 DIAGNOSIS — F02C18 Dementia in other diseases classified elsewhere, severe, with other behavioral disturbance: Secondary | ICD-10-CM

## 2021-08-26 DIAGNOSIS — I959 Hypotension, unspecified: Secondary | ICD-10-CM

## 2021-08-26 DIAGNOSIS — Z978 Presence of other specified devices: Secondary | ICD-10-CM

## 2021-08-26 DIAGNOSIS — S72002A Fracture of unspecified part of neck of left femur, initial encounter for closed fracture: Secondary | ICD-10-CM | POA: Diagnosis not present

## 2021-08-26 DIAGNOSIS — E43 Unspecified severe protein-calorie malnutrition: Secondary | ICD-10-CM

## 2021-08-26 DIAGNOSIS — D62 Acute posthemorrhagic anemia: Secondary | ICD-10-CM

## 2021-08-26 DIAGNOSIS — R5381 Other malaise: Secondary | ICD-10-CM

## 2021-08-26 DIAGNOSIS — Z9109 Other allergy status, other than to drugs and biological substances: Secondary | ICD-10-CM

## 2021-08-26 DIAGNOSIS — G301 Alzheimer's disease with late onset: Secondary | ICD-10-CM

## 2021-08-26 MED ORDER — MIDODRINE HCL 10 MG PO TABS: ORAL_TABLET | ORAL | 1 refills | Status: DC

## 2021-08-26 MED ORDER — MIDODRINE HCL 10 MG PO TABS: ORAL_TABLET | ORAL | 1 refills | Status: AC

## 2021-08-26 MED ORDER — ASPIRIN 81 MG PO TBEC: 81.0000 mg | DELAYED_RELEASE_TABLET | Freq: Every day | ORAL | 12 refills | Status: AC

## 2021-08-26 MED ORDER — LEVOCETIRIZINE DIHYDROCHLORIDE 5 MG PO TABS: 5.0000 mg | ORAL_TABLET | Freq: Every evening | ORAL | 2 refills | Status: DC
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
## 2021-08-26 NOTE — Patient Instructions (Addendum)
Stop zyrtec start xzyal 5 mg by mouth daily for allergies- take for 2 weeks then resume zyrtec  ?

## 2021-09-08 ENCOUNTER — Telehealth: Payer: Self-pay | Admitting: *Deleted

## 2021-09-08 NOTE — Telephone Encounter (Signed)
[name] with [facility] called requesting verbal orders for PT 2X8 weeks.  ?Verbal orders given.  ?

## 2021-10-07 ENCOUNTER — Telehealth: Payer: Self-pay

## 2021-10-07 NOTE — Telephone Encounter (Signed)
Ok to approve 

## 2021-10-07 NOTE — Telephone Encounter (Signed)
Anderson Malta the Physical Therapist from [facility] called and states that she needs order for patient to have speech therapy and swallowing. Message sent to PCP [name], NP . Please Advise.  ?

## 2021-10-07 NOTE — Telephone Encounter (Signed)
Called and notified Anderson Malta. She states that she will send over fax for signature.  ?

## 2021-10-11 ENCOUNTER — Telehealth: Payer: Self-pay

## 2021-10-11 ENCOUNTER — Telehealth: Payer: Self-pay | Admitting: *Deleted

## 2021-10-11 NOTE — Telephone Encounter (Signed)
FMLA Paperwork was left on my Desk.  ?Ivonne Andrew, Daughter, dropped off paperwork to have filled out for her work.  ? ?Placed paperwork in [place] folder to review, fill out and sign.  ?

## 2021-10-11 NOTE — Telephone Encounter (Signed)
Dave Chandler, NP notified. ?

## 2021-10-11 NOTE — Telephone Encounter (Signed)
Noted thank you

## 2021-10-11 NOTE — Telephone Encounter (Signed)
[name] from [facility] wanted to notify [name], NP for a fall report. Prince Solian stated that the patient had slipped out of recliner. Patient stated that he is not in any pain from the fall. Prince Solian stated that they were able to get him into the bed.  ? ? ?

## 2021-10-14 ENCOUNTER — Other Ambulatory Visit: Payer: Self-pay | Admitting: Adult Health

## 2021-10-14 NOTE — Telephone Encounter (Signed)
Patient has request refill on medication "Dorzolamide-timolol". Last refill dated 07/08/2021 by Seth Bake, NP. I'm unsure if this ongoing medication for patient and if refill are appropriate. Medication pend and sent to PCP [name], NP .  ?

## 2021-10-14 NOTE — Telephone Encounter (Signed)
Paperwork filled out and signed.  ?Levada Dy, daughter will pick up. Left up front in drawer.  ?Copy sent to [place].  ?

## 2021-10-22 ENCOUNTER — Encounter: Payer: Self-pay | Admitting: Nurse Practitioner

## 2021-10-24 ENCOUNTER — Encounter: Payer: Self-pay | Admitting: *Deleted

## 2021-10-24 NOTE — Telephone Encounter (Signed)
error 

## 2021-10-24 NOTE — Telephone Encounter (Signed)
Forms Printed and placed in [place] folder to review, fill out and sign.  ?

## 2021-10-26 NOTE — Telephone Encounter (Signed)
Forms Completed. Spoke with Levada Dy and she will pick up [employer] today.  ?Placed up front for pick up.  ?Copy sent to [place].  ?

## 2021-11-08 ENCOUNTER — Telehealth: Payer: Self-pay | Admitting: *Deleted

## 2021-11-08 NOTE — Telephone Encounter (Signed)
Mickel Baas with [facility] called and stated that patient's pulse this morning was 56 (perimeter 60-100).   Stated that patient was lethargic and sleepy but stated this is normal for patient due to Dementia phases.   Wife thinks patient has a UTI. Patient is scheduled to see Urologist Tomorrow.   FYI

## 2021-11-10 ENCOUNTER — Telehealth: Payer: Self-pay

## 2021-11-10 NOTE — Telephone Encounter (Signed)
Incoming call received from physical therapist with [facility] asking for verbal orders for   PT: 2 times weekly for 8 weeks ST: for cognitive retrain  Per standing order, verbal order was provided. Message will be sent to patient PCP as a FYI

## 2021-11-30 ENCOUNTER — Other Ambulatory Visit: Payer: Self-pay | Admitting: Nurse Practitioner

## 2021-11-30 DIAGNOSIS — Z9109 Other allergy status, other than to drugs and biological substances: Secondary | ICD-10-CM

## 2021-12-08 ENCOUNTER — Telehealth: Payer: Self-pay | Admitting: *Deleted

## 2021-12-08 NOTE — Telephone Encounter (Signed)
Hannah with [facility] called requesting verbal orders for ST 1X4weeks.   Verbal orders given.

## 2021-12-15 ENCOUNTER — Telehealth: Payer: Self-pay

## 2021-12-15 NOTE — Telephone Encounter (Signed)
[name] a PT assistant with [facility] states. Pulse was 56 today which is outside of his normal parameters of 60 and 100. It's protocol to notify doctor when that happens. Wife states he hasn't had much fluids today which may be the cause of the low pulse. [phone]

## 2021-12-15 NOTE — Telephone Encounter (Signed)
Discussed with Mickel Baas of [facility]'s response. Mickel Baas states his pulse did go to 64 at the end of session after encouraging fluids.

## 2021-12-15 NOTE — Telephone Encounter (Signed)
Lets have them continue to monitor this, also to report any symptoms.

## 2021-12-28 ENCOUNTER — Telehealth: Payer: Self-pay | Admitting: *Deleted

## 2021-12-28 NOTE — Telephone Encounter (Signed)
[name] with [facility] called requesting verbal orders for Skilled Nursing due to patient starting to develop another Pressure Injury.   Verbal orders ok, please Advise.

## 2021-12-28 NOTE — Telephone Encounter (Signed)
Yes okay to approve home health nursing

## 2021-12-28 NOTE — Telephone Encounter (Signed)
Autumn with [facility] Notified and agreed.

## 2021-12-29 ENCOUNTER — Telehealth: Payer: Self-pay

## 2021-12-29 NOTE — Telephone Encounter (Signed)
Thanks, I called Mickel Baas and notified her.

## 2021-12-29 NOTE — Telephone Encounter (Signed)
Noted to continue to monitor and notify for any additional symptoms.

## 2021-12-29 NOTE — Telephone Encounter (Signed)
Mickel Baas from [facility] called and states that patient pulse is 56. Normal for patient is between 60-100. She states that it's protocol for them to notify provider if vital is out of normal range. Message routed to PCP Dewaine Oats, [name], NP

## 2022-01-03 ENCOUNTER — Telehealth: Payer: Self-pay | Admitting: *Deleted

## 2022-01-03 NOTE — Telephone Encounter (Signed)
Mickel Baas with [facility] called and stated that patient's Pulse was out of Perimeter of 56 with abnormal heart rhythm. No symptoms noted.   FYI

## 2022-01-05 ENCOUNTER — Telehealth: Payer: Self-pay | Admitting: *Deleted

## 2022-01-05 NOTE — Telephone Encounter (Signed)
Audrey with [facility] called requesting verbal orders for 1x1week evaluation and treat for Sacral Wound, Small stage II to sacral area.   Verbal orders given.

## 2022-01-06 ENCOUNTER — Telehealth: Payer: Self-pay | Admitting: *Deleted

## 2022-01-06 NOTE — Telephone Encounter (Signed)
Anderson Malta with [facility] called and stated that they Recertified patient for PT Requesting verbal orders for 1X9weeks.   Verbal order given.

## 2022-01-17 ENCOUNTER — Telehealth: Payer: Self-pay | Admitting: *Deleted

## 2022-01-17 NOTE — Telephone Encounter (Signed)
Noted, to let us know if symptoms do not improve

## 2022-01-17 NOTE — Telephone Encounter (Signed)
Mickel Baas with [facility] called and stated that patient's Pulse was out of the Perimeter of 48.   Stated that he has some Decreased alertness but he has not drank much fluid this morning. Stated that he took an Allergy pill last night before bed also which causes drowsiness and inability to drink. No other concerns noted.   They are working on getting him drinking more fluids this morning.   FYI

## 2022-02-16 ENCOUNTER — Ambulatory Visit: Payer: Medicare Other | Admitting: Nurse Practitioner

## 2022-02-17 ENCOUNTER — Ambulatory Visit (INDEPENDENT_AMBULATORY_CARE_PROVIDER_SITE_OTHER): Payer: Medicare Other | Admitting: Nurse Practitioner

## 2022-02-17 ENCOUNTER — Encounter: Payer: Self-pay | Admitting: Cardiology

## 2022-02-17 ENCOUNTER — Encounter: Payer: Self-pay | Admitting: Nurse Practitioner

## 2022-02-17 ENCOUNTER — Ambulatory Visit: Payer: Medicare Other | Attending: Cardiology | Admitting: Cardiology

## 2022-02-17 VITALS — BP 108/80 | HR 62 | Temp 97.7°F | Resp 16 | Ht 67.0 in | Wt 106.0 lb

## 2022-02-17 VITALS — BP 98/60 | HR 70 | Ht 67.0 in | Wt 110.0 lb

## 2022-02-17 DIAGNOSIS — Z978 Presence of other specified devices: Secondary | ICD-10-CM

## 2022-02-17 DIAGNOSIS — I4510 Unspecified right bundle-branch block: Secondary | ICD-10-CM

## 2022-02-17 DIAGNOSIS — F0390 Unspecified dementia without behavioral disturbance: Secondary | ICD-10-CM

## 2022-02-17 DIAGNOSIS — G4733 Obstructive sleep apnea (adult) (pediatric): Secondary | ICD-10-CM | POA: Diagnosis not present

## 2022-02-17 DIAGNOSIS — L89152 Pressure ulcer of sacral region, stage 2: Secondary | ICD-10-CM

## 2022-02-17 DIAGNOSIS — E43 Unspecified severe protein-calorie malnutrition: Secondary | ICD-10-CM | POA: Diagnosis not present

## 2022-02-17 DIAGNOSIS — I10 Essential (primary) hypertension: Secondary | ICD-10-CM | POA: Diagnosis not present

## 2022-02-17 DIAGNOSIS — Z9989 Dependence on other enabling machines and devices: Secondary | ICD-10-CM

## 2022-02-17 DIAGNOSIS — Z23 Encounter for immunization: Secondary | ICD-10-CM

## 2022-02-17 DIAGNOSIS — N1832 Chronic kidney disease, stage 3b: Secondary | ICD-10-CM

## 2022-02-17 DIAGNOSIS — Z9109 Other allergy status, other than to drugs and biological substances: Secondary | ICD-10-CM | POA: Diagnosis not present

## 2022-02-17 DIAGNOSIS — R5381 Other malaise: Secondary | ICD-10-CM
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
## 2022-02-17 NOTE — Patient Instructions (Addendum)
To use foam dressing to bottom- make sure area is clean- can stay on up to 3 days  Can get at medical supply store.
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
## 2022-02-17 NOTE — Patient Instructions (Signed)

## 2022-02-18 LAB — COMPLETE METABOLIC PANEL WITH GFR
AG Ratio: 1.1 (calc) (ref 1.0–2.5)
ALT: 8 U/L — ABNORMAL LOW (ref 9–46)
AST: 17 U/L (ref 10–35)
Albumin: 3.4 g/dL — ABNORMAL LOW (ref 3.6–5.1)
Alkaline phosphatase (APISO): 163 U/L — ABNORMAL HIGH (ref 35–144)
BUN: 16 mg/dL (ref 7–25)
CO2: 23 mmol/L (ref 20–32)
Calcium: 9.2 mg/dL (ref 8.6–10.3)
Chloride: 104 mmol/L (ref 98–110)
Creat: 1.19 mg/dL (ref 0.70–1.22)
Globulin: 3.1 g/dL (calc) (ref 1.9–3.7)
Glucose, Bld: 113 mg/dL — ABNORMAL HIGH (ref 65–99)
Potassium: 4.2 mmol/L (ref 3.5–5.3)
Sodium: 138 mmol/L (ref 135–146)
Total Bilirubin: 0.4 mg/dL (ref 0.2–1.2)
Total Protein: 6.5 g/dL (ref 6.1–8.1)
eGFR: 57 mL/min/{1.73_m2} — ABNORMAL LOW (ref >=60)

## 2022-02-18 LAB — CBC WITH DIFFERENTIAL/PLATELET
Absolute Monocytes: 422 cells/uL (ref 200–950)
Basophils Absolute: 29 cells/uL (ref 0–200)
Basophils Relative: 0.7 %
Eosinophils Absolute: 139 cells/uL (ref 15–500)
Eosinophils Relative: 3.4 %
HCT: 36.2 % — ABNORMAL LOW (ref 38.5–50.0)
Hemoglobin: 12 g/dL — ABNORMAL LOW (ref 13.2–17.1)
Lymphs Abs: 1382 cells/uL (ref 850–3900)
MCH: 31.6 pg (ref 27.0–33.0)
MCHC: 33.1 g/dL (ref 32.0–36.0)
MCV: 95.3 fL (ref 80.0–100.0)
MPV: 10.7 fL (ref 7.5–12.5)
Monocytes Relative: 10.3 %
Neutro Abs: 2128 cells/uL (ref 1500–7800)
Neutrophils Relative %: 51.9 %
Platelets: 139 10*3/uL — ABNORMAL LOW (ref 140–400)
RBC: 3.8 10*6/uL — ABNORMAL LOW (ref 4.20–5.80)
RDW: 12.5 % (ref 11.0–15.0)
Total Lymphocyte: 33.7 %
WBC: 4.1 10*3/uL (ref 3.8–10.8)

## 2022-03-03 ENCOUNTER — Ambulatory Visit: Payer: Medicare Other | Admitting: Nurse Practitioner

## 2022-03-24 ENCOUNTER — Telehealth: Payer: Self-pay

## 2022-03-24 NOTE — Telephone Encounter (Signed)
Anderson Malta w/ [facility] called to advise [name] that patient's home health service is stopped due to insurance no longer covering there services.

## 2022-03-28 ENCOUNTER — Telehealth: Payer: Self-pay

## 2022-03-28 ENCOUNTER — Encounter: Payer: Medicare Other | Admitting: Nurse Practitioner

## 2022-03-28 NOTE — Telephone Encounter (Signed)
Transition Care Management Unsuccessful Follow-up Telephone Call  Date of discharge and from where:  [facility], 03/24/2022  Attempts:  1st Attempt  Reason for unsuccessful TCM follow-up call:  Unable to reach patient, spoke with patients wife who states that patient has not been in the hospital since January of 2023 no appt scheduled at this time other than AW[facility]

## 2022-04-10 ENCOUNTER — Ambulatory Visit (INDEPENDENT_AMBULATORY_CARE_PROVIDER_SITE_OTHER): Payer: Medicare Other | Admitting: Nurse Practitioner

## 2022-04-10 DIAGNOSIS — Z0000 Encounter for general adult medical examination without abnormal findings: Secondary | ICD-10-CM

## 2022-04-10 NOTE — Patient Instructions (Signed)
[name] , Thank you for taking time to come for your Medicare Wellness Visit. I appreciate your ongoing commitment to your health goals. Please review the following plan we discussed and let me know if I can assist you in the future.   Screening recommendations/referrals: Colonoscopy aged out Recommended yearly ophthalmology/optometry visit for glaucoma screening and checkup Recommended yearly dental visit for hygiene and checkup  Vaccinations: Influenza vaccine due annually in September/October Pneumococcal vaccine up to date Tdap vaccine DUE- recommend to get at your local pharmacy` Shingles vaccine up to date    Advanced directives: on file  Conditions/risks identified: advanced age, memory loss, debility  Next appointment: yearly, next year in person   Preventive Care 51 Years and Older, Male Preventive care refers to lifestyle choices and visits with your health care provider that can promote health and wellness. What does preventive care include? A yearly physical exam. This is also called an annual well check. Dental exams once or twice a year. Routine eye exams. Ask your health care provider how often you should have your eyes checked. Personal lifestyle choices, including: Daily care of your teeth and gums. Regular physical activity. Eating a healthy diet. Avoiding tobacco and drug use. Limiting alcohol use. Practicing safe sex. Taking low doses of aspirin every day. Taking vitamin and mineral supplements as recommended by your health care provider. What happens during an annual well check? The services and screenings done by your health care provider during your annual well check will depend on your age, overall health, lifestyle risk factors, and family history of disease. Counseling  Your health care provider may ask you questions about your: Alcohol use. Tobacco use. Drug use. Emotional well-being. Home and relationship well-being. Sexual activity. Eating  habits. History of falls. Memory and ability to understand (cognition). Work and work Statistician. Screening  You may have the following tests or measurements: Height, weight, and BMI. Blood pressure. Lipid and cholesterol levels. These may be checked every 5 years, or more frequently if you are over 59 years old. Skin check. Lung cancer screening. You may have this screening every year starting at age 84 if you have a 30-pack-year history of smoking and currently smoke or have quit within the past 15 years. Fecal occult blood test (FOBT) of the stool. You may have this test every year starting at age 86. Flexible sigmoidoscopy or colonoscopy. You may have a sigmoidoscopy every 5 years or a colonoscopy every 10 years starting at age 86. Prostate cancer screening. Recommendations will vary depending on your family history and other risks. Hepatitis C blood test. Hepatitis B blood test. Sexually transmitted disease (STD) testing. Diabetes screening. This is done by checking your blood sugar (glucose) after you have not eaten for a while (fasting). You may have this done every 1-3 years. Abdominal aortic aneurysm (AAA) screening. You may need this if you are a current or former smoker. Osteoporosis. You may be screened starting at age 59 if you are at high risk. Talk with your health care provider about your test results, treatment options, and if necessary, the need for more tests. Vaccines  Your health care provider may recommend certain vaccines, such as: Influenza vaccine. This is recommended every year. Tetanus, diphtheria, and acellular pertussis (Tdap, Td) vaccine. You may need a Td booster every 10 years. Zoster vaccine. You may need this after age 86. Pneumococcal 13-valent conjugate (PCV13) vaccine. One dose is recommended after age 86. Pneumococcal polysaccharide (PPSV23) vaccine. One dose is recommended after age  86. Talk to your health care provider about which screenings and  vaccines you need and how often you need them. This information is not intended to replace advice given to you by your health care provider. Make sure you discuss any questions you have with your health care provider. Document Released: 06/25/2015 Document Revised: 02/16/2016 Document Reviewed: 03/30/2015 Elsevier Interactive Patient Education  2017 [employer] Prevention in the Home Falls can cause injuries. They can happen to people of all ages. There are many things you can do to make your home safe and to help prevent falls. What can I do on the outside of my home? Regularly fix the edges of walkways and driveways and fix any cracks. Remove anything that might make you trip as you walk through a door, such as a raised step or threshold. Trim any bushes or trees on the path to your home. Use bright outdoor lighting. Clear any walking paths of anything that might make someone trip, such as rocks or tools. Regularly check to see if handrails are loose or broken. Make sure that both sides of any steps have handrails. Any raised decks and porches should have guardrails on the edges. Have any leaves, snow, or ice cleared regularly. Use sand or salt on walking paths during winter. Clean up any spills in your garage right away. This includes oil or grease spills. What can I do in the bathroom? Use night lights. Install grab bars by the toilet and in the tub and shower. Do not use towel bars as grab bars. Use non-skid mats or decals in the tub or shower. If you need to sit down in the shower, use a plastic, non-slip stool. Keep the floor dry. Clean up any water that spills on the floor as soon as it happens. Remove soap buildup in the tub or shower regularly. Attach bath mats securely with double-sided non-slip rug tape. Do not have throw rugs and other things on the floor that can make you trip. What can I do in the bedroom? Use night lights. Make sure that you have a light by your  bed that is easy to reach. Do not use any sheets or blankets that are too big for your bed. They should not hang down onto the floor. Have a firm chair that has side arms. You can use this for support while you get dressed. Do not have throw rugs and other things on the floor that can make you trip. What can I do in the kitchen? Clean up any spills right away. Avoid walking on wet floors. Keep items that you use a lot in easy-to-reach places. If you need to reach something above you, use a strong step stool that has a grab bar. Keep electrical cords out of the way. Do not use floor polish or wax that makes floors slippery. If you must use wax, use non-skid floor wax. Do not have throw rugs and other things on the floor that can make you trip. What can I do with my stairs? Do not leave any items on the stairs. Make sure that there are handrails on both sides of the stairs and use them. Fix handrails that are broken or loose. Make sure that handrails are as long as the stairways. Check any carpeting to make sure that it is firmly attached to the stairs. Fix any carpet that is loose or worn. Avoid having throw rugs at the top or bottom of the stairs. If you do have  throw rugs, attach them to the floor with carpet tape. Make sure that you have a light switch at the top of the stairs and the bottom of the stairs. If you do not have them, ask someone to add them for you. What else can I do to help prevent falls? Wear shoes that: Do not have high heels. Have rubber bottoms. Are comfortable and fit you well. Are closed at the toe. Do not wear sandals. If you use a stepladder: Make sure that it is fully opened. Do not climb a closed stepladder. Make sure that both sides of the stepladder are locked into place. Ask someone to hold it for you, if possible. Clearly mark and make sure that you can see: Any grab bars or handrails. First and last steps. Where the edge of each step is. Use tools that  help you move around (mobility aids) if they are needed. These include: Canes. Walkers. Scooters. Crutches. Turn on the lights when you go into a dark area. Replace any light bulbs as soon as they burn out. Set up your furniture so you have a clear path. Avoid moving your furniture around. If any of your floors are uneven, fix them. If there are any pets around you, be aware of where they are. Review your medicines with your doctor. Some medicines can make you feel dizzy. This can increase your chance of falling. Ask your doctor what other things that you can do to help prevent falls. This information is not intended to replace advice given to you by your health care provider. Make sure you discuss any questions you have with your health care provider. Document Released: 03/25/2009 Document Revised: 11/04/2015 Document Reviewed: 07/03/2014 Elsevier Interactive Patient Education  2017 [employer].
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
## 2022-05-28 ENCOUNTER — Emergency Department (HOSPITAL_BASED_OUTPATIENT_CLINIC_OR_DEPARTMENT_OTHER): Payer: Medicare Other

## 2022-05-28 ENCOUNTER — Other Ambulatory Visit: Payer: Self-pay

## 2022-05-28 ENCOUNTER — Emergency Department (HOSPITAL_BASED_OUTPATIENT_CLINIC_OR_DEPARTMENT_OTHER)
Admission: EM | Admit: 2022-05-28 | Discharge: 2022-05-28 | Disposition: A | Discharge status: Home / Self Care | Payer: Medicare Other | Attending: Emergency Medicine | Admitting: Emergency Medicine

## 2022-05-28 ENCOUNTER — Encounter (HOSPITAL_BASED_OUTPATIENT_CLINIC_OR_DEPARTMENT_OTHER): Payer: Self-pay | Admitting: Urology

## 2022-05-28 DIAGNOSIS — I129 Hypertensive chronic kidney disease with stage 1 through stage 4 chronic kidney disease, or unspecified chronic kidney disease: Secondary | ICD-10-CM | POA: Insufficient documentation

## 2022-05-28 DIAGNOSIS — U071 COVID-19: Secondary | ICD-10-CM | POA: Insufficient documentation

## 2022-05-28 DIAGNOSIS — N183 Chronic kidney disease, stage 3 unspecified: Secondary | ICD-10-CM | POA: Diagnosis not present

## 2022-05-28 DIAGNOSIS — R059 Cough, unspecified: Secondary | ICD-10-CM | POA: Diagnosis present

## 2022-05-28 DIAGNOSIS — F0390 Unspecified dementia without behavioral disturbance: Secondary | ICD-10-CM | POA: Insufficient documentation

## 2022-05-28 LAB — RESP PANEL BY RT-PCR (RSV, FLU A&B, COVID)  RVPGX2
Influenza A by PCR: NEGATIVE
Influenza B by PCR: NEGATIVE
Resp Syncytial Virus by PCR: NEGATIVE
SARS Coronavirus 2 by RT PCR: POSITIVE — AB
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
## 2022-05-28 NOTE — ED Triage Notes (Signed)
Per wife pt has had chest congestion and cough since Wednesday  Also states runny nose and sinus congestion  Denies fever   Per wife pt has been more weak than normal the past two days but is doing better today  Eating and drinking normally   H/o dementia progressing and wheelchair bound at [place]

## 2022-05-28 NOTE — Discharge Instructions (Signed)
You were seen in the emergency department today and diagnosed with COVID-19.  With over 1 week of symptoms you are not a candidate for antiviral medicines but continue to drink plenty of fluids, get rest, treat any fever body aches with Tylenol.  If you develop shortness of breath, chest pain, other sudden severe symptoms such no longer eating/drinking or no longer making urine you should return to the emergency department for reevaluation.

## 2022-05-29 ENCOUNTER — Telehealth: Payer: Self-pay

## 2022-05-29 NOTE — Telephone Encounter (Signed)
Transition Care Management Follow-up Telephone Call Date of discharge and from where: 05/28/2022, [facility] How have you been since you were released from the hospital? better Any questions or concerns? Yes  Items Reviewed: Did the pt receive and understand the discharge instructions provided? Yes  Medications obtained and verified? Yes  Other? No  Any new allergies since your discharge? No  Dietary orders reviewed? No Do you have support at home? Yes   Home Care and Equipment/Supplies: Were home health services ordered? not applicable If so, what is the name of the agency? N/a  Has the agency set up a time to come to the patient's home? not applicable Were any new equipment or medical supplies ordered?  No What is the name of the medical supply agency? N/A Were you able to get the supplies/equipment? no Do you have any questions related to the use of the equipment or supplies? No  Functional Questionnaire: (I = Independent and D = Dependent) ADLs: I   Bathing/Dressing- I  Meal Prep- I  Eating- I  Maintaining continence- I  Transferring/Ambulation- I  Managing Meds- I  Follow up appointments reviewed:  PCP Hospital f/u appt confirmed? Yes  Scheduled to see Durenda Age, NP on 06/16/2022 @ 140PM. [facility] f/u appt confirmed? No  Scheduled to see N/A on N/A @ N/A. Are transportation arrangements needed? No  If their condition worsens, is the pt aware to call PCP or go to the Emergency Dept.? Yes Was the patient provided with contact information for the PCP's office or ED? Yes Was to pt encouraged to call back with questions or concerns? Yes

## 2022-06-11 ENCOUNTER — Other Ambulatory Visit: Payer: Self-pay

## 2022-06-11 ENCOUNTER — Emergency Department (HOSPITAL_COMMUNITY): Payer: Medicare Other

## 2022-06-11 ENCOUNTER — Inpatient Hospital Stay (HOSPITAL_COMMUNITY)
Admission: EM | Admit: 2022-06-11 | Discharge: 2022-06-15 | DRG: 699 | Disposition: A | Discharge status: Home Health | Payer: Medicare Other | Attending: Family Medicine | Admitting: Family Medicine

## 2022-06-11 DIAGNOSIS — N183 Chronic kidney disease, stage 3 unspecified: Secondary | ICD-10-CM | POA: Diagnosis present

## 2022-06-11 DIAGNOSIS — L89159 Pressure ulcer of sacral region, unspecified stage: Secondary | ICD-10-CM | POA: Diagnosis present

## 2022-06-11 DIAGNOSIS — Z8616 Personal history of COVID-19: Secondary | ICD-10-CM

## 2022-06-11 DIAGNOSIS — N401 Enlarged prostate with lower urinary tract symptoms: Secondary | ICD-10-CM | POA: Diagnosis present

## 2022-06-11 DIAGNOSIS — Z8249 Family history of ischemic heart disease and other diseases of the circulatory system: Secondary | ICD-10-CM

## 2022-06-11 DIAGNOSIS — Z681 Body mass index (BMI) 19 or less, adult: Secondary | ICD-10-CM

## 2022-06-11 DIAGNOSIS — F03C18 Unspecified dementia, severe, with other behavioral disturbance: Secondary | ICD-10-CM | POA: Diagnosis present

## 2022-06-11 DIAGNOSIS — E872 Acidosis, unspecified: Secondary | ICD-10-CM | POA: Diagnosis present

## 2022-06-11 DIAGNOSIS — F03918 Unspecified dementia, unspecified severity, with other behavioral disturbance: Secondary | ICD-10-CM | POA: Diagnosis present

## 2022-06-11 DIAGNOSIS — G4733 Obstructive sleep apnea (adult) (pediatric): Secondary | ICD-10-CM | POA: Diagnosis present

## 2022-06-11 DIAGNOSIS — U071 COVID-19: Principal | ICD-10-CM | POA: Diagnosis present

## 2022-06-11 DIAGNOSIS — Z7401 Bed confinement status: Secondary | ICD-10-CM

## 2022-06-11 DIAGNOSIS — Z79899 Other long term (current) drug therapy: Secondary | ICD-10-CM

## 2022-06-11 DIAGNOSIS — E870 Hyperosmolality and hypernatremia: Secondary | ICD-10-CM | POA: Diagnosis present

## 2022-06-11 DIAGNOSIS — E876 Hypokalemia: Secondary | ICD-10-CM | POA: Diagnosis present

## 2022-06-11 DIAGNOSIS — Z66 Do not resuscitate: Secondary | ICD-10-CM | POA: Diagnosis present

## 2022-06-11 DIAGNOSIS — Z96642 Presence of left artificial hip joint: Secondary | ICD-10-CM | POA: Diagnosis present

## 2022-06-11 DIAGNOSIS — N179 Acute kidney failure, unspecified: Secondary | ICD-10-CM | POA: Diagnosis not present

## 2022-06-11 DIAGNOSIS — N472 Paraphimosis: Secondary | ICD-10-CM | POA: Diagnosis present

## 2022-06-11 DIAGNOSIS — Z9079 Acquired absence of other genital organ(s): Secondary | ICD-10-CM

## 2022-06-11 DIAGNOSIS — Z809 Family history of malignant neoplasm, unspecified: Secondary | ICD-10-CM

## 2022-06-11 DIAGNOSIS — E860 Dehydration: Secondary | ICD-10-CM | POA: Diagnosis present

## 2022-06-11 DIAGNOSIS — T83511A Infection and inflammatory reaction due to indwelling urethral catheter, initial encounter: Secondary | ICD-10-CM | POA: Diagnosis not present

## 2022-06-11 DIAGNOSIS — I129 Hypertensive chronic kidney disease with stage 1 through stage 4 chronic kidney disease, or unspecified chronic kidney disease: Secondary | ICD-10-CM | POA: Diagnosis present

## 2022-06-11 DIAGNOSIS — N289 Disorder of kidney and ureter, unspecified: Secondary | ICD-10-CM

## 2022-06-11 DIAGNOSIS — Z820 Family history of epilepsy and other diseases of the nervous system: Secondary | ICD-10-CM

## 2022-06-11 DIAGNOSIS — Z978 Presence of other specified devices: Secondary | ICD-10-CM

## 2022-06-11 DIAGNOSIS — Y846 Urinary catheterization as the cause of abnormal reaction of the patient, or of later complication, without mention of misadventure at the time of the procedure: Secondary | ICD-10-CM | POA: Diagnosis present

## 2022-06-11 DIAGNOSIS — R748 Abnormal levels of other serum enzymes: Secondary | ICD-10-CM | POA: Diagnosis present

## 2022-06-11 DIAGNOSIS — I959 Hypotension, unspecified: Secondary | ICD-10-CM | POA: Diagnosis not present

## 2022-06-11 DIAGNOSIS — Z1624 Resistance to multiple antibiotics: Secondary | ICD-10-CM | POA: Diagnosis present

## 2022-06-11 DIAGNOSIS — I9589 Other hypotension: Secondary | ICD-10-CM | POA: Diagnosis present

## 2022-06-11 DIAGNOSIS — E785 Hyperlipidemia, unspecified: Secondary | ICD-10-CM | POA: Diagnosis present

## 2022-06-11 DIAGNOSIS — E861 Hypovolemia: Secondary | ICD-10-CM | POA: Diagnosis present

## 2022-06-11 DIAGNOSIS — R64 Cachexia: Secondary | ICD-10-CM | POA: Diagnosis present

## 2022-06-11 DIAGNOSIS — N390 Urinary tract infection, site not specified: Secondary | ICD-10-CM | POA: Diagnosis present

## 2022-06-11 DIAGNOSIS — Z7982 Long term (current) use of aspirin: Secondary | ICD-10-CM

## 2022-06-11 DIAGNOSIS — Z87891 Personal history of nicotine dependence: Secondary | ICD-10-CM

## 2022-06-11 NOTE — ED Triage Notes (Signed)
Patient arrived with EMS from home family reported strong urine odor /concentrated urine this week with poor appetite , fatigue and intermittent confusion .

## 2022-06-11 NOTE — ED Provider Triage Note (Signed)
  Emergency Medicine Provider Triage Evaluation Note  MRN:  [id number]  Arrival date & time: 06/11/22    Medically screening exam initiated at 11:03 PM.   CC:   Confusion  HPI:  [name] is a 88 y.o. year-old male presents to the ED with chief complaint of confusion. Questionable UTI.  Recent COVD. Patient unable to participate in history.  History provided by RN. ROS:  -As included in HPI PE:   Vitals:   06/11/22 2301  BP: 125/86  Pulse: (!) 128  Resp: 18  Temp: 98.5 F (36.9 C)  SpO2: 96%    Non-toxic appearing No respiratory distress Tachycardic MDM:  Based on signs and symptoms, sepsis is highest on my differential. I've ordered sepsis labs in triage to expedite lab/diagnostic workup.  Patient was informed that the remainder of the evaluation will be completed by another provider, this initial triage assessment does not replace that evaluation, and the importance of remaining in the ED until their evaluation is complete.    Montine Circle, PA-C 06/11/22 2305

## 2022-06-12 ENCOUNTER — Emergency Department (HOSPITAL_COMMUNITY): Payer: Medicare Other

## 2022-06-12 ENCOUNTER — Encounter (HOSPITAL_COMMUNITY): Payer: Self-pay | Admitting: Internal Medicine

## 2022-06-12 DIAGNOSIS — I959 Hypotension, unspecified: Secondary | ICD-10-CM | POA: Diagnosis not present

## 2022-06-12 DIAGNOSIS — E870 Hyperosmolality and hypernatremia: Secondary | ICD-10-CM | POA: Diagnosis present

## 2022-06-12 DIAGNOSIS — R64 Cachexia: Secondary | ICD-10-CM | POA: Diagnosis present

## 2022-06-12 DIAGNOSIS — Z96642 Presence of left artificial hip joint: Secondary | ICD-10-CM | POA: Diagnosis present

## 2022-06-12 DIAGNOSIS — Y846 Urinary catheterization as the cause of abnormal reaction of the patient, or of later complication, without mention of misadventure at the time of the procedure: Secondary | ICD-10-CM | POA: Diagnosis present

## 2022-06-12 DIAGNOSIS — E785 Hyperlipidemia, unspecified: Secondary | ICD-10-CM | POA: Diagnosis present

## 2022-06-12 DIAGNOSIS — Z1624 Resistance to multiple antibiotics: Secondary | ICD-10-CM | POA: Diagnosis present

## 2022-06-12 DIAGNOSIS — N179 Acute kidney failure, unspecified: Secondary | ICD-10-CM | POA: Diagnosis present

## 2022-06-12 DIAGNOSIS — E860 Dehydration: Secondary | ICD-10-CM | POA: Diagnosis present

## 2022-06-12 DIAGNOSIS — U071 COVID-19: Secondary | ICD-10-CM | POA: Diagnosis not present

## 2022-06-12 DIAGNOSIS — Z87891 Personal history of nicotine dependence: Secondary | ICD-10-CM | POA: Diagnosis not present

## 2022-06-12 DIAGNOSIS — L89159 Pressure ulcer of sacral region, unspecified stage: Secondary | ICD-10-CM | POA: Diagnosis present

## 2022-06-12 DIAGNOSIS — Z8616 Personal history of COVID-19: Secondary | ICD-10-CM | POA: Diagnosis not present

## 2022-06-12 DIAGNOSIS — N183 Chronic kidney disease, stage 3 unspecified: Secondary | ICD-10-CM | POA: Diagnosis present

## 2022-06-12 DIAGNOSIS — E872 Acidosis, unspecified: Secondary | ICD-10-CM | POA: Diagnosis present

## 2022-06-12 DIAGNOSIS — Z681 Body mass index (BMI) 19 or less, adult: Secondary | ICD-10-CM | POA: Diagnosis not present

## 2022-06-12 DIAGNOSIS — N390 Urinary tract infection, site not specified: Secondary | ICD-10-CM

## 2022-06-12 DIAGNOSIS — Z79899 Other long term (current) drug therapy: Secondary | ICD-10-CM | POA: Diagnosis not present

## 2022-06-12 DIAGNOSIS — I129 Hypertensive chronic kidney disease with stage 1 through stage 4 chronic kidney disease, or unspecified chronic kidney disease: Secondary | ICD-10-CM | POA: Diagnosis present

## 2022-06-12 DIAGNOSIS — G4733 Obstructive sleep apnea (adult) (pediatric): Secondary | ICD-10-CM

## 2022-06-12 DIAGNOSIS — Z66 Do not resuscitate: Secondary | ICD-10-CM | POA: Diagnosis present

## 2022-06-12 DIAGNOSIS — N472 Paraphimosis: Secondary | ICD-10-CM | POA: Diagnosis not present

## 2022-06-12 DIAGNOSIS — I9589 Other hypotension: Secondary | ICD-10-CM | POA: Diagnosis present

## 2022-06-12 DIAGNOSIS — E876 Hypokalemia: Secondary | ICD-10-CM | POA: Diagnosis present

## 2022-06-12 DIAGNOSIS — Z7401 Bed confinement status: Secondary | ICD-10-CM | POA: Diagnosis not present

## 2022-06-12 DIAGNOSIS — Z978 Presence of other specified devices: Secondary | ICD-10-CM | POA: Diagnosis not present

## 2022-06-12 DIAGNOSIS — F03918 Unspecified dementia, unspecified severity, with other behavioral disturbance: Secondary | ICD-10-CM

## 2022-06-12 DIAGNOSIS — F03C18 Unspecified dementia, severe, with other behavioral disturbance: Secondary | ICD-10-CM | POA: Diagnosis present

## 2022-06-12 DIAGNOSIS — N401 Enlarged prostate with lower urinary tract symptoms: Secondary | ICD-10-CM | POA: Diagnosis present

## 2022-06-12 DIAGNOSIS — R748 Abnormal levels of other serum enzymes: Secondary | ICD-10-CM | POA: Diagnosis present

## 2022-06-12 DIAGNOSIS — T83511A Infection and inflammatory reaction due to indwelling urethral catheter, initial encounter: Secondary | ICD-10-CM | POA: Diagnosis present

## 2022-06-12 DIAGNOSIS — N289 Disorder of kidney and ureter, unspecified: Secondary | ICD-10-CM

## 2022-06-12 LAB — CBC WITH DIFFERENTIAL/PLATELET
Abs Immature Granulocytes: 0.02 10*3/uL (ref 0.00–0.07)
Basophils Absolute: 0 10*3/uL (ref 0.0–0.1)
Basophils Relative: 0 %
Eosinophils Absolute: 0 10*3/uL (ref 0.0–0.5)
Eosinophils Relative: 0 %
HCT: 41.6 % (ref 39.0–52.0)
Hemoglobin: 13.1 g/dL (ref 13.0–17.0)
Immature Granulocytes: 0 %
Lymphocytes Relative: 16 %
Lymphs Abs: 1.1 10*3/uL (ref 0.7–4.0)
MCH: 31.1 pg (ref 26.0–34.0)
MCHC: 31.5 g/dL (ref 30.0–36.0)
MCV: 98.8 fL (ref 80.0–100.0)
Monocytes Absolute: 0.6 10*3/uL (ref 0.1–1.0)
Monocytes Relative: 9 %
Neutro Abs: 5.1 10*3/uL (ref 1.7–7.7)
Neutrophils Relative %: 75 %
Platelets: 150 10*3/uL (ref 150–400)
RBC: 4.21 MIL/uL — ABNORMAL LOW (ref 4.22–5.81)
RDW: 13.3 % (ref 11.5–15.5)
WBC: 6.9 10*3/uL (ref 4.0–10.5)
nRBC: 0 % (ref 0.0–0.2)

## 2022-06-12 LAB — URINALYSIS, ROUTINE W REFLEX MICROSCOPIC

## 2022-06-12 LAB — RESP PANEL BY RT-PCR (RSV, FLU A&B, COVID)  RVPGX2
Influenza A by PCR: NEGATIVE
Influenza B by PCR: NEGATIVE
Resp Syncytial Virus by PCR: NEGATIVE
SARS Coronavirus 2 by RT PCR: POSITIVE — AB

## 2022-06-12 LAB — COMPREHENSIVE METABOLIC PANEL
ALT: 12 U/L (ref 0–44)
AST: 39 U/L (ref 15–41)
Albumin: 3.8 g/dL (ref 3.5–5.0)
Alkaline Phosphatase: 146 U/L — ABNORMAL HIGH (ref 38–126)
Anion gap: 17 — ABNORMAL HIGH (ref 5–15)
BUN: 52 mg/dL — ABNORMAL HIGH (ref 8–23)
CO2: 25 mmol/L (ref 22–32)
Calcium: 10.2 mg/dL (ref 8.9–10.3)
Chloride: 104 mmol/L (ref 98–111)
Creatinine, Ser: 1.44 mg/dL — ABNORMAL HIGH (ref 0.61–1.24)
GFR, Estimated: 45 mL/min — ABNORMAL LOW (ref >60)
Glucose, Bld: 118 mg/dL — ABNORMAL HIGH (ref 70–99)
Potassium: 4.7 mmol/L (ref 3.5–5.1)
Sodium: 146 mmol/L — ABNORMAL HIGH (ref 135–145)
Total Bilirubin: 1.3 mg/dL — ABNORMAL HIGH (ref 0.3–1.2)
Total Protein: 8 g/dL (ref 6.5–8.1)

## 2022-06-12 LAB — PROCALCITONIN: Procalcitonin: <0.1 ng/mL

## 2022-06-12 LAB — LACTIC ACID, PLASMA: Lactic Acid, Venous: 3.3 mmol/L (ref 0.5–1.9)

## 2022-06-12 LAB — URINALYSIS, MICROSCOPIC (REFLEX)
RBC / HPF: >50 RBC/hpf (ref 0–5)
WBC, UA: >50 WBC/hpf (ref 0–5)

## 2022-06-12 LAB — MAGNESIUM: Magnesium: 2 mg/dL (ref 1.7–2.4)

## 2022-06-12 LAB — PHOSPHORUS: Phosphorus: 2.8 mg/dL (ref 2.5–4.6)

## 2022-06-12 MED ORDER — ACETAMINOPHEN 650 MG RE SUPP: 650.0000 mg | Freq: Four times a day (QID) | RECTAL | Status: DC | PRN

## 2022-06-12 MED ORDER — PIPERACILLIN-TAZOBACTAM 3.375 G IVPB
3.3750 g | Freq: Three times a day (TID) | INTRAVENOUS | Status: DC
  Administered 2022-06-12 – 2022-06-15 (×10): 3.375 g via INTRAVENOUS
  Filled 2022-06-12 (×10): qty 50

## 2022-06-12 MED ORDER — SODIUM CHLORIDE 0.9 % IV SOLN: INTRAVENOUS | Status: AC

## 2022-06-12 MED ORDER — ACETAMINOPHEN 325 MG PO TABS: 650.0000 mg | ORAL_TABLET | Freq: Four times a day (QID) | ORAL | Status: DC | PRN

## 2022-06-12 MED ORDER — SODIUM CHLORIDE 0.9% FLUSH
3.0000 mL | Freq: Two times a day (BID) | INTRAVENOUS | Status: DC
  Administered 2022-06-13 – 2022-06-15 (×5): 3 mL via INTRAVENOUS

## 2022-06-12 MED ORDER — VITAMIN C 500 MG PO TABS
500.0000 mg | ORAL_TABLET | Freq: Every day | ORAL | Status: DC
  Administered 2022-06-12 – 2022-06-15 (×4): 500 mg via ORAL
  Filled 2022-06-12 (×4): qty 1

## 2022-06-12 MED ORDER — MIDODRINE HCL 5 MG PO TABS: 10.0000 mg | ORAL_TABLET | Freq: Three times a day (TID) | ORAL | Status: DC

## 2022-06-12 MED ORDER — GUAIFENESIN-DM 100-10 MG/5ML PO SYRP: 10.0000 mL | ORAL_SOLUTION | ORAL | Status: DC | PRN

## 2022-06-12 MED ORDER — ALBUTEROL SULFATE HFA 108 (90 BASE) MCG/ACT IN AERS
2.0000 | INHALATION_SPRAY | Freq: Four times a day (QID) | RESPIRATORY_TRACT | Status: DC
  Administered 2022-06-13 – 2022-06-14 (×4): 2 via RESPIRATORY_TRACT
  Filled 2022-06-12: qty 6.7

## 2022-06-12 MED ORDER — MIDODRINE HCL 5 MG PO TABS
10.0000 mg | ORAL_TABLET | Freq: Three times a day (TID) | ORAL | Status: DC
  Administered 2022-06-12 – 2022-06-14 (×7): 10 mg via ORAL
  Filled 2022-06-12 (×8): qty 2

## 2022-06-12 MED ORDER — ZINC SULFATE 220 (50 ZN) MG PO CAPS
220.0000 mg | ORAL_CAPSULE | Freq: Every day | ORAL | Status: DC
  Administered 2022-06-12 – 2022-06-15 (×4): 220 mg via ORAL
  Filled 2022-06-12 (×4): qty 1

## 2022-06-12 MED ORDER — ENOXAPARIN SODIUM 30 MG/0.3ML IJ SOSY
30.0000 mg | PREFILLED_SYRINGE | INTRAMUSCULAR | Status: DC
  Administered 2022-06-12 – 2022-06-15 (×4): 30 mg via SUBCUTANEOUS
  Filled 2022-06-12 (×4): qty 0.3

## 2022-06-12 MED ORDER — LORAZEPAM 2 MG/ML IJ SOLN
0.2500 mg | Freq: Four times a day (QID) | INTRAMUSCULAR | Status: DC | PRN
  Administered 2022-06-12 – 2022-06-15 (×2): 0.25 mg via INTRAVENOUS
  Filled 2022-06-12 (×2): qty 1

## 2022-06-12 NOTE — ED Notes (Signed)
The pts family at  the bedside reports that the pt is rfecovering from covid maybe 2 weeks  he had lost weight not eating or drinking
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
## 2022-06-12 NOTE — ED Provider Notes (Signed)
[facility] EMERGENCY DEPARTMENT Provider Note   CSN: [id number] Arrival date & time: 06/11/22  2241     History  Chief Complaint  Patient presents with   Malodorous Urine / Concentrated Urine    [name] is a 89 y.o. male.  Patient comes to the ER for evaluation of generalized weakness, poor oral intake.  Family members at the bedside provide information, patient does not really answer questions.  He does have a history of dementia but would normally be more interactive according to his family.  Multiple members of the family had COVID sometime in the last month.  Over the last week, patient has had decreased activity level.  He has not been eating or drinking for several days.  Family reports that he has a chronic indwelling Foley catheter since 2019.  This catheter was changed on December 13 at his urologist office.  He has a history of recurrent urinary tract infections and has had a change in the odor of his urine that suggest possible infection.       Home Medications Prior to Admission medications   Medication Sig Start Date End Date Taking? Authorizing Provider  acetaminophen (TYLENOL) 500 MG tablet Take 500 mg by mouth every 8 (eight) hours as needed.    [provider]  aspirin 81 MG EC tablet Take 1 tablet (81 mg total) by mouth daily. Swallow whole. 08/26/21   [name], NP  cetirizine (ZYRTEC) 10 MG tablet Take 10 mg by mouth as needed for allergies.    [provider]  feeding supplement (ENSURE ENLIVE / ENSURE PLUS) LIQD Take 237 mLs by mouth 3 (three) times daily between meals. 06/20/21   Mercy Riding, MD  levocetirizine (XYZAL) 5 MG tablet TAKE 1 TABLET BY MOUTH EVERY DAY IN THE EVENING Patient taking differently: Take 5 mg by mouth every evening. 11/30/21   [name], NP  midodrine (PROAMATINE) 10 MG tablet TAKE 1 TABLET BY MOUTH 3 TIMES DAILY WITH MEALS. Patient taking differently: Take 10 mg by mouth 3  (three) times daily. TAKE 1 TABLET BY MOUTH 3 TIMES DAILY WITH MEALS. 08/26/21   [name], NP  Multiple Vitamin (MULTIVITAMIN) tablet Take 1 tablet by mouth daily.    [provider]  UNABLE TO FIND 1 each by Other route See admin instructions. Med Name: CPAP machine, minimal of 4 hours per night    [provider]      Allergies    Patient has no known allergies.    Review of Systems   Review of Systems  Physical Exam Updated Vital Signs BP (!) 116/90   Pulse (!) 128   Temp (!) 97.2 F (36.2 C) (Axillary)   Resp 16   SpO2 96%  Physical Exam Vitals and nursing note reviewed.  Constitutional:      General: He is not in acute distress.    Appearance: He is well-developed.  HENT:     Head: Normocephalic and atraumatic.     Mouth/Throat:     Mouth: Mucous membranes are moist.  Eyes:     General: Vision grossly intact. Gaze aligned appropriately.     Extraocular Movements: Extraocular movements intact.     Conjunctiva/sclera: Conjunctivae normal.  Cardiovascular:     Rate and Rhythm: Normal rate and regular rhythm.     Pulses: Normal pulses.     Heart sounds: Normal heart sounds, S1 normal and S2 normal. No murmur heard.  No friction rub. No gallop.  Pulmonary:     Effort: Pulmonary effort is normal. No respiratory distress.     Breath sounds: Normal breath sounds.  Abdominal:     Palpations: Abdomen is soft.     Tenderness: There is no abdominal tenderness. There is no guarding or rebound.     Hernia: No hernia is present.  Musculoskeletal:        General: No swelling.     Cervical back: Full passive range of motion without pain, normal range of motion and neck supple. No pain with movement, spinous process tenderness or muscular tenderness. Normal range of motion.     Right lower leg: No edema.     Left lower leg: No edema.  Skin:    General: Skin is warm and dry.     Capillary Refill: Capillary refill takes less than 2 seconds.      Findings: No ecchymosis, erythema, lesion or wound.  Neurological:     GCS: GCS eye subscore is 4. GCS verbal subscore is 5. GCS motor subscore is 6.     Cranial Nerves: Cranial nerves 2-12 are intact.     Sensory: Sensation is intact.     Motor: Motor function is intact. No weakness or abnormal muscle tone.     ED Results / Procedures / Treatments   Labs (all labs ordered are listed, but only abnormal results are displayed) Labs Reviewed  RESP PANEL BY RT-PCR (RSV, FLU A&B, COVID)  RVPGX2 - Abnormal; Notable for the following components:      Result Value   SARS Coronavirus 2 by RT PCR POSITIVE (*)    All other components within normal limits  LACTIC ACID, PLASMA - Abnormal; Notable for the following components:   Lactic Acid, Venous 3.3 (*)    All other components within normal limits  COMPREHENSIVE METABOLIC PANEL - Abnormal; Notable for the following components:   Sodium 146 (*)    Glucose, Bld 118 (*)    BUN 52 (*)    Creatinine, Ser 1.44 (*)    Alkaline Phosphatase 146 (*)    Total Bilirubin 1.3 (*)    GFR, Estimated 45 (*)    Anion gap 17 (*)    All other components within normal limits  URINALYSIS, ROUTINE W REFLEX MICROSCOPIC - Abnormal; Notable for the following components:   Color, Urine AMBER (*)    APPearance TURBID (*)    Glucose, UA   (*)    Value: TEST NOT REPORTED DUE TO COLOR INTERFERENCE OF URINE PIGMENT   Hgb urine dipstick   (*)    Value: TEST NOT REPORTED DUE TO COLOR INTERFERENCE OF URINE PIGMENT   Bilirubin Urine   (*)    Value: TEST NOT REPORTED DUE TO COLOR INTERFERENCE OF URINE PIGMENT   Ketones, ur   (*)    Value: TEST NOT REPORTED DUE TO COLOR INTERFERENCE OF URINE PIGMENT   Protein, ur   (*)    Value: TEST NOT REPORTED DUE TO COLOR INTERFERENCE OF URINE PIGMENT   Nitrite   (*)    Value: TEST NOT REPORTED DUE TO COLOR INTERFERENCE OF URINE PIGMENT   Leukocytes,Ua   (*)    Value: TEST NOT REPORTED DUE TO COLOR INTERFERENCE OF URINE PIGMENT    All other components within normal limits  CBC WITH DIFFERENTIAL/PLATELET - Abnormal; Notable for the following components:   RBC 4.21 (*)    All other components within normal limits  URINALYSIS, MICROSCOPIC (REFLEX) -  Abnormal; Notable for the following components:   Bacteria, UA MANY (*)    All other components within normal limits  CULTURE, BLOOD (ROUTINE X 2)  CULTURE, BLOOD (ROUTINE X 2)  URINE CULTURE  LACTIC ACID, PLASMA  CBC WITH DIFFERENTIAL/PLATELET  PROTIME-INR  APTT    EKG None  Radiology CT HEAD WO CONTRAST (5MM)  Result Date: 06/12/2022 CLINICAL DATA:  Increased confusion EXAM: CT HEAD WITHOUT CONTRAST TECHNIQUE: Contiguous axial images were obtained from the base of the skull through the vertex without intravenous contrast. RADIATION DOSE REDUCTION: This exam was performed according to the departmental dose-optimization program which includes automated exposure control, adjustment of the mA and/or kV according to patient size and/or use of iterative reconstruction technique. COMPARISON:  06/14/2021 FINDINGS: Brain: No evidence of acute infarction, hemorrhage, hydrocephalus, extra-axial collection or mass lesion/mass effect. Chronic atrophic and ischemic changes are noted. Vascular: No hyperdense vessel or unexpected calcification. Skull: Normal. Negative for fracture or focal lesion. Sinuses/Orbits: No acute finding. Other: None. IMPRESSION: Chronic atrophic and ischemic changes are seen. No acute abnormality noted. Electronically Signed   By: Inez Catalina M.D.   On: 06/12/2022 01:55   DG Chest Port 1 View  Result Date: 06/11/2022 CLINICAL DATA:  Sepsis EXAM: PORTABLE CHEST 1 VIEW COMPARISON:  05/28/2022 FINDINGS: The lungs are hyperinflated. No focal airspace consolidation or pulmonary edema. No pleural effusion or pneumothorax. Normal cardiomediastinal contours. Calcific aortic atherosclerosis. IMPRESSION: No acute cardiopulmonary disease. Electronically Signed   By: Ulyses Jarred M.D.   On: 06/11/2022 23:21    Procedures Procedures    Medications Ordered in ED Medications - No data to display  ED Course/ Medical Decision Making/ A&P                           Medical Decision Making Amount and/or Complexity of Data Reviewed Independent Historian: caregiver External Data Reviewed: labs, radiology, ECG and notes. Labs: ordered. Decision-making details documented in ED Course. Radiology: ordered and independent interpretation performed. Decision-making details documented in ED Course. ECG/medicine tests: ordered and independent interpretation performed. Decision-making details documented in ED Course.   Patient presents to the emergency department for evaluation of altered mental status, generalized weakness.  Patient has not been doing well since he was diagnosed with COVID 2 weeks ago.  In the last week, however, he has become more ill.  Family report more confusion than normal.  He has not been eating or drinking.  At arrival he is tachycardic and appears dehydrated.  CT head unremarkable.  Chest x-ray without evidence of pneumonia.  Patient's BUN and creatinine are elevated, supporting his clinical dehydration.  Urinalysis attempted.  Foley catheter was replaced and urine collected was from the new catheter.  The urine he is putting out is brown and purulent.  Reviewing his records reveals only 1 prior urine culture that was Enterococcus.  Family, however, report multiple infections.  His urologist is through [facility].  I did obtain multiple cultures through care everywhere.  It appears that most of his cultures at [facility] in the past year grew Citrobacter that was multidrug-resistant.  This Citrobacter was sensitive to carbapenems, nitrofurantoin, tetracycline and Zosyn.  It was resistant to fluoroquinolones, all cephalosporins, Bactrim, aztreonam, ampicillin and Unasyn.  Patient will be administered Zosyn and will require admission for further  management.  CRITICAL CARE Performed by: Orpah Greek   Total critical care time: 30 minutes  Critical care time was exclusive of separately  billable procedures and treating other patients.  Critical care was necessary to treat or prevent imminent or life-threatening deterioration.  Critical care was time spent personally by me on the following activities: development of treatment plan with patient and/or surrogate as well as nursing, discussions with consultants, evaluation of patient's response to treatment, examination of patient, obtaining history from patient or surrogate, ordering and performing treatments and interventions, ordering and review of laboratory studies, ordering and review of radiographic studies, pulse oximetry and re-evaluation of patient's condition.         Final Clinical Impression(s) / ED Diagnoses Final diagnoses:  [id number]  Urinary tract infection without hematuria, site unspecified  AKI (acute kidney injury) [facility])    Rx / DC Orders ED Discharge Orders     None         Orpah Greek, MD 06/12/22 [phone]

## 2022-06-12 NOTE — ED Notes (Addendum)
Patient is an extremely hard stick unable to get blue top or gold top. MD made aware.

## 2022-06-12 NOTE — Progress Notes (Addendum)
This is a 89y/o male with PMHx of dementia was dx with Covid 2wks ago. Over the last wk he has been declining, not eating or drinking. He has a chronic foley since 2019. Last changed dec 13th. Urine very malodorous and infected. Per report UA too purulent to process. Urine culture 06/14/2021 positive for Enterococcus. Family report multiple infections. His urologist is through [facility] patient with repeat urine culture positive for Citrobacter (2/24, 5/31, 6/22), very resistant to multiple antibiotics but sensitive to zosyn. Patient treated with IV zosyn and 1L IVF. Patient tachycardic but hemodynamically stable.  Patient on Midodrine at baseline.

## 2022-06-12 NOTE — ED Notes (Signed)
The pts blood has not been drawn difficult stick plebotomy could not get blood

## 2022-06-12 NOTE — ED Notes (Signed)
Patient bed cleaned. Patient bed lined with new linen. Patient new foley placed. Patient given warm blankets at this time.

## 2022-06-13 DIAGNOSIS — N390 Urinary tract infection, site not specified: Secondary | ICD-10-CM | POA: Diagnosis not present

## 2022-06-13 LAB — BASIC METABOLIC PANEL
Anion gap: 9 (ref 5–15)
BUN: 41 mg/dL — ABNORMAL HIGH (ref 8–23)
CO2: 25 mmol/L (ref 22–32)
Calcium: 8.7 mg/dL — ABNORMAL LOW (ref 8.9–10.3)
Chloride: 114 mmol/L — ABNORMAL HIGH (ref 98–111)
Creatinine, Ser: 1.33 mg/dL — ABNORMAL HIGH (ref 0.61–1.24)
GFR, Estimated: 50 mL/min — ABNORMAL LOW (ref >60)
Glucose, Bld: 90 mg/dL (ref 70–99)
Potassium: 3.4 mmol/L — ABNORMAL LOW (ref 3.5–5.1)
Sodium: 148 mmol/L — ABNORMAL HIGH (ref 135–145)

## 2022-06-13 LAB — BLOOD CULTURE ID PANEL (REFLEXED) - BCID2

## 2022-06-13 LAB — CBC
HCT: 38.9 % — ABNORMAL LOW (ref 39.0–52.0)
Hemoglobin: 12 g/dL — ABNORMAL LOW (ref 13.0–17.0)
MCH: 30.9 pg (ref 26.0–34.0)
MCHC: 30.8 g/dL (ref 30.0–36.0)
MCV: 100.3 fL — ABNORMAL HIGH (ref 80.0–100.0)
Platelets: 142 10*3/uL — ABNORMAL LOW (ref 150–400)
RBC: 3.88 MIL/uL — ABNORMAL LOW (ref 4.22–5.81)
RDW: 13.7 % (ref 11.5–15.5)
WBC: 6.2 10*3/uL (ref 4.0–10.5)
nRBC: 0 % (ref 0.0–0.2)

## 2022-06-13 LAB — URINE CULTURE: Culture: >=100000 — AB

## 2022-06-13 NOTE — ED Notes (Signed)
Pt in room with daughter at baseline. Pt refusing to open mother for meds. Will attempt again with daughters assistance. Updated daughter on plan of care. Catheter assessed and draining dark yellow urine. Denies any other needs at this time.

## 2022-06-13 NOTE — ED Notes (Addendum)
Pt swallowed pills with apple sauce and coaching.

## 2022-06-13 NOTE — ED Notes (Signed)
Pt coached with pills again and apple sauce. Pt tolerated well
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
## 2022-06-13 NOTE — Progress Notes (Signed)
PHARMACY - PHYSICIAN COMMUNICATION CRITICAL VALUE ALERT - BLOOD CULTURE IDENTIFICATION (BCID)  [name] is an 89 y.o. male who presented to [facility] on 06/11/2022 with a chief complaint of AMS/UTI  Assessment:   1/2 blood cultures growing Staphylococcus species.   Name of physician (or Provider) Contacted:  Dr. Nevada Crane  Current antibiotics: Zosyn  Changes to prescribed antibiotics recommended:  None at this time   Results for orders placed or performed during the hospital encounter of 06/11/22  Blood Culture ID Panel (Reflexed) (Collected: 06/11/2022 11:04 PM)  Result Value Ref Range   Enterococcus faecalis NOT DETECTED NOT DETECTED   Enterococcus Faecium NOT DETECTED NOT DETECTED   Listeria monocytogenes NOT DETECTED NOT DETECTED   Staphylococcus species DETECTED (A) NOT DETECTED   Staphylococcus aureus (BCID) NOT DETECTED NOT DETECTED   Staphylococcus epidermidis NOT DETECTED NOT DETECTED   Staphylococcus lugdunensis NOT DETECTED NOT DETECTED   Streptococcus species NOT DETECTED NOT DETECTED   Streptococcus agalactiae NOT DETECTED NOT DETECTED   Streptococcus pneumoniae NOT DETECTED NOT DETECTED   Streptococcus pyogenes NOT DETECTED NOT DETECTED   A.calcoaceticus-baumannii NOT DETECTED NOT DETECTED   Bacteroides fragilis NOT DETECTED NOT DETECTED   Enterobacterales NOT DETECTED NOT DETECTED   Enterobacter cloacae complex NOT DETECTED NOT DETECTED   Escherichia coli NOT DETECTED NOT DETECTED   Klebsiella aerogenes NOT DETECTED NOT DETECTED   Klebsiella oxytoca NOT DETECTED NOT DETECTED   Klebsiella pneumoniae NOT DETECTED NOT DETECTED   Proteus species NOT DETECTED NOT DETECTED   Salmonella species NOT DETECTED NOT DETECTED   Serratia marcescens NOT DETECTED NOT DETECTED   Haemophilus influenzae NOT DETECTED NOT DETECTED   Neisseria meningitidis NOT DETECTED NOT DETECTED   Pseudomonas aeruginosa NOT DETECTED NOT DETECTED   Stenotrophomonas maltophilia NOT DETECTED  NOT DETECTED   Candida albicans NOT DETECTED NOT DETECTED   Candida auris NOT DETECTED NOT DETECTED   Candida glabrata NOT DETECTED NOT DETECTED   Candida krusei NOT DETECTED NOT DETECTED   Candida parapsilosis NOT DETECTED NOT DETECTED   Candida tropicalis NOT DETECTED NOT DETECTED   Cryptococcus neoformans/gattii NOT DETECTED NOT DETECTED    Caryl Pina 06/13/2022  5:36 AM

## 2022-06-13 NOTE — Evaluation (Signed)
Physical Therapy Evaluation Patient Details Name: [name] MRN: [id number] DOB: 19-Dec-1925 Today's Date: 06/13/2022  History of Present Illness  Pt is 89 yo male admitted 78/58/85 with complicated UTI.  Pt with recent COVID 19 (05/28/22).  Pt with hx including but not limited to hypotension on midodrine, advanced dementia, OSA, BPH s/p TURP with chronic indwelling foley.  Clinical Impression  Pt admitted with above diagnosis.  Pt with advanced dementia but at baseline ambulates from room to room in house with assist.  Pt occasionally uses RW with assist but more often just holds onto wife.  He does have PCA 4-5 days a week for a couple hours but wife is main caregiver.  Today, pt lethargic but alertness improved at EOB (although he kept eyes closed, daughter reports that is fairly typical).  He required max A to EOB, but then balanced with min guard, and then light mod A of 2 to stand.  Pt required multimodal cues, increased time, and responded well to daughter.  Daughter reports they have tried SNF in past and does not do well due to dementia.  She states there are multiple family members that can possibly assist and prefers home with HHPT.  PT agrees with dementia, home is likely best option if able to arrange for assist. Pt currently with functional limitations due to the deficits listed below (see PT Problem List). Pt will benefit from skilled PT to increase their independence and safety with mobility to allow discharge to the venue listed below.         Recommendations for follow up therapy are one component of a multi-disciplinary discharge planning process, led by the attending physician.  Recommendations may be updated based on patient status, additional functional criteria and insurance authorization.  Follow Up Recommendations Home health PT (if family able to arrange increased assist; if not may need SNF)      Assistance Recommended at Discharge Frequent or constant  Supervision/Assistance  Patient can return home with the following  A lot of help with walking and/or transfers;A lot of help with bathing/dressing/bathroom;Assistance with feeding;Assistance with cooking/housework;Help with stairs or ramp for entrance    Equipment Recommendations Hospital bed  Recommendations for Other Services       Functional Status Assessment Patient has had a recent decline in their functional status and demonstrates the ability to make significant improvements in function in a reasonable and predictable amount of time.     Precautions / Restrictions Precautions Precautions: Fall      Mobility  Bed Mobility Overal bed mobility: Needs Assistance Bed Mobility: Supine to Sit, Sit to Supine     Supine to sit: Max assist Sit to supine: Max assist   General bed mobility comments: Increased time, use of HOB elevated, max A for legs and trunk but pt was activating trunk once transition initated    Transfers Overall transfer level: Needs assistance Equipment used: 2 person hand held assist Transfers: Sit to/from Stand Sit to Stand: Mod assist, +2 physical assistance           General transfer comment: Light mod A of 2 (daughter assisting).  Pt stood at EOB with assist to initiate.    Ambulation/Gait                  Stairs            Wheelchair Mobility    Modified Rankin (Stroke Patients Only)       Balance Overall balance assessment: Needs assistance  Sitting-balance support: Bilateral upper extremity supported Sitting balance-Leahy Scale: Poor Sitting balance - Comments: Leaning forward requirng min guard and UE support; EOB for at least 10 mins encouraging sitting upright and pt taking meds while sitting with assist from daughter   Standing balance support: Bilateral upper extremity supported Standing balance-Leahy Scale: Poor Standing balance comment: Requirng min-mod A for standing balance. Able to balance with assist of 1  once standing but tends to sink down requiring cues and assist to tuck buttock and lift head.  Standing 20-30 seconds                             Pertinent Vitals/Pain Pain Assessment Pain Assessment: Faces Faces Pain Scale: Hurts a little bit Pain Location: Generalized; grimaced with movement Pain Descriptors / Indicators: Grimacing Pain Intervention(s): Limited activity within patient's tolerance, Monitored during session    Home Living Family/patient expects to be discharged to:: Private residence Living Arrangements: Spouse/significant other Available Help at Discharge: Family;Available 24 hours/day;Other (Comment) (Wife is primary caregiver; do have aide but only couple days a week) Type of Home: House Home Access: Stairs to enter Entrance Stairs-Rails: Psychiatric nurse of Steps: 3   Home Layout: One level Home Equipment: Tub bench;BSC/3in1;Rolling Walker (2 wheels);Cane - single point;Transport chair Additional Comments: daughter reports multiple family members can assist if needed    Prior Function               Mobility Comments: Ambulates short distances in house.  Most often just holds onto wife but occasionally uses RW ADLs Comments: Assist for all ADLs; Aide at least 4 days a week     Hand Dominance        Extremity/Trunk Assessment   Upper Extremity Assessment Upper Extremity Assessment: RUE deficits/detail;LUE deficits/detail;Difficult to assess due to impaired cognition RUE Deficits / Details: ROM: shoulder elevation to ~100 degrees; MMT: not following commands, does demonstrate some AROM throughout but <3/5 LUE Deficits / Details: ROM: shoulder elevation to ~100 degrees; MMT: not following commands, does demonstrate some AROM throughout but <3/5    Lower Extremity Assessment Lower Extremity Assessment: Difficult to assess due to impaired cognition;RLE deficits/detail;LLE deficits/detail RLE Deficits / Details: ROM: WFL:  MMT: not following commands but does demonstrate some AROM throughout but <3/5, did push therapist away with heel slide with cues and increased time LLE Deficits / Details: ROM: WFL: MMT: not following commands but does demonstrate some AROM throughout but <3/5, did push therapist away with heel slide with cues and increased time    Cervical / Trunk Assessment Cervical / Trunk Assessment: Other exceptions Cervical / Trunk Exceptions: Pt prefers keep head turned L (sleeps L side fetal position)- neck stiff with transition to midline  Communication      Cognition Arousal/Alertness: Lethargic Behavior During Therapy: Flat affect Overall Cognitive Status: History of cognitive impairments - at baseline                                 General Comments: Pt with advanced dementia.  Daughter present.  Reports pt keeps his eyes closed most of the time.  Pt initially not responding to therapist but once EOB began following some multimodal commands.  Pt's daughter present and helpful - pt reponded better to daughter than therapist.        General Comments      Exercises General Exercises - Upper  Extremity Shoulder Flexion: AAROM, Both, 5 reps, Supine Elbow Flexion: AAROM, Both, 5 reps, Supine Elbow Extension: AAROM, Both, 5 reps, Supine General Exercises - Lower Extremity Heel Slides: AAROM, Both, 5 reps, Supine   Assessment/Plan    PT Assessment Patient needs continued PT services  PT Problem List Decreased strength;Decreased cognition;Decreased range of motion;Decreased knowledge of use of DME;Decreased activity tolerance;Decreased safety awareness;Decreased balance;Decreased mobility;Decreased coordination;Decreased knowledge of precautions       PT Treatment Interventions DME instruction;Balance training;Gait training;Cognitive remediation;Patient/family education;Functional mobility training;Therapeutic activities;Therapeutic exercise;Wheelchair mobility training    PT  Goals (Current goals can be found in the Care Plan section)  Acute Rehab PT Goals Patient Stated Goal: Daugter reports prefer home due to dementia.  States they have tried rehab before and does not work.  Reports multiple family members can assist pt's wife.  Does want HHPT PT Goal Formulation: With patient/family Time For Goal Achievement: 06/27/22 Potential to Achieve Goals: Fair    Frequency Min 3X/week     Co-evaluation               AM-PAC PT "6 Clicks" Mobility  Outcome Measure Help needed turning from your back to your side while in a flat bed without using bedrails?: A Lot Help needed moving from lying on your back to sitting on the side of a flat bed without using bedrails?: A Lot Help needed moving to and from a bed to a chair (including a wheelchair)?: Total Help needed standing up from a chair using your arms (e.g., wheelchair or bedside chair)?: Total Help needed to walk in hospital room?: Total Help needed climbing 3-5 steps with a railing? : Total 6 Click Score: 8    End of Session Equipment Utilized During Treatment: Gait belt Activity Tolerance: Patient limited by lethargy Patient left: in bed;with call bell/phone within reach;with family/visitor present (ED on stretcher) Nurse Communication: Mobility status PT Visit Diagnosis: Other abnormalities of gait and mobility (R26.89);Muscle weakness (generalized) (M62.81)    Time: 1027-2536 PT Time Calculation (min) (ACUTE ONLY): 38 min   Charges:   PT Evaluation $PT Eval Low Complexity: 1 Low PT Treatments $Therapeutic Exercise: 8-22 mins $Therapeutic Activity: 8-22 mins        Abran Richard, PT Acute Rehab [facility] [phone]   Karlton Lemon 06/13/2022, 2:30 PM
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
## 2022-06-14 DIAGNOSIS — N390 Urinary tract infection, site not specified: Secondary | ICD-10-CM | POA: Diagnosis not present

## 2022-06-14 LAB — CULTURE, BLOOD (ROUTINE X 2)

## 2022-06-14 MED ORDER — CHLORHEXIDINE GLUCONATE CLOTH 2 % EX PADS
6.0000 | MEDICATED_PAD | Freq: Every day | CUTANEOUS | Status: DC
  Administered 2022-06-14: 6 via TOPICAL

## 2022-06-14 MED ORDER — ACETAMINOPHEN 160 MG/5ML PO SOLN
650.0000 mg | Freq: Four times a day (QID) | ORAL | Status: DC | PRN
  Administered 2022-06-14 – 2022-06-15 (×2): 650 mg via ORAL
  Filled 2022-06-14 (×2): qty 20.3

## 2022-06-14 MED ORDER — ACETAMINOPHEN 650 MG RE SUPP: 650.0000 mg | Freq: Four times a day (QID) | RECTAL | Status: DC | PRN

## 2022-06-14 NOTE — TOC Initial Note (Addendum)
Transition of Care [name]) - Initial/Assessment Note    Patient Details  Name: [name] MRN: [id number] Date of Birth: 04-Aug-1925  Transition of Care [name]) CM/SW Contact:    Dave Bender, RN Phone Number: 06/14/2022, 1:27 PM  Clinical Narrative:                 Spoke to wife, [name], regarding transition needs. [name] requested this NCM reach out to [name] to arranged [name]. [name] with [facility] declined referral due to the patient maxing out his home health benefit. [name] states the patient has all needed DME and that family assist with his care. This NCM will put wheelchair [name] transportation information on the AVS. Address, Phone number and PCP verified.  Patient needs [facility] transportation at discharge.   Expected Discharge Plan: Home/Self Care     Patient Goals and CMS Choice Patient states their goals for this hospitalization and ongoing recovery are:: wife wants patient to return home          Expected Discharge Plan and Services   Discharge Planning Services: CM Consult   Living arrangements for the past 2 months: Single Family Home                                      Prior Living Arrangements/Services Living arrangements for the past 2 months: Single Family Home Lives with:: Adult Children Patient language and need for interpreter reviewed:: Yes        Need for Family Participation in Patient Care: Yes (Comment) Care giver support system in place?: Yes (comment) Current home services: DME (bed, WC, walker, hoyer lift, transport chair) Criminal Activity/Legal Involvement Pertinent to Current Situation/Hospitalization: No - Comment as needed  Activities of Daily Living Home Assistive Devices/Equipment: Dentures (specify type) ADL Screening (condition at time of admission) Patient's cognitive ability adequate to safely complete daily activities?: No Is the patient deaf or have difficulty hearing?: Yes Does the patient have  difficulty seeing, even when wearing glasses/contacts?: Yes Does the patient have difficulty concentrating, remembering, or making decisions?: Yes Patient able to express need for assistance with ADLs?: Yes Does the patient have difficulty dressing or bathing?: Yes Independently performs ADLs?: No Communication: Independent Dressing (OT): Needs assistance Is this a change from baseline?: Pre-admission baseline Grooming: Needs assistance Is this a change from baseline?: Pre-admission baseline Feeding: Needs assistance Is this a change from baseline?: Pre-admission baseline Bathing: Needs assistance Is this a change from baseline?: Pre-admission baseline Toileting: Needs assistance Is this a change from baseline?: Pre-admission baseline In/Out Bed: Needs assistance Is this a change from baseline?: Pre-admission baseline Walks in Home: Needs assistance Is this a change from baseline?: Pre-admission baseline Does the patient have difficulty walking or climbing stairs?: Yes Weakness of Legs: Both Weakness of Arms/Hands: Both  Permission Sought/Granted                  Emotional Assessment         Alcohol / Substance Use: Not Applicable Psych Involvement: No (comment)  Admission diagnosis:  AKI (acute kidney injury) ([place]) [N17.9] Urinary tract infection without hematuria, site unspecified [N39.0] Acute COVID-19 [U07.1] COVID-19 [U07.1] Patient Active Problem List   Diagnosis Date Noted   COVID-19 06/12/2022   Chronic indwelling Foley catheter 06/12/2022   Paraphimosis 06/12/2022   Lactic acidosis 06/12/2022   Renal insufficiency 06/12/2022   Hypernatremia 06/12/2022   Dehydration 06/12/2022   Elevated  alkaline phosphatase level 06/12/2022   Pressure ulcer of sacral region 06/12/2022   Hypotension 06/12/2022   Postoperative anemia due to acute blood loss 06/22/2021   Dysphagia 06/20/2021   DNR (do not resuscitate) 06/20/2021   Protein-calorie malnutrition, severe  06/16/2021   Fracture of femoral neck, left, closed ([place]) 06/14/2021   Sleep apnea    Left varicocele    Impaired gait and mobility 25/10/3974   Complicated urinary tract infection    RBBB (right bundle branch block)    Pseudodementia    Obesity    Internal hemorrhoids    Hx of adenomatous colonic polyps    Gram negative sepsis ([place])    Dementia with behavioral disturbance (H[facility])    Cataract    Gallbladder polyp    OSA on CPAP 04/29/2020   Daytime somnolence 12/25/2019   Witnessed apneic spells 12/25/2019   Persistent cough 12/25/2019   Snoring 12/25/2019   Acute encephalopathy 11/28/2019   Fall at home, initial encounter 02/19/2019   Other reduced mobility 11/28/2018   Urinary retention 04/10/2018   Liver cyst 09/21/2017   Stage 3 chronic kidney disease ([place]) 09/21/2017   Syncope 09/19/2017   Reduced vision 01/30/2017   Seasonal allergic rhinitis 01/30/2017   Acute right flank pain 01/04/2017   Pulmonary infiltrates on CXR 04/04/2016   Cough 04/03/2016   Senile dementia ([place]) 09/29/2015   Health care maintenance 04/28/2015   Arthralgia of hip 09/14/2012   Weight loss 05/31/2012   Headache(784.0) 12/29/2011   Hyperlipidemia 12/26/2011   Essential hypertension 12/26/2011   Shoulder pain 07/10/2011   Insomnia 03/29/2011   B12 deficiency 07/06/2010   HEMORRHOIDS 06/14/2010   NONSPECIFIC ABN FINDNG RAD&OTH EXAM BILARY TRCT 06/14/2010   COLONIC POLYPS, ADENOMATOUS, HX OF 06/03/2010   FATIQUE AND MALAISE 02/03/2010   UNSPECIFIED HEARING LOSS 11/20/2008   COLONIC POLYPS 06/11/2007   PSEUDODEMENTIA 06/11/2007   Unspecified glaucoma 06/11/2007   BUNDLE BRANCH BLOCK, RIGHT 06/11/2007   VARICOCELE 06/11/2007   Diverticulosis of large intestine 06/11/2007   Benign prostatic hyperplasia with lower urinary tract symptoms 06/11/2007   PCP:  Dave Chandler, NP Pharmacy:   [id number] [place], [place] - [id number]: [phone]: [phone] [id number] [address] [place]: [phone]: [address] [id number]: [phone]: [phone]   Social Determinants of Health (SDOH) Social History: [place] Insecurity (06/13/2022)  Housing: Low Risk  (06/13/2022)  Transportation Needs: No Transportation Needs (06/13/2022)  Utilities: Not At Risk (06/13/2022)  Alcohol Screen: Low Risk  (01/15/2018)  Depression (PHQ2-9): Low Risk  (04/10/2022)  Financial Resource Strain: Low Risk  (02/28/2018)  Physical Activity: Insufficiently Active (02/28/2018)  Social Connections: Moderately Integrated (02/28/2018)  Stress: No Stress Concern Present (02/28/2018)  Tobacco Use: Medium Risk (06/12/2022)   SDOH Interventions:     Readmission Risk Interventions     No data to display
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
## 2022-06-14 NOTE — Plan of Care (Signed)
  Problem: Respiratory: Goal: Will maintain a patent airway Outcome: Progressing   Problem: Pain Managment: Goal: General experience of comfort will improve Outcome: Progressing

## 2022-06-15 ENCOUNTER — Other Ambulatory Visit (HOSPITAL_COMMUNITY): Payer: Self-pay

## 2022-06-15 DIAGNOSIS — N390 Urinary tract infection, site not specified: Secondary | ICD-10-CM | POA: Diagnosis not present

## 2022-06-15 LAB — COMPREHENSIVE METABOLIC PANEL
ALT: 12 U/L (ref 0–44)
AST: 22 U/L (ref 15–41)
Albumin: 2.8 g/dL — ABNORMAL LOW (ref 3.5–5.0)
Alkaline Phosphatase: 106 U/L (ref 38–126)
Anion gap: 9 (ref 5–15)
BUN: 32 mg/dL — ABNORMAL HIGH (ref 8–23)
CO2: 27 mmol/L (ref 22–32)
Calcium: 9.2 mg/dL (ref 8.9–10.3)
Chloride: 113 mmol/L — ABNORMAL HIGH (ref 98–111)
Creatinine, Ser: 1.17 mg/dL (ref 0.61–1.24)
GFR, Estimated: 58 mL/min — ABNORMAL LOW (ref >60)
Glucose, Bld: 90 mg/dL (ref 70–99)
Potassium: 3.4 mmol/L — ABNORMAL LOW (ref 3.5–5.1)
Sodium: 149 mmol/L — ABNORMAL HIGH (ref 135–145)
Total Bilirubin: 0.7 mg/dL (ref 0.3–1.2)
Total Protein: 6.2 g/dL — ABNORMAL LOW (ref 6.5–8.1)

## 2022-06-15 LAB — CBC WITH DIFFERENTIAL/PLATELET
Abs Immature Granulocytes: 0.02 10*3/uL (ref 0.00–0.07)
Basophils Absolute: 0 10*3/uL (ref 0.0–0.1)
Basophils Relative: 0 %
Eosinophils Absolute: 0.1 10*3/uL (ref 0.0–0.5)
Eosinophils Relative: 1 %
HCT: 38 % — ABNORMAL LOW (ref 39.0–52.0)
Hemoglobin: 12.3 g/dL — ABNORMAL LOW (ref 13.0–17.0)
Immature Granulocytes: 0 %
Lymphocytes Relative: 17 %
Lymphs Abs: 1 10*3/uL (ref 0.7–4.0)
MCH: 31.1 pg (ref 26.0–34.0)
MCHC: 32.4 g/dL (ref 30.0–36.0)
MCV: 96 fL (ref 80.0–100.0)
Monocytes Absolute: 0.6 10*3/uL (ref 0.1–1.0)
Monocytes Relative: 10 %
Neutro Abs: 4.2 10*3/uL (ref 1.7–7.7)
Neutrophils Relative %: 72 %
Platelets: 166 10*3/uL (ref 150–400)
RBC: 3.96 MIL/uL — ABNORMAL LOW (ref 4.22–5.81)
RDW: 13.5 % (ref 11.5–15.5)
WBC: 5.9 10*3/uL (ref 4.0–10.5)
nRBC: 0 % (ref 0.0–0.2)

## 2022-06-15 LAB — LACTIC ACID, PLASMA: Lactic Acid, Venous: 1.1 mmol/L (ref 0.5–1.9)

## 2022-06-15 MED ORDER — POTASSIUM CHLORIDE 20 MEQ PO PACK
40.0000 meq | PACK | Freq: Once | ORAL | Status: AC
  Administered 2022-06-15: 40 meq via ORAL
  Filled 2022-06-15: qty 2

## 2022-06-15 MED ORDER — FOSFOMYCIN TROMETHAMINE 3 G PO PACK
3.0000 g | PACK | Freq: Once | ORAL | Status: AC
  Administered 2022-06-15: 3 g via ORAL
  Filled 2022-06-15: qty 3

## 2022-06-15 MED ORDER — LEVOFLOXACIN 750 MG PO TABS
750.0000 mg | ORAL_TABLET | ORAL | 0 refills | Status: DC
  Filled 2022-06-15: qty 3, 6d supply, fill #0

## 2022-06-15 NOTE — Progress Notes (Signed)
Nursing Dc note  Dc intructions reviewed with Adrianne, daughter, verbalized understanding. Ccmd notified of dc order. Toc med given to daughter.patient awaiting medical Lucianne Lei transport to take him home.

## 2022-06-15 NOTE — Progress Notes (Signed)
PT Cancellation Note  Patient Details Name: [name] MRN: [id number] DOB: 06-24-1925   Cancelled Treatment:     Pt scheduled for D/C to home this PM.  Dtr requests no treatment at this time.  Pt asleep during the 2 attempts to initiate therapy and remains asleep.   Ladoris Gene 06/15/2022, 12:36 PM

## 2022-06-15 NOTE — Care Management (Signed)
Called [facility] they estimate it will be between 2-3p for pick up

## 2022-06-15 NOTE — TOC Transition Note (Signed)
Transition of Care [facility]) - CM/SW Discharge Note   Patient Details  Name: [name] MRN: [id number] Date of Birth: 1926/06/01  [facility] CM/SW Contact:  Verdell Carmine, RN Phone Number: 06/15/2022, 12:30 PM   Clinical Narrative:     Patient to be DC today with [facility]. Patient is out of [place] days Has PCS services at home. Lives with wife and daughters  Final next level of care: Home/Self Care (has PCS services) Barriers to Discharge: No Barriers Identified   Patient Goals and CMS Choice      Discharge Placement                 DC home        Discharge Plan and Services Additional resources added to the After Visit Summary for     Discharge Planning Services: CM Consult                                 Social Determinants of Health (SDOH) Interventions SDOH Screenings   Food Insecurity: No Food Insecurity (06/13/2022)  Housing: Low Risk  (06/13/2022)  Transportation Needs: No Transportation Needs (06/13/2022)  Utilities: Not At Risk (06/13/2022)  Alcohol Screen: Low Risk  (01/15/2018)  Depression (PHQ2-9): Low Risk  (04/10/2022)  Financial Resource Strain: Low Risk  (02/28/2018)  Physical Activity: Insufficiently Active (02/28/2018)  Social Connections: Moderately Integrated (02/28/2018)  Stress: No Stress Concern Present (02/28/2018)  Tobacco Use: Medium Risk (06/12/2022)     Readmission Risk Interventions     No data to display

## 2022-06-15 NOTE — Care Management Important Message (Signed)
Important Message  Patient Details  Name: [name] MRN: [id number] Date of Birth: [birth year]   Medicare Important Message Given:     Patient has a Sales executive order in Place. Called patient room spoke with Daughter in room and  advise me to   Will mail the copy to the patient home address.   Reynoldo Mainer 06/15/2022, 3:46 PM

## 2022-06-15 NOTE — Discharge Summary (Addendum)
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 Performed at [facility], [address], [place], [place] [address]  Blood Culture (routine x 2)     Status: None (Preliminary result)   Collection Time: 06/11/22 11:09 PM   Specimen: BLOOD  Result Value Ref Range Status   Specimen Description BLOOD BLOOD LEFT ARM  Final   Special Requests   Final    BOTTLES DRAWN AEROBIC ONLY Blood Culture results may not be optimal due to an inadequate volume of blood received in culture bottles   Culture   Final    NO GROWTH 3 DAYS Performed at [facility], [place] [address] [place], [place] [address]    Report  Status PENDING  Incomplete  Resp panel by RT-PCR (RSV, Flu A&B, Covid) Anterior Nasal Swab     Status: Abnormal   Collection Time: 06/12/22  2:18 AM   Specimen: Anterior Nasal Swab  Result Value Ref Range Status   SARS Coronavirus 2 by RT PCR POSITIVE (A) NEGATIVE Final    Comment: (NOTE) SARS-CoV-2 target nucleic acids are DETECTED.  The SARS-CoV-2 RNA is generally detectable in upper respiratory specimens during the acute phase of infection. Positive results are indicative of the presence of the identified virus, but do not rule out bacterial infection or co-infection with other pathogens not detected by the test. Clinical correlation with patient history and other diagnostic information is necessary to determine patient infection status. The expected result is Negative.  Fact Sheet for Patients: EntrepreneurPulse.com.au  Fact Sheet for Healthcare Providers: IncredibleEmployment.be  This test is not yet approved or cleared by the [place] FDA and  has been authorized for detection and/or diagnosis of SARS-CoV-2 by FDA under an Emergency Use Authorization (EUA).  This EUA will remain in effect (meaning this test can be used) for the duration of  the COVID-19 declaration under Section 564(b)(1) of the A ct, 21 U.S.C. section 360bbb-3(b)(1), unless the authorization is terminated or revoked sooner.     Influenza A by PCR NEGATIVE NEGATIVE Final   Influenza B by PCR NEGATIVE NEGATIVE Final    Comment: (NOTE) The Xpert Xpress SARS-CoV-2/FLU/RSV plus assay is intended as an aid in the diagnosis of influenza from Nasopharyngeal swab specimens and should not be used as a sole basis for treatment. Nasal washings and aspirates are unacceptable for Xpert Xpress SARS-CoV-2/FLU/RSV testing.  Fact Sheet for Patients: EntrepreneurPulse.com.au  Fact Sheet for Healthcare Providers: IncredibleEmployment.be  This  test is not yet approved or cleared by the [place] FDA and has been authorized for detection and/or diagnosis of SARS-CoV-2 by FDA under an Emergency Use Authorization (EUA). This EUA will remain in effect (meaning this test can be used) for the duration of the COVID-19 declaration under Section 564(b)(1) of the Act,  21 U.S.C. section 360bbb-3(b)(1), unless the authorization is terminated or revoked.     Resp Syncytial Virus by PCR NEGATIVE NEGATIVE Final    Comment: (NOTE) Fact Sheet for Patients: EntrepreneurPulse.com.au  Fact Sheet for Healthcare Providers: IncredibleEmployment.be  This test is not yet approved or cleared by the [place] FDA and has been authorized for detection and/or diagnosis of SARS-CoV-2 by FDA under an Emergency Use Authorization (EUA). This EUA will remain in effect (meaning this test can be used) for the duration of the COVID-19 declaration under Section 564(b)(1) of the Act, 21 U.S.C. section 360bbb-3(b)(1), unless the authorization is terminated or revoked.  Performed at [facility], [place] [address], [place], [place] [address]   Urine Culture     Status: Abnormal   Collection Time: 06/12/22  5:11 AM   Specimen: In/Out Cath Urine  Result Value Ref Range Status   Specimen Description IN/OUT CATH URINE  Final   Special Requests   Final    NONE Performed at [facility], [place] [address] [place],  [address]    Culture (A)  Final    >=100,000 COLONIES/mL MULTIPLE SPECIES PRESENT, SUGGEST RECOLLECTION   Report Status 06/13/2022 FINAL  Final     Time coordinating discharge: Over 30 minutes  SIGNED:   Darliss Cheney, MD  [facility] 06/15/2022, 1:06 PM *Please note that this is a verbal dictation therefore any spelling or grammatical errors are due to the "[employer] [place] [employer]" system interpretation. If 7PM-7AM, please contact night-coverage [email or web address]

## 2022-06-16 ENCOUNTER — Telehealth: Payer: Self-pay

## 2022-06-16 ENCOUNTER — Telehealth: Payer: Self-pay | Admitting: *Deleted

## 2022-06-16 ENCOUNTER — Ambulatory Visit: Payer: Medicare Other | Admitting: Adult Health

## 2022-06-16 NOTE — Progress Notes (Signed)
  Care Coordination  Note  06/16/2022 Name: QUADRY KAMPA MRN: [id number] DOB: May 25, 1926  TALMADGE GANAS is a 89 y.o. year old primary care patient of [name], NP. I reached out to Carron Curie by phone today to assist with scheduling a follow up appointment. Carron Curie verbally consented to my assistance.       Follow up plan: Hospital Follow Up appointment scheduled with Dewaine Oats, [name], NP) on (06/19/22) at (2:20 PM).  [place]  Direct Dial: [phone]

## 2022-06-16 NOTE — Patient Outreach (Signed)
  Care Coordination TOC Note Transition Care Management Follow-up Telephone Call Date of discharge and from where: 06/15/22-[facility]  Dx: "COVID-19 infection" How have you been since you were released from the hospital? Call completed with spouse. She voices that patient is still asleep and has been resting a lot since returning home. She denies any acute issues or concerns. She voices that patient is not eating much-just drinking.  Any questions or concerns? No  Items Reviewed: Did the pt receive and understand the discharge instructions provided? Yes  Medications obtained and verified? Yes -Spouse inquiring if patient should continue to take powder medicine that mixed with water. She is unsure what med is for as she voices her daughter helps her mange patient's meds. Med not listed on d/c med list. Spouse will discuss with daughter Other? Yes  Any new allergies since your discharge? No  Dietary orders reviewed? Yes Do you have support at home? Yes -wife and adult children  Home Care and Equipment/Supplies: Were home health services ordered? not applicable If so, what is the name of the agency? N/A  Has the agency set up a time to come to the patient's home? not applicable Were any new equipment or medical supplies ordered?  No What is the name of the medical supply agency? N/A Were you able to get the supplies/equipment? not applicable Do you have any questions related to the use of the equipment or supplies? No  Functional Questionnaire: (I = Independent and D = Dependent) ADLs: A  Bathing/Dressing- A  Meal Prep- A  Eating- I  Maintaining continence- A  Transferring/Ambulation- A  Managing Meds- A  Follow up appointments reviewed:  PCP Hospital f/u appt confirmed? Caregiver agreeable to care guide calling to assist with scheduling. [facility] f/u appt confirmed?  N/A  . Are transportation arrangements needed? No  If their condition worsens, is the pt aware  to call PCP or go to the Emergency Dept.? Yes Was the patient provided with contact information for the PCP's office or ED? Yes Was to pt encouraged to call back with questions or concerns? Yes  SDOH assessments and interventions completed:   Yes SDOH Interventions Today    Flowsheet Row Most Recent Value  SDOH Interventions   Food Insecurity Interventions Intervention Not Indicated  Transportation Interventions Intervention Not Indicated       Care Coordination Interventions:  PCP follow up appointment requested Education provided    Encounter Outcome:  Pt. Visit Completed    Enzo Montgomery, RN,BSN,CCM [facility] Telephonic Care Management Coordinator Direct Phone: [phone] Toll Free: [phone] Fax: [phone]

## 2022-06-16 NOTE — Patient Outreach (Signed)
  Care Coordination [facility] Note Transition Care Management Follow-up Telephone Call    RN CM received call from care guide scheduling follow up appt for patient with concerns that patient not doing well per spouse report. RN CM called back to the home and spoke with spouse. Spouse confirms that there has been no changes in patient condition since speaking with RN earlier today. She voices that patient is at his baseline and due to advanced dementia sleeps most of the time and does not really interact/engage with others. She states he was able to get him to drink some grape juice and take meds. He nodded head no when she tried to offer him some grits. Discussed upcoming appt on Monday. Wife reports that son will be able to assist her with getting patient there. Wife reports that daughter-Adrienne who normally assist her is out of town. RN CM called other daughter Levada Dy. Spoke with daughter to verify that patient is stable and at his baseline. She confirms that patient has dementia-sleeps a lot and has decreased appetite. She voices that her brother helps get patient to his appts and will be able to get him there to appt on Monday. No further needs or concerns at this time. Advised to call back if needed.   Care Coordination Interventions:  Education provided    Encounter Outcome:  Pt. Visit Completed    Enzo Montgomery, RN,BSN,CCM [facility] Telephonic Care Management Coordinator Direct Phone: [phone] Toll Free: [phone] Fax: [phone]

## 2022-06-16 NOTE — Telephone Encounter (Signed)
Transition Care Management Follow-up Telephone Call Date of discharge and from where: [facility] 06/15/2022 How have you been since you were released from the hospital? better Any questions or concerns? No  Items Reviewed: Did the pt receive and understand the discharge instructions provided? Yes  Medications obtained and verified? Yes  Other? No  Any new allergies since your discharge? No  Dietary orders reviewed? Yes Do you have support at home? Yes   Home Care and Equipment/Supplies: Were home health services ordered? no If so, what is the name of the agency? N/A  Has the agency set up a time to come to the patient's home? not applicable Were any new equipment or medical supplies ordered?  No What is the name of the medical supply agency? N/a Were you able to get the supplies/equipment? not applicable Do you have any questions related to the use of the equipment or supplies? No  Functional Questionnaire: (I = Independent and D = Dependent) ADLs: D  Bathing/Dressing- D  Meal Prep- D  Eating- D  Maintaining continence- D  Transferring/Ambulation- D  Managing Meds- D  Follow up appointments reviewed:  PCP Hospital f/u appt confirmed? No  Sent message to staff to schedule [facility] Hospital f/u appt confirmed? No  Are transportation arrangements needed? No  If their condition worsens, is the pt aware to call PCP or go to the Emergency Dept.? Yes Was the patient provided with contact information for the PCP's office or ED? Yes Was to pt encouraged to call back with questions or concerns? Yes Juanda Crumble, LPN [facility] Direct Dial [phone]

## 2022-06-18 LAB — CULTURE, BLOOD (ROUTINE X 2): Culture: NO GROWTH

## 2022-06-19 ENCOUNTER — Encounter: Payer: Self-pay | Admitting: Nurse Practitioner

## 2022-06-19 ENCOUNTER — Ambulatory Visit: Payer: Medicare Other | Admitting: Nurse Practitioner

## 2022-06-19 VITALS — BP 122/70 | HR 88 | Temp 97.3°F

## 2022-06-19 DIAGNOSIS — F02C18 Dementia in other diseases classified elsewhere, severe, with other behavioral disturbance: Secondary | ICD-10-CM

## 2022-06-19 DIAGNOSIS — U071 COVID-19: Secondary | ICD-10-CM

## 2022-06-19 DIAGNOSIS — E43 Unspecified severe protein-calorie malnutrition: Secondary | ICD-10-CM

## 2022-06-19 DIAGNOSIS — L89152 Pressure ulcer of sacral region, stage 2: Secondary | ICD-10-CM

## 2022-06-19 DIAGNOSIS — G301 Alzheimer's disease with late onset: Secondary | ICD-10-CM
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
## 2022-06-27 ENCOUNTER — Encounter: Payer: Self-pay | Admitting: Nurse Practitioner

## 2022-07-13 DEATH — deceased

## 2022-08-18 ENCOUNTER — Ambulatory Visit: Payer: Medicare Other | Admitting: Nurse Practitioner

## 2023-04-16 ENCOUNTER — Encounter: Payer: Medicare Other | Admitting: Nurse Practitioner
